# Patient Record
Sex: Female | Born: 1955 | Race: White | Hispanic: No | Marital: Married | Smoking: Never smoker
Health system: Southern US, Community
[De-identification: ages and names within clinical notes are randomized; demographics above are authoritative.]

## PROBLEM LIST (undated history)

## (undated) ENCOUNTER — Ambulatory Visit: Discharge status: Absent without leave | Payer: Federal, State, Local not specified - PPO

## (undated) DIAGNOSIS — I10 Essential (primary) hypertension: Secondary | ICD-10-CM

## (undated) DIAGNOSIS — R011 Cardiac murmur, unspecified: Secondary | ICD-10-CM

## (undated) DIAGNOSIS — E079 Disorder of thyroid, unspecified: Secondary | ICD-10-CM

## (undated) DIAGNOSIS — K219 Gastro-esophageal reflux disease without esophagitis: Secondary | ICD-10-CM

## (undated) DIAGNOSIS — T7840XA Allergy, unspecified, initial encounter: Secondary | ICD-10-CM

## (undated) DIAGNOSIS — K449 Diaphragmatic hernia without obstruction or gangrene: Secondary | ICD-10-CM

## (undated) DIAGNOSIS — F32A Depression, unspecified: Secondary | ICD-10-CM

## (undated) DIAGNOSIS — N393 Stress incontinence (female) (male): Secondary | ICD-10-CM

## (undated) HISTORY — DX: Gastro-esophageal reflux disease without esophagitis: K21.9

## (undated) HISTORY — DX: Allergy, unspecified, initial encounter: T78.40XA

## (undated) HISTORY — DX: Diaphragmatic hernia without obstruction or gangrene: K44.9

## (undated) HISTORY — PX: TUBAL LIGATION: SHX77

## (undated) HISTORY — DX: Cardiac murmur, unspecified: R01.1

## (undated) HISTORY — PX: OTHER SURGICAL HISTORY: SHX169

## (undated) HISTORY — DX: Essential (primary) hypertension: I10

## (undated) HISTORY — DX: Depression, unspecified: F32.A

## (undated) HISTORY — DX: Disorder of thyroid, unspecified: E07.9

---

## 2001-04-21 HISTORY — PX: ABDOMINAL HYSTERECTOMY: SHX81

## 2001-09-27 DIAGNOSIS — Z90711 Acquired absence of uterus with remaining cervical stump: Secondary | ICD-10-CM | POA: Insufficient documentation

## 2012-01-08 DIAGNOSIS — G4482 Headache associated with sexual activity: Secondary | ICD-10-CM | POA: Insufficient documentation

## 2012-01-08 DIAGNOSIS — K219 Gastro-esophageal reflux disease without esophagitis: Secondary | ICD-10-CM | POA: Insufficient documentation

## 2012-01-08 DIAGNOSIS — N959 Unspecified menopausal and perimenopausal disorder: Secondary | ICD-10-CM | POA: Insufficient documentation

## 2012-01-08 DIAGNOSIS — R1013 Epigastric pain: Secondary | ICD-10-CM | POA: Insufficient documentation

## 2012-01-08 DIAGNOSIS — M159 Polyosteoarthritis, unspecified: Secondary | ICD-10-CM | POA: Insufficient documentation

## 2013-07-20 DIAGNOSIS — R350 Frequency of micturition: Secondary | ICD-10-CM | POA: Insufficient documentation

## 2014-01-05 LAB — CBC WITH AUTOMATED DIFF
ABS. BASOPHILS: 0 10*3/uL (ref 0.0–0.06)
ABS. EOSINOPHILS: 0.1 10*3/uL (ref 0.0–0.4)
ABS. LYMPHOCYTES: 2.2 10*3/uL (ref 0.9–3.6)
ABS. MONOCYTES: 0.5 10*3/uL (ref 0.05–1.2)
ABS. NEUTROPHILS: 2.3 10*3/uL (ref 1.8–8.0)
BASOPHILS: 1 % (ref 0–2)
EOSINOPHILS: 3 % (ref 0–5)
HCT: 38.7 % (ref 35.0–45.0)
HGB: 12.9 g/dL (ref 12.0–16.0)
LYMPHOCYTES: 42 % (ref 21–52)
MCH: 31.4 PG (ref 24.0–34.0)
MCHC: 33.3 g/dL (ref 31.0–37.0)
MCV: 94.2 FL (ref 74.0–97.0)
MONOCYTES: 9 % (ref 3–10)
MPV: 10.4 FL (ref 9.2–11.8)
NEUTROPHILS: 45 % (ref 40–73)
PLATELET: 233 10*3/uL (ref 135–420)
RBC: 4.11 M/uL — ABNORMAL LOW (ref 4.20–5.30)
RDW: 12.8 % (ref 11.6–14.5)
WBC: 5.1 10*3/uL (ref 4.6–13.2)

## 2014-01-05 LAB — METABOLIC PANEL, COMPREHENSIVE
A-G Ratio: 1.3 (ref 0.8–1.7)
ALT (SGPT): 27 U/L (ref 13–56)
AST (SGOT): 16 U/L (ref 15–37)
Albumin: 3.8 g/dL (ref 3.4–5.0)
Alk. phosphatase: 76 U/L (ref 45–117)
Anion gap: 8 mmol/L (ref 3.0–18)
BUN/Creatinine ratio: 15 (ref 12–20)
BUN: 14 MG/DL (ref 7.0–18)
Bilirubin, total: 0.5 MG/DL (ref 0.2–1.0)
CO2: 30 mmol/L (ref 21–32)
Calcium: 9 MG/DL (ref 8.5–10.1)
Chloride: 103 mmol/L (ref 100–108)
Creatinine: 0.91 MG/DL (ref 0.6–1.3)
GFR est AA: >60 mL/min/{1.73_m2} (ref >60)
GFR est non-AA: >60 mL/min/{1.73_m2} (ref >60)
Globulin: 3 g/dL (ref 2.0–4.0)
Glucose: 88 mg/dL (ref 74–99)
Potassium: 4.7 mmol/L (ref 3.5–5.5)
Protein, total: 6.8 g/dL (ref 6.4–8.2)
Sodium: 141 mmol/L (ref 136–145)

## 2014-01-05 NOTE — Other (Signed)
Sage givenDenies any prostheticsPatient states that the family physician is not aware of upcoming procedureDenies sleep apneaDenies family history of anesthesia complicationsDenies shortness of breath nor chest pain while climbing stairs

## 2014-01-13 NOTE — H&P (View-Only) (Signed)
This 58 year old female presents for incontinence.      History of Present Illness:  1.  incontinence   here for problems with incontience, recently went to PCP and was told she had infection, since taking antibotics leaking has improved. States she is still having leaking when walking or excercising. She has had multiple treatments for UTI, but two of the cultures were negative.                Gynecologic History:  Patient is postmenopausal.   Postmenopausal age: 58. Type of menopause is hysterectomy w/BSO .  Date of last PAP: 12/29/2012.   Past Systemic History    Medical History (Detailed)  Disease Onset Date Comments   neck surgery     shoulder surgery       Surgical History/Management (Detailed)  Management Date Comments   Hysterectomy     Diagnostics History:  ActStatus Study Ordered Completed Interpretation  Result / Report   completed * Dexa, Bone Density Scan  05/04/2007 abnormal  Osteopenia   completed * Screening mammography digital  07/11/2013 normal     completed COLONOSCOPY AND BIOPSY  01/29/2009 low  repeat in 5 years   completed MAMMOGRAM, BOTH BREASTS  07/09/2010 normal     completed VISUAL ACUITY SCREEN  07/07/2012 normal     obtained Wet prep / KOH 10/28/2013             PROBLEM LIST:    Problem Description Onset Date   Increased frequency of urination 07/20/2013   Pure hypercholesterolemia 01/08/2012   Gastroesophageal reflux disease 01/08/2012   Degenerative joint disease involving multiple joints 01/08/2012   Epigastric pain 01/08/2012   Menopausal and postmenopausal disorders 01/08/2012   Joint pain 01/08/2012   Palpitations 01/08/2012   Headache associated with sexual activity 01/08/2012       Medication Reconciliation  Medications reconciled today.    Allergies:  Ingredient Reaction Medication Name Comment   NO KNOWN ALLERGIES          Family History  (Detailed)    Relationship Family Member Name Deceased Age at Death Condition Onset Age Cause of Death   Father  Y  Heart disease  Y    Father  Y    N   Mother  Y  Hypertension  Y   Mother  Y    N   Social History:  (Detailed)  The patient is right-handed.    Preferred language is English.     The patient does not need an interpreter.    Tobacco use status: Never smoked tobacco.  Smoking status: Never smoker.          Review of Systems  System Neg/Pos Details   Constitutional Negative Fever.   Respiratory Negative Dyspnea.   Cardio Negative Chest pain.   GI Negative Abdominal pain.   GU Positive Urinary frequency, Urinary incontinence.   GU Negative Dysuria and hematuria.   Endocrine Negative Cold intolerance and heat intolerance.   Neuro Negative Headache.   Psych Negative Anxiety and depression.         Vital Signs       Time BP mm/Hg Pulse /min Resp /min Temp F Ht ft Ht in Ht cm Wt lb Wt kg BMI kg/m2 BSA m2 O2 Sat%   10:49 AM 122/76    5.0 7.00 170.18 179.00 81.193 28.04       Measured By  Time Measured by   10:49 AM Sarah Mazzone           Physical Exam  Exam Findings Details   Constitutional Normal Well developed.   Respiratory Normal Inspection - Normal.   Abdomen Normal Anterior palpation -  No rebound. No abdominal tenderness. No hernia.   Genitourinary * Urethral hypermobility - The cotton swab rises 10 degrees at rest, straining the swab rises 40 degrees from resting angle. The stress test is positive. The tests were performed with the bladder full. Cervix - surgically absent. Uterus - surgically absent.   Genitourinary Normal Urethral meatus - Normal. Urethra - Normal. External genitalia - Normal. Glands - Normal. Perineum - Normal. Anus - Normal. Pubic hair - Normal. Vagina - Normal. Adnexa - Normal. Bladder - Normal. No suprapubic tenderness. No CVA tenderness. No vaginal discharge.   Skin Normal Inspection - Normal.   Spine * Inspection - no abnormality.   Psychiatric Normal Oriented to time, place, person and situation. Appropriate mood and affect.       Assessment/Plan  # Detail Type Description     1. Assessment Menopausal or female climacteric states (627.2).    Patient Plan Patient wants to go back on ERT. ERX sent.         2. Assessment Stress incontinence, female (625.6).    Patient Plan Hypermobile urethra. + leak, - culture. Will schedule for TVT-O.   Discussed risks benefits alternatives of surgery and patient wants to proceed.         3. Assessment Urinary frequency (788.41).    Patient Plan Continue oxybutnin.

## 2014-01-13 NOTE — H&P (Signed)
This 58 year old female presents for incontinence.      History of Present Illness:  1.  incontinence   here for problems with incontience, recently went to PCP and was told she had infection, since taking antibotics leaking has improved. States she is still having leaking when walking or excercising. She has had multiple treatments for UTI, but two of the cultures were negative.                Gynecologic History:  Patient is postmenopausal.   Postmenopausal age: 58. Type of menopause is hysterectomy w/BSO .  Date of last PAP: 12/29/2012.   Past Systemic History    Medical History (Detailed)  Disease Onset Date Comments   neck surgery     shoulder surgery       Surgical History/Management (Detailed)  Management Date Comments   Hysterectomy     Diagnostics History:  ActStatus Study Ordered Completed Interpretation  Result / Report   completed * Dexa, Bone Density Scan  05/04/2007 abnormal  Osteopenia   completed * Screening mammography digital  07/11/2013 normal     completed COLONOSCOPY AND BIOPSY  01/29/2009 low  repeat in 5 years   completed MAMMOGRAM, BOTH BREASTS  07/09/2010 normal     completed VISUAL ACUITY SCREEN  07/07/2012 normal     obtained Wet prep / KOH 10/28/2013             PROBLEM LIST:    Problem Description Onset Date   Increased frequency of urination 07/20/2013   Pure hypercholesterolemia 01/08/2012   Gastroesophageal reflux disease 01/08/2012   Degenerative joint disease involving multiple joints 01/08/2012   Epigastric pain 01/08/2012   Menopausal and postmenopausal disorders 01/08/2012   Joint pain 01/08/2012   Palpitations 01/08/2012   Headache associated with sexual activity 01/08/2012       Medication Reconciliation  Medications reconciled today.    Allergies:  Ingredient Reaction Medication Name Comment   NO KNOWN ALLERGIES          Family History  (Detailed)    Relationship Family Member Name Deceased Age at Death Condition Onset Age Cause of Death   Father  Y  Heart disease  Y    Father  Y    N   Mother  Y  Hypertension  Y   Mother  Y    N   Social History:  (Detailed)  The patient is right-handed.    Preferred language is [place].     The patient does not need an interpreter.    Tobacco use status: Never smoked tobacco.  Smoking status: Never smoker.          Review of Systems  System Neg/Pos Details   Constitutional Negative Fever.   Respiratory Negative Dyspnea.   Cardio Negative Chest pain.   GI Negative Abdominal pain.   GU Positive Urinary frequency, Urinary incontinence.   GU Negative Dysuria and hematuria.   Endocrine Negative Cold intolerance and heat intolerance.   Neuro Negative Headache.   Psych Negative Anxiety and depression.         Vital Signs       Time BP mm/Hg Pulse /min Resp /min Temp F Ht ft Ht in Ht cm Wt lb Wt kg BMI kg/m2 BSA m2 O2 Sat%   10:49 AM 122/76    5.0 7.00 170.18 179.00 81.193 28.04       Measured By  Time Measured by   10:49 AM Foye Clock  Physical Exam  Exam Findings Details   Constitutional Normal Well developed.   Respiratory Normal Inspection - Normal.   Abdomen Normal Anterior palpation -  No rebound. No abdominal tenderness. No hernia.   Genitourinary * Urethral hypermobility - The cotton swab rises 10 degrees at rest, straining the swab rises 40 degrees from resting angle. The stress test is positive. The tests were performed with the bladder full. Cervix - surgically absent. Uterus - surgically absent.   Genitourinary Normal Urethral meatus - Normal. Urethra - Normal. External genitalia - Normal. Glands - Normal. Perineum - Normal. Anus - Normal. Pubic hair - Normal. Vagina - Normal. Adnexa - Normal. Bladder - Normal. No suprapubic tenderness. No CVA tenderness. No vaginal discharge.   Skin Normal Inspection - Normal.   Spine * Inspection - no abnormality.   Psychiatric Normal Oriented to time, place, person and situation. Appropriate mood and affect.       Assessment/Plan  # Detail Type Description     1. Assessment Menopausal or female climacteric states (627.2).    Patient Plan Patient wants to go back on ERT. ERX sent.         2. Assessment Stress incontinence, female (625.6).    Patient Plan Hypermobile urethra. + leak, - culture. Will schedule for TVT-O.   Discussed risks benefits alternatives of surgery and patient wants to proceed.         3. Assessment Urinary frequency (788.41).    Patient Plan Continue oxybutnin.

## 2014-01-16 ENCOUNTER — Inpatient Hospital Stay: Payer: BLUE CROSS/BLUE SHIELD

## 2014-01-16 MED ORDER — NALOXONE 0.4 MG/ML INJECTION
0.4 mg/mL | INTRAMUSCULAR | Status: DC | PRN
Start: 2014-01-16 — End: 2014-01-16

## 2014-01-16 MED ORDER — KETAMINE 10 MG/ML IJ SOLN
10 mg/mL | INTRAMUSCULAR | Status: DC | PRN
Start: 2014-01-16 — End: 2014-01-16
  Administered 2014-01-16: 15:00:00 via INTRAVENOUS

## 2014-01-16 MED ORDER — BUPIVACAINE (PF) 0.25 % (2.5 MG/ML) IJ SOLN
0.25 % (2.5 mg/mL) | INTRAMUSCULAR | Status: AC
Start: 2014-01-16 — End: ?

## 2014-01-16 MED ORDER — OXYCODONE-ACETAMINOPHEN 5 MG-325 MG TAB
5-325 mg | ORAL_TABLET | ORAL | Status: AC | PRN
Start: 2014-01-16 — End: ?

## 2014-01-16 MED ORDER — CAFFEINE 200 MG TAB
200 mg | Freq: Once | ORAL | Status: AC
Start: 2014-01-16 — End: 2014-01-16
  Administered 2014-01-16: 14:00:00 via ORAL

## 2014-01-16 MED ORDER — FENTANYL CITRATE (PF) 50 MCG/ML IJ SOLN
50 mcg/mL | INTRAMUSCULAR | Status: DC | PRN
Start: 2014-01-16 — End: 2014-01-16
  Administered 2014-01-16: 15:00:00 via INTRAVENOUS

## 2014-01-16 MED ORDER — PROPOFOL 10 MG/ML IV EMUL
10 mg/mL | INTRAVENOUS | Status: DC | PRN
Start: 2014-01-16 — End: 2014-01-16
  Administered 2014-01-16 (×3): via INTRAVENOUS

## 2014-01-16 MED ORDER — ACETAMINOPHEN 1,000 MG/100 ML (10 MG/ML) IV
1000 mg/100 mL (10 mg/mL) | Freq: Once | INTRAVENOUS | Status: DC | PRN
Start: 2014-01-16 — End: 2014-01-16

## 2014-01-16 MED ORDER — SODIUM CHLORIDE 0.9 % IV
INTRAVENOUS | Status: DC
Start: 2014-01-16 — End: 2014-01-16

## 2014-01-16 MED ORDER — LIDOCAINE (PF) 20 MG/ML (2 %) IJ SOLN
20 mg/mL (2 %) | INTRAMUSCULAR | Status: DC | PRN
Start: 2014-01-16 — End: 2014-01-16
  Administered 2014-01-16: 15:00:00 via INTRAVENOUS

## 2014-01-16 MED ORDER — FENTANYL CITRATE (PF) 50 MCG/ML IJ SOLN
50 mcg/mL | INTRAMUSCULAR | Status: DC | PRN
Start: 2014-01-16 — End: 2014-01-16

## 2014-01-16 MED ORDER — DEXAMETHASONE SODIUM PHOSPHATE 4 MG/ML IJ SOLN
4 mg/mL | INTRAMUSCULAR | Status: DC | PRN
Start: 2014-01-16 — End: 2014-01-16
  Administered 2014-01-16: 15:00:00 via INTRAVENOUS

## 2014-01-16 MED ORDER — BUPIVACAINE (PF) 0.25 % (2.5 MG/ML) IJ SOLN
0.25 % (2.5 mg/mL) | INTRAMUSCULAR | Status: DC | PRN
Start: 2014-01-16 — End: 2014-01-16
  Administered 2014-01-16: 16:00:00

## 2014-01-16 MED ORDER — CEFAZOLIN 2 GM/50 ML IN DEXTROSE (ISO-OSMOTIC) IVPB
2 gram/50 mL | Freq: Once | INTRAVENOUS | Status: AC
Start: 2014-01-16 — End: 2014-01-16
  Administered 2014-01-16: 15:00:00 via INTRAVENOUS

## 2014-01-16 MED ORDER — ONDANSETRON (PF) 4 MG/2 ML INJECTION
4 mg/2 mL | INTRAMUSCULAR | Status: DC | PRN
Start: 2014-01-16 — End: 2014-01-16
  Administered 2014-01-16: 15:00:00 via INTRAVENOUS

## 2014-01-16 MED ORDER — MIDAZOLAM 1 MG/ML IJ SOLN
1 mg/mL | INTRAMUSCULAR | Status: AC
Start: 2014-01-16 — End: ?

## 2014-01-16 MED ORDER — INSULIN LISPRO 100 UNIT/ML INJECTION
100 unit/mL | SUBCUTANEOUS | Status: DC
Start: 2014-01-16 — End: 2014-01-16

## 2014-01-16 MED ORDER — DEXAMETHASONE SODIUM PHOSPHATE 4 MG/ML IJ SOLN
4 mg/mL | Freq: Once | INTRAMUSCULAR | Status: AC
Start: 2014-01-16 — End: 2014-01-16
  Administered 2014-01-16: 14:00:00 via INTRAVENOUS

## 2014-01-16 MED ORDER — LACTATED RINGERS IV
INTRAVENOUS | Status: DC
Start: 2014-01-16 — End: 2014-01-16
  Administered 2014-01-16 (×2): via INTRAVENOUS

## 2014-01-16 MED ORDER — KETAMINE 50 MG/5 ML (10 MG/ML) IN NS IV SYRINGE
50 mg/5 mL (10 mg/mL) | INTRAVENOUS | Status: AC
Start: 2014-01-16 — End: ?

## 2014-01-16 MED ORDER — POLYETHYLENE GLYCOL 3350 17 GRAM (100 %) ORAL POWDER PACKET
17 gram | PACK | Freq: Every day | ORAL | Status: AC
Start: 2014-01-16 — End: ?

## 2014-01-16 MED ORDER — MIDAZOLAM 1 MG/ML IJ SOLN
1 mg/mL | INTRAMUSCULAR | Status: DC | PRN
Start: 2014-01-16 — End: 2014-01-16
  Administered 2014-01-16: 15:00:00 via INTRAVENOUS

## 2014-01-16 MED ORDER — GLYCOPYRROLATE 0.2 MG/ML IJ SOLN
0.2 mg/mL | INTRAMUSCULAR | Status: DC | PRN
Start: 2014-01-16 — End: 2014-01-16
  Administered 2014-01-16: 15:00:00 via INTRAVENOUS

## 2014-01-16 MED ORDER — FENTANYL CITRATE (PF) 50 MCG/ML IJ SOLN
50 mcg/mL | INTRAMUSCULAR | Status: AC
Start: 2014-01-16 — End: ?

## 2014-01-16 MED ORDER — HYDROMORPHONE 2 MG/ML INJECTION SOLUTION
2 mg/mL | INTRAMUSCULAR | Status: DC | PRN
Start: 2014-01-16 — End: 2014-01-16

## 2014-01-16 MED ORDER — IBUPROFEN 800 MG TAB
800 mg | ORAL_TABLET | Freq: Three times a day (TID) | ORAL | Status: AC | PRN
Start: 2014-01-16 — End: ?

## 2014-01-16 MED ORDER — DOCUSATE SODIUM 100 MG CAP
100 mg | ORAL_CAPSULE | Freq: Two times a day (BID) | ORAL | Status: AC
Start: 2014-01-16 — End: 2014-04-16

## 2014-01-16 MED ORDER — METOCLOPRAMIDE 5 MG/ML IJ SOLN
5 mg/mL | INTRAMUSCULAR | Status: DC | PRN
Start: 2014-01-16 — End: 2014-01-16
  Administered 2014-01-16: 16:00:00 via INTRAVENOUS

## 2014-01-16 MED ORDER — SODIUM CHLORIDE 0.9 % IJ SYRG
INTRAMUSCULAR | Status: DC | PRN
Start: 2014-01-16 — End: 2014-01-16

## 2014-01-16 NOTE — Other (Signed)
TRANSFER - IN REPORT:    Verbal report received from Joe CRNA and Gladstone Lighterobin Taylor RN on Renee RamusJacqueline D Mcelmurry  being received from OR for routine progression of care      Report consisted of patient???s Situation, Background, Assessment and   Recommendations(SBAR).     Information from the following report(s) OR Summary, Procedure Summary and Intake/Output was reviewed with the receiving nurse.    Opportunity for questions and clarification was provided.      Assessment completed upon patient???s arrival to unit and care assumed.

## 2014-01-16 NOTE — Other (Signed)
Patient's husband updated by Ezequiel GanserKellie Caudle RN, and sent down stairs to Phase II.

## 2014-01-16 NOTE — Other (Signed)
Pt. Used restroom in pre-op area.

## 2014-01-16 NOTE — Other (Signed)
TRANSFER - OUT REPORT:    Verbal report given to [name] on Natalie Ryan  being transferred to Phase II for routine progression of care       Report consisted of patient???s Situation, Background, Assessment and   Recommendations(SBAR).     Information from the following report(s) OR Summary, Procedure Summary and Intake/Output was reviewed with the receiving nurse.    Lines:   Peripheral IV 01/16/14 Left Hand (Active)   Site Assessment Clean, dry, & intact 01/16/2014 12:22 PM   Phlebitis Assessment 0 01/16/2014 12:22 PM   Infiltration Assessment 0 01/16/2014 12:22 PM   Dressing Status Clean, dry, & intact 01/16/2014 12:22 PM   Dressing Type Tape;Transparent 01/16/2014 12:22 PM   Hub Color/Line Status Pink;Infusing 01/16/2014 12:22 PM        Opportunity for questions and clarification was provided.      Patient transported with:   [employer]

## 2014-01-16 NOTE — Anesthesia Post-Procedure Evaluation (Signed)
Post-Anesthesia Evaluation and Assessment    Cardiovascular Function/Vital Signs  Visit Vitals   Item Reading   ??? BP 131/74 mmHg   ??? Pulse 52   ??? Temp 36.2 ??C (97.1 ??F)   ??? Resp 13   ??? Ht _0  (1.702 m)   ??? Wt 80.06 kg (176 lb 8 oz)   ??? BMI 27.64 kg/m2   ??? SpO2 100%       Patient is status post Procedure(s):  TENSION FREE VAGINAL TAPING-0.    Nausea/Vomiting: Controlled.    Postoperative hydration reviewed and adequate.    Pain:  Pain Scale 1: Numeric (0 - 10) (01/16/14 1216)  Pain Intensity 1: 0 (01/16/14 1216)   Managed.    Neurological Status:   Neuro (WDL): Within Defined Limits (01/16/14 1216)   At baseline.    Mental Status and Level of Consciousness: Arousable.    Pulmonary Status:   O2 Device: Room air (01/16/14 1216)   Adequate oxygenation and airway patent.    Complications related to anesthesia: None    Post-anesthesia assessment completed. No concerns.    Patient has met all discharge requirements.    Signed By: Everett Graff, MD    January 16, 2014

## 2014-01-16 NOTE — Op Note (Signed)
PREOPERATIVE DIAGNOSIS  Stress urinary incontinence, Hypermobile urethra    POSTOPERATIVE DIAGNOSIS  Stress urinary incontinence, Hypermobile urethra    PROCEDURE(S) PERFORMED  Transobturator urethral sling (TVT-O)                              ANESTHESIA  General with LMA.    FINDINGS  Hypermobile urethra    COMPLICATIONS  None.    ESTIMATED BLOOD LOSS  Minimal.    SPECIMENS  None    PROCEDURE  The patient was taken to the operating room where she was placed in the supine position. After being placed under general anesthesia, she was placed up in the [facility] [place] laparoscopy stirrups in the dorsal lithotomy position .The patient was then prepped and draped in the usual fashion. Time out was completed. Attention was turned to the vagina where the speculum was placed in the vagina. The uretheral orifice was grasped with an Allis clamp. Marcaine was injected in the midline immediately beneath the urethral orifice. A vertical incision was made in the midline beneath the urethrovesical junction. The right side of the incision was sharply dissected beneath the urethra towards the right obturator foramen. The TVT-O guide was placed through the obturator foramen. The TVT-O trocar was placed into the incision on top of the guide and the guide was removed. The trocar was brought through the skin of the groin and the sheath was held while the trocar was removed. The TVT-O mesh was brought to the midline. The same procedure was used on the left. A Mayo scissors was placed beneath the sheath and the mesh was brought into the proper position. The trocars were cut from the the mesh and the mesh sheath was removed. The mesh arms were cut at the skin edges. The vaginal incision was closed with 3-0 Vicryl running. All instruments were removed from the vagina. Excellent hemostasis was noted. The skin incisions were closed with Dermabond. The patient was awakened, extubated and taken to the recovery room in stable condition.

## 2014-01-16 NOTE — Interval H&P Note (Signed)
H&P Update:  Natalie Ryan was seen and examined.  History and physical has been reviewed. There have been no significant clinical changes since the completion of the originally dated History and Physical.    Signed By: Barbee CoughJEFFREY D Aspynn Clover, MD     January 16, 2014 10:38 AM

## 2014-01-16 NOTE — Other (Signed)
Notified Cari C. rn that there is no order for consent.

## 2014-01-16 NOTE — Anesthesia Pre-Procedure Evaluation (Addendum)
Anesthetic History     PONV          Review of Systems / Medical History  Patient summary reviewed, nursing notes reviewed and pertinent labs reviewed    Pulmonary  Within defined limits            Pertinent negatives: No asthma and smoker     Neuro/Psych         Psychiatric history     Cardiovascular  Within defined limits              Pertinent negatives: No hypertension       GI/Hepatic/Renal     GERD: well controlled        Pertinent negatives: No liver disease and renal disease   Endo/Other  Within defined limits        Pertinent negatives: No diabetes   Other Findings            Physical Exam    Airway  Mallampati: II  TM Distance: < 4 cm  Neck ROM: normal range of motion   Mouth opening: Normal     Cardiovascular  Regular rate and rhythm,  S1 and S2 normal,  no murmur, click, rub, or gallop  Rhythm: regular  Rate: normal         Dental    Dentition: Caps/crowns     Pulmonary  Breath sounds clear to auscultation               Abdominal         Other Findings            Anesthetic Plan    ASA: 1  Anesthesia type: general          Induction: Intravenous  Anesthetic plan and risks discussed with: Patient

## 2014-01-16 NOTE — Other (Signed)
Voided 600 ml pink tinged urine

## 2014-01-16 NOTE — Op Note (Signed)
PREOPERATIVE DIAGNOSIS  Stress urinary incontinence, Hypermobile urethra    POSTOPERATIVE DIAGNOSIS  Stress urinary incontinence, Hypermobile urethra    PROCEDURE(S) PERFORMED  Transobturator urethral sling (TVT-O)                              ANESTHESIA  General with LMA.    FINDINGS  Hypermobile urethra    COMPLICATIONS  None.    ESTIMATED BLOOD LOSS  Minimal.    SPECIMENS  None    PROCEDURE  The patient was taken to the operating room where she was placed in the supine position. After being placed under general anesthesia, she was placed up in the [place] laparoscopy stirrups in the dorsal lithotomy position .The patient was then prepped and draped in the usual fashion. Time out was completed. Attention was turned to the vagina where the speculum was placed in the vagina. The uretheral orifice was grasped with an Allis clamp. Marcaine was injected in the midline immediately beneath the urethral orifice. A vertical incision was made in the midline beneath the urethrovesical junction. The right side of the incision was sharply dissected beneath the urethra towards the right obturator foramen. The TVT-O guide was placed through the obturator foramen. The TVT-O trocar was placed into the incision on top of the guide and the guide was removed. The trocar was brought through the skin of the groin and the sheath was held while the trocar was removed. The TVT-O mesh was brought to the midline. The same procedure was used on the left. A Mayo scissors was placed beneath the sheath and the mesh was brought into the proper position. The trocars were cut from the the mesh and the mesh sheath was removed. The mesh arms were cut at the skin edges. The vaginal incision was closed with 3-0 Vicryl running. All instruments were removed from the vagina. Excellent hemostasis was noted. The skin incisions were closed with Dermabond. The patient was awakened, extubated and taken to the recovery room in stable condition.

## 2014-01-17 MED FILL — DEXAMETHASONE SODIUM PHOSPHATE 4 MG/ML IJ SOLN: 4 mg/mL | INTRAMUSCULAR | Qty: 2

## 2015-07-05 DIAGNOSIS — H5213 Myopia, bilateral: Secondary | ICD-10-CM | POA: Insufficient documentation

## 2015-09-27 DIAGNOSIS — E041 Nontoxic single thyroid nodule: Secondary | ICD-10-CM | POA: Insufficient documentation

## 2015-09-27 HISTORY — DX: Nontoxic single thyroid nodule: E04.1

## 2016-01-10 DIAGNOSIS — R1313 Dysphagia, pharyngeal phase: Secondary | ICD-10-CM | POA: Insufficient documentation

## 2016-07-07 DIAGNOSIS — H524 Presbyopia: Secondary | ICD-10-CM | POA: Insufficient documentation

## 2016-10-19 HISTORY — PX: THYROIDECTOMY, PARTIAL: SHX18

## 2016-11-18 DIAGNOSIS — E890 Postprocedural hypothyroidism: Secondary | ICD-10-CM | POA: Insufficient documentation

## 2018-02-03 DIAGNOSIS — E890 Postprocedural hypothyroidism: Secondary | ICD-10-CM | POA: Insufficient documentation

## 2019-02-20 HISTORY — PX: SHOULDER SURGERY: SHX246

## 2019-04-22 HISTORY — PX: COLONOSCOPY: SHX174

## 2020-01-10 ENCOUNTER — Ambulatory Visit (INDEPENDENT_AMBULATORY_CARE_PROVIDER_SITE_OTHER): Payer: BLUE CROSS/BLUE SHIELD | Admitting: Internal Medicine

## 2020-01-10 ENCOUNTER — Other Ambulatory Visit: Payer: Self-pay

## 2020-01-10 ENCOUNTER — Encounter: Payer: Self-pay | Admitting: Internal Medicine

## 2020-01-10 VITALS — BP 122/78 | HR 65 | Temp 97.3°F | Resp 18 | Ht 67.0 in | Wt 192.4 lb

## 2020-01-10 DIAGNOSIS — Z9009 Acquired absence of other part of head and neck: Secondary | ICD-10-CM

## 2020-01-10 DIAGNOSIS — F3341 Major depressive disorder, recurrent, in partial remission: Secondary | ICD-10-CM | POA: Insufficient documentation

## 2020-01-10 DIAGNOSIS — Z780 Asymptomatic menopausal state: Secondary | ICD-10-CM

## 2020-01-10 DIAGNOSIS — E890 Postprocedural hypothyroidism: Secondary | ICD-10-CM

## 2020-01-10 DIAGNOSIS — E669 Obesity, unspecified: Secondary | ICD-10-CM | POA: Insufficient documentation

## 2020-01-10 DIAGNOSIS — Z23 Encounter for immunization: Secondary | ICD-10-CM | POA: Diagnosis not present

## 2020-01-10 DIAGNOSIS — R232 Flushing: Secondary | ICD-10-CM

## 2020-01-10 DIAGNOSIS — F329 Major depressive disorder, single episode, unspecified: Secondary | ICD-10-CM

## 2020-01-10 DIAGNOSIS — Z7689 Persons encountering health services in other specified circumstances: Secondary | ICD-10-CM | POA: Diagnosis not present

## 2020-01-10 DIAGNOSIS — F32A Depression, unspecified: Secondary | ICD-10-CM

## 2020-01-10 DIAGNOSIS — K219 Gastro-esophageal reflux disease without esophagitis: Secondary | ICD-10-CM

## 2020-01-10 DIAGNOSIS — F320 Major depressive disorder, single episode, mild: Secondary | ICD-10-CM | POA: Diagnosis not present

## 2020-01-10 MED ORDER — FLUOXETINE HCL 20 MG PO CAPS: 20.0000 mg | ORAL_CAPSULE | Freq: Every day | ORAL | 3 refills | Status: DC

## 2020-01-10 MED ORDER — LEVOTHYROXINE SODIUM 75 MCG PO TABS: 75.0000 ug | ORAL_TABLET | Freq: Every day | ORAL | 3 refills | Status: DC

## 2020-01-10 NOTE — Assessment & Plan Note (Addendum)
Due to h/o multinodular goiter Takes Levothyroxine 75 mcg QD Reviewed labs from 08/2019, TSH wnl Will recheck TSH and T4

## 2020-01-10 NOTE — Assessment & Plan Note (Signed)
Takes Omeprazole Advised to avoid PPI, start Pepcid

## 2020-01-10 NOTE — Assessment & Plan Note (Signed)
Diet modification advised Moderate exercise and plan for activity modification discussed 

## 2020-01-10 NOTE — Assessment & Plan Note (Signed)
Uses estradiol patch for long time 

## 2020-01-10 NOTE — Patient Instructions (Signed)
Please continue to take medications as prescribed.  Please get blood tests done and follow up in 4 months.  Please follow low-carb and low cholesterol diet as discussed. Moderate exercise as tolerated.

## 2020-01-10 NOTE — Assessment & Plan Note (Signed)
Well-controlled with Fluoxetine 

## 2020-01-10 NOTE — Assessment & Plan Note (Addendum)
Care established History and medications reviewed DEXA scan ordered

## 2020-01-10 NOTE — Progress Notes (Signed)
New Patient Office Visit  Subjective:  Patient ID: Molly Knight, female    DOB: 08-04-1955  Age: 64 y.o. MRN: [id number]  CC:  Chief Complaint  Patient presents with  . New Patient (Initial Visit)    new patient former pcp in [place] dr Maralyn Sago young only concern is getting her thyroid medication refilled and labs rechecked     HPI Molly Knight is a 64 year old female who presents for establishing care.  Patient has a PCP in [place], last visit about 4 months ago. Patient has moved to this area now. Today, patient denies any active complaints and states that she is tolerating her medications well.  Patient has a history of partial thyroidectomy for multinodular goiter long time ago, after which she has been taking levothyroxine 75 mcg daily.  Patient's sister had colon cancer at age 80.  Patient gets colonoscopy every 5 years, last colonoscopy was done in 2021.  Patient had a normal mammogram in 06/2018.  Patient denies having DEXA scan for long time.  Patient is estradiol patch for hot flashes.  History reviewed. No pertinent past medical history.  Past Surgical History:  Procedure Laterality Date  . ABDOMINAL HYSTERECTOMY  2003  . SHOULDER SURGERY  02/2019  . THYROIDECTOMY, PARTIAL  10/2016    Family History  Problem Relation Age of Onset  . Aortic aneurysm Mother   . Colon cancer Sister   . Throat cancer Brother     Social History   Socioeconomic History  . Marital status: Married    Spouse name: Not on file  . Number of children: Not on file  . Years of education: Not on file  . Highest education level: Not on file  Occupational History  . Not on file  Tobacco Use  . Smoking status: Never Smoker  . Smokeless tobacco: Never Used  Substance and Sexual Activity  . Alcohol use: Never  . Drug use: Never  . Sexual activity: Not on file  Other Topics Concern  . Not on file  Social History Narrative  . Not on file   Social Determinants of Health    Financial Resource Strain: Low Risk   . Difficulty of Paying Living Expenses: Not hard at all  Food Insecurity: No Food Insecurity  . Worried About Programme researcher, broadcasting/film/video in the Last Year: Never true  . Ran Out of Food in the Last Year: Never true  Transportation Needs: No Transportation Needs  . Lack of Transportation (Medical): No  . Lack of Transportation (Non-Medical): No  Physical Activity: Insufficiently Active  . Days of Exercise per Week: 3 days  . Minutes of Exercise per Session: 30 min  Stress: No Stress Concern Present  . Feeling of Stress : Not at all  Social Connections: Moderately Isolated  . Frequency of Communication with Friends and Family: More than three times a week  . Frequency of Social Gatherings with Friends and Family: More than three times a week  . Attends Religious Services: Never  . Active Member of Clubs or Organizations: No  . Attends Banker Meetings: Never  . Marital Status: Married  Catering manager Violence: Not At Risk  . Fear of Current or Ex-Partner: No  . Emotionally Abused: No  . Physically Abused: No  . Sexually Abused: No    ROS Review of Systems  Constitutional: Negative for chills and fever.  HENT: Negative for congestion, sinus pressure, sinus pain and sore throat.   Eyes: Negative for pain and discharge.  Respiratory: Negative for cough and shortness of breath.   Cardiovascular: Negative for chest pain and palpitations.  Gastrointestinal: Negative for abdominal pain, constipation, diarrhea, nausea and vomiting.  Endocrine: Negative for polydipsia and polyuria.  Genitourinary: Negative for dysuria and hematuria.  Musculoskeletal: Negative for neck pain and neck stiffness.  Skin: Negative for rash.  Neurological: Negative for dizziness and weakness.  Psychiatric/Behavioral: Negative for agitation and behavioral problems.    Objective:   Today's Vitals: BP 122/78 (BP Location: Left Arm, Patient Position: Sitting,  Cuff Size: Normal)   Pulse 65   Temp (!) 97.3 F (36.3 C) (Temporal)   Resp 18   Ht 5\' 7"  (1.702 m)   Wt 192 lb 6.4 oz (87.3 kg)   LMP  (LMP Unknown)   SpO2 96%   BMI 30.13 kg/m   Physical Exam Vitals reviewed.  Constitutional:      General: She is not in acute distress.    Appearance: She is not diaphoretic.  HENT:     Head: Normocephalic and atraumatic.     Nose: Nose normal.     Mouth/Throat:     Mouth: Mucous membranes are moist.  Eyes:     General: No scleral icterus.    Extraocular Movements: Extraocular movements intact.     Pupils: Pupils are equal, round, and reactive to light.  Cardiovascular:     Rate and Rhythm: Normal rate and regular rhythm.     Pulses: Normal pulses.     Heart sounds: No murmur heard.   Pulmonary:     Breath sounds: Normal breath sounds. No wheezing or rales.  Abdominal:     Palpations: Abdomen is soft.     Tenderness: There is no abdominal tenderness.  Musculoskeletal:     Cervical back: Neck supple. No tenderness.     Right lower leg: No edema.     Left lower leg: No edema.  Skin:    General: Skin is warm.     Findings: No rash.  Neurological:     General: No focal deficit present.     Mental Status: She is alert and oriented to person, place, and time.  Psychiatric:        Mood and Affect: Mood normal.        Behavior: Behavior normal.     Assessment & Plan:   Encounter to establish care Care established History and medications reviewed DEXA scan ordered Diet and activity modification for healthy lifestyle discussed  History of partial thyroidectomy Due to h/o multinodular goiter Takes Levothyroxine 75 mcg QD Reviewed labs from 08/2019, TSH wnl Will recheck TSH and T4  Depression Well-controlled with Fluoxetine  GERD (gastroesophageal reflux disease) Takes Omeprazole Advised to avoid PPI, start Pepcid  Hot flashes Uses estradiol patch for long time  Obesity Diet modification advised Moderate exercise and  plan for activity modification discussed   Flu shot administered in office today   Outpatient Encounter Medications as of 01/10/2020  Medication Sig  . estradiol (VIVELLE-DOT) 0.05 MG/24HR patch 1 patch 2 (two) times a week.  01/12/2020 FLUoxetine (PROZAC) 20 MG capsule Take 1 capsule (20 mg total) by mouth daily.  Marland Kitchen levothyroxine (SYNTHROID) 75 MCG tablet Take 1 tablet (75 mcg total) by mouth daily before breakfast.  . metroNIDAZOLE (METROGEL) 1 % gel SMARTSIG:1 Topical Every Night  . Multiple Vitamins-Minerals (MULTI VITAMIN/MINERALS) TABS Take 1 tablet by mouth daily.  Marland Kitchen omeprazole (PRILOSEC) 20 MG capsule Take 20 mg by mouth daily.  . [DISCONTINUED] FLUoxetine (PROZAC) 20  MG capsule 20 mg.  . [DISCONTINUED] levothyroxine (SYNTHROID) 75 MCG tablet Take 75 mcg by mouth.   No facility-administered encounter medications on file as of 01/10/2020.    Follow-up: Return in about 4 months (around 05/11/2020).   Anabel Halon, MD

## 2020-01-11 ENCOUNTER — Telehealth: Payer: Self-pay | Admitting: Radiology

## 2020-01-11 NOTE — Telephone Encounter (Signed)
Patient called she has asked if the DEXA scan is preventative or diagnostic   She needs to know for insurance purposes

## 2020-01-12 NOTE — Telephone Encounter (Signed)
Patient was called to notify it is for diagnosis of osteoporosis.

## 2020-01-19 ENCOUNTER — Ambulatory Visit (HOSPITAL_COMMUNITY)
Admission: RE | Admit: 2020-01-19 | Discharge: 2020-01-19 | Disposition: A | Discharge status: Home / Self Care | Payer: BLUE CROSS/BLUE SHIELD | Source: Ambulatory Visit | Attending: Internal Medicine | Admitting: Internal Medicine

## 2020-01-19 ENCOUNTER — Other Ambulatory Visit: Payer: Self-pay

## 2020-01-19 DIAGNOSIS — Z780 Asymptomatic menopausal state: Secondary | ICD-10-CM | POA: Insufficient documentation

## 2020-01-19 DIAGNOSIS — M8589 Other specified disorders of bone density and structure, multiple sites: Secondary | ICD-10-CM | POA: Diagnosis not present

## 2020-04-08 ENCOUNTER — Other Ambulatory Visit: Payer: Self-pay | Admitting: Internal Medicine

## 2020-04-08 DIAGNOSIS — E890 Postprocedural hypothyroidism: Secondary | ICD-10-CM

## 2020-04-26 ENCOUNTER — Ambulatory Visit
Admission: RE | Admit: 2020-04-26 | Discharge: 2020-04-26 | Disposition: A | Discharge status: Home / Self Care | Payer: Federal, State, Local not specified - PPO | Source: Ambulatory Visit | Attending: Emergency Medicine | Admitting: Emergency Medicine

## 2020-04-26 ENCOUNTER — Ambulatory Visit (INDEPENDENT_AMBULATORY_CARE_PROVIDER_SITE_OTHER): Payer: Federal, State, Local not specified - PPO

## 2020-04-26 ENCOUNTER — Other Ambulatory Visit: Payer: Self-pay

## 2020-04-26 VITALS — BP 132/79 | HR 60 | Temp 98.2°F | Resp 19 | Ht 67.0 in | Wt 190.0 lb

## 2020-04-26 DIAGNOSIS — M25572 Pain in left ankle and joints of left foot: Secondary | ICD-10-CM

## 2020-04-26 DIAGNOSIS — W19XXXA Unspecified fall, initial encounter: Secondary | ICD-10-CM

## 2020-04-26 DIAGNOSIS — M7989 Other specified soft tissue disorders: Secondary | ICD-10-CM | POA: Diagnosis not present

## 2020-04-26 DIAGNOSIS — W108XXA Fall (on) (from) other stairs and steps, initial encounter: Secondary | ICD-10-CM

## 2020-04-26 NOTE — ED Provider Notes (Signed)
[facility]   [id number] 04/26/20 Arrival Time: 1144   Chief Complaint  Patient presents with  . Ankle Pain     SUBJECTIVE: History from: patient.  Molly Knight is a 65 y.o. female presented to the urgent care for complaint of left ankle pain for the past 2 to 3 days.  Developed the symptom after falling down 3 steps.  Localized the pain to the left ankle.  She describes the pain as constant and achy.  She has tried OTC NSAIDs with relief.  Her symptoms are made worse with ROM.  She denies similar symptoms in the past.     ROS: As per HPI.  All other pertinent ROS negative.      History reviewed. No pertinent past medical history. Past Surgical History:  Procedure Laterality Date  . ABDOMINAL HYSTERECTOMY  2003  . SHOULDER SURGERY  02/2019  . THYROIDECTOMY, PARTIAL  10/2016   No Known Allergies No current facility-administered medications on file prior to encounter.   Current Outpatient Medications on File Prior to Encounter  Medication Sig Dispense Refill  . estradiol (VIVELLE-DOT) 0.05 MG/24HR patch 1 patch 2 (two) times a week.    Marland Kitchen FLUoxetine (PROZAC) 20 MG capsule Take 1 capsule (20 mg total) by mouth daily. 30 capsule 3  . levothyroxine (SYNTHROID) 75 MCG tablet TAKE 1 TABLET(75 MCG) BY MOUTH DAILY BEFORE BREAKFAST 90 tablet 0  . metroNIDAZOLE (METROGEL) 1 % gel SMARTSIG:1 Topical Every Night    . Multiple Vitamins-Minerals (MULTI VITAMIN/MINERALS) TABS Take 1 tablet by mouth daily.    Marland Kitchen omeprazole (PRILOSEC) 20 MG capsule Take 20 mg by mouth daily.     Social History   Socioeconomic History  . Marital status: Married    Spouse name: Not on file  . Number of children: Not on file  . Years of education: Not on file  . Highest education level: Not on file  Occupational History  . Not on file  Tobacco Use  . Smoking status: Never Smoker  . Smokeless tobacco: Never Used  Substance and Sexual Activity  . Alcohol use: Never  . Drug use: Never  .  Sexual activity: Not on file  Other Topics Concern  . Not on file  Social History Narrative  . Not on file   Social Determinants of Health   Financial Resource Strain: Low Risk   . Difficulty of Paying Living Expenses: Not hard at all  Food Insecurity: No Food Insecurity  . Worried About Programme researcher, broadcasting/film/video in the Last Year: Never true  . Ran Out of Food in the Last Year: Never true  Transportation Needs: No Transportation Needs  . Lack of Transportation (Medical): No  . Lack of Transportation (Non-Medical): No  Physical Activity: Insufficiently Active  . Days of Exercise per Week: 3 days  . Minutes of Exercise per Session: 30 min  Stress: No Stress Concern Present  . Feeling of Stress : Not at all  Social Connections: Moderately Isolated  . Frequency of Communication with Friends and Family: More than three times a week  . Frequency of Social Gatherings with Friends and Family: More than three times a week  . Attends Religious Services: Never  . Active Member of Clubs or Organizations: No  . Attends Banker Meetings: Never  . Marital Status: Married  Catering manager Violence: Not At Risk  . Fear of Current or Ex-Partner: No  . Emotionally Abused: No  . Physically Abused: No  . Sexually Abused:  No   Family History  Problem Relation Age of Onset  . Aortic aneurysm Mother   . Colon cancer Sister   . Throat cancer Brother     OBJECTIVE:  Vitals:   04/26/20 1224 04/26/20 1226  BP:  132/79  Pulse:  60  Resp:  19  Temp:  98.2 F (36.8 C)  TempSrc:  Oral  SpO2:  97%  Weight: 190 lb (86.2 kg)   Height: 5\' 7"  (1.702 m)      Physical Exam Vitals and nursing note reviewed.  Constitutional:      General: She is not in acute distress.    Appearance: Normal appearance. She is normal weight. She is not ill-appearing, toxic-appearing or diaphoretic.  HENT:     Head: Normocephalic.  Cardiovascular:     Rate and Rhythm: Normal rate and regular rhythm.      Pulses: Normal pulses.     Heart sounds: Normal heart sounds. No murmur heard. No friction rub. No gallop.   Pulmonary:     Effort: Pulmonary effort is normal. No respiratory distress.     Breath sounds: Normal breath sounds. No stridor. No wheezing, rhonchi or rales.  Chest:     Chest wall: No tenderness.  Musculoskeletal:        General: Tenderness present.     Left ankle: Swelling present. Tenderness present.     Comments: The left ankle is with obvious deformity when compared to the right ankle.  Swelling present.  There is no warmth, lesion, ecchymosis present.  Normal range of motion with mild pain.  Neurovascular status intact.  Neurological:     Mental Status: She is alert and oriented to person, place, and time.     LABS:  No results found for this or any previous visit (from the past 24 hour(s)).   RADIOLOGY:  DG Ankle Complete Left  Result Date: 04/26/2020 CLINICAL DATA:  06/24/2020 3 days ago on steps, injury to LEFT ankle, pain, swelling and bruising laterally and medially, able to bear weight EXAM: LEFT ANKLE COMPLETE - 3+ VIEW COMPARISON:  None FINDINGS: Diffuse soft tissue swelling. Osseous mineralization borderline decreased. Joint spaces preserved. No acute fracture, dislocation, or bone destruction. IMPRESSION: Soft tissue swelling without acute osseous abnormalities. Electronically Signed   By: Larey Seat M.D.   On: 04/26/2020 12:46    The left ankle x-ray is negative for bony abnormality including fracture or dislocation.  I have reviewed the x-ray myself and the radiologist interpretation.  I am in agreement with the radiologist interpretation.   ASSESSMENT & PLAN:  1. Fall down steps, initial encounter   2. Acute left ankle pain     No orders of the defined types were placed in this encounter.  Discharge instructions  Continue to take OTC NSAID as needed for pain Follow RICE instruction that is attached Follow-up with PCP Return or go to ED if you develop  any new or worsening of your symptoms   Reviewed expectations re: course of current medical issues. Questions answered. Outlined signs and symptoms indicating need for more acute intervention. Patient verbalized understanding. After Visit Summary given.         06/24/2020, FNP 04/26/20 1316

## 2020-04-26 NOTE — Discharge Instructions (Addendum)
Continue to take OTC NSAID as needed for pain Follow RICE instruction that is attached Follow-up with PCP Return or go to ED if you develop any new or worsening of your symptoms

## 2020-04-26 NOTE — ED Triage Notes (Signed)
LT ankle pain since Monday afternoon, pt slipped and fell down 3 steps.

## 2020-05-01 DIAGNOSIS — E890 Postprocedural hypothyroidism: Secondary | ICD-10-CM | POA: Diagnosis not present

## 2020-05-01 DIAGNOSIS — E559 Vitamin D deficiency, unspecified: Secondary | ICD-10-CM | POA: Diagnosis not present

## 2020-05-01 DIAGNOSIS — R232 Flushing: Secondary | ICD-10-CM | POA: Diagnosis not present

## 2020-05-01 DIAGNOSIS — Z7689 Persons encountering health services in other specified circumstances: Secondary | ICD-10-CM | POA: Diagnosis not present

## 2020-05-01 DIAGNOSIS — E041 Nontoxic single thyroid nodule: Secondary | ICD-10-CM | POA: Diagnosis not present

## 2020-05-06 LAB — CMP14+EGFR
ALT: 16 IU/L (ref 0–32)
AST: 16 IU/L (ref 0–40)
Albumin/Globulin Ratio: 1.8 (ref 1.2–2.2)
Albumin: 4.2 g/dL (ref 3.8–4.8)
Alkaline Phosphatase: 84 IU/L (ref 44–121)
BUN/Creatinine Ratio: 15 (ref 12–28)
BUN: 12 mg/dL (ref 8–27)
Bilirubin Total: 0.3 mg/dL (ref 0.0–1.2)
CO2: 26 mmol/L (ref 20–29)
Calcium: 9.1 mg/dL (ref 8.7–10.3)
Chloride: 102 mmol/L (ref 96–106)
Creatinine, Ser: 0.79 mg/dL (ref 0.57–1.00)
GFR calc Af Amer: 92 mL/min/{1.73_m2} (ref >59)
GFR calc non Af Amer: 80 mL/min/{1.73_m2} (ref >59)
Globulin, Total: 2.3 g/dL (ref 1.5–4.5)
Glucose: 102 mg/dL — ABNORMAL HIGH (ref 65–99)
Potassium: 4.8 mmol/L (ref 3.5–5.2)
Sodium: 138 mmol/L (ref 134–144)
Total Protein: 6.5 g/dL (ref 6.0–8.5)

## 2020-05-06 LAB — CBC
Hematocrit: 40.4 % (ref 34.0–46.6)
Hemoglobin: 13.4 g/dL (ref 11.1–15.9)
MCH: 30.4 pg (ref 26.6–33.0)
MCHC: 33.2 g/dL (ref 31.5–35.7)
MCV: 92 fL (ref 79–97)
Platelets: 309 10*3/uL (ref 150–450)
RBC: 4.41 x10E6/uL (ref 3.77–5.28)
RDW: 11.4 % — ABNORMAL LOW (ref 11.7–15.4)
WBC: 5.7 10*3/uL (ref 3.4–10.8)

## 2020-05-06 LAB — LIPID PANEL
Chol/HDL Ratio: 3.1 ratio (ref 0.0–4.4)
Cholesterol, Total: 238 mg/dL — ABNORMAL HIGH (ref 100–199)
HDL: 78 mg/dL (ref >39)
LDL Chol Calc (NIH): 149 mg/dL — ABNORMAL HIGH (ref 0–99)
Triglycerides: 67 mg/dL (ref 0–149)
VLDL Cholesterol Cal: 11 mg/dL (ref 5–40)

## 2020-05-06 LAB — VITAMIN D 1,25 DIHYDROXY
Vitamin D 1, 25 (OH)2 Total: 45 pg/mL
Vitamin D2 1, 25 (OH)2: <10 pg/mL
Vitamin D3 1, 25 (OH)2: 45 pg/mL

## 2020-05-06 LAB — HEMOGLOBIN A1C
Est. average glucose Bld gHb Est-mCnc: 108 mg/dL
Hgb A1c MFr Bld: 5.4 % (ref 4.8–5.6)

## 2020-05-06 LAB — T4 AND TSH
T4, Total: 7.2 ug/dL (ref 4.5–12.0)
TSH: 2.03 u[IU]/mL (ref 0.450–4.500)

## 2020-05-11 ENCOUNTER — Other Ambulatory Visit: Payer: Self-pay

## 2020-05-11 ENCOUNTER — Encounter: Payer: Self-pay | Admitting: Internal Medicine

## 2020-05-11 ENCOUNTER — Ambulatory Visit: Payer: Federal, State, Local not specified - PPO | Admitting: Internal Medicine

## 2020-05-11 VITALS — BP 145/81 | HR 67 | Temp 98.2°F | Ht 67.0 in | Wt 196.0 lb

## 2020-05-11 DIAGNOSIS — R232 Flushing: Secondary | ICD-10-CM | POA: Diagnosis not present

## 2020-05-11 DIAGNOSIS — S93402D Sprain of unspecified ligament of left ankle, subsequent encounter: Secondary | ICD-10-CM | POA: Diagnosis not present

## 2020-05-11 DIAGNOSIS — F32A Depression, unspecified: Secondary | ICD-10-CM

## 2020-05-11 DIAGNOSIS — E890 Postprocedural hypothyroidism: Secondary | ICD-10-CM | POA: Diagnosis not present

## 2020-05-11 DIAGNOSIS — R030 Elevated blood-pressure reading, without diagnosis of hypertension: Secondary | ICD-10-CM | POA: Insufficient documentation

## 2020-05-11 NOTE — Assessment & Plan Note (Signed)
Due to h/o multinodular goiter Takes Levothyroxine 75 mcg QD Reviewed labs, TSH wnl

## 2020-05-11 NOTE — Assessment & Plan Note (Signed)
Well-controlled with Fluoxetine

## 2020-05-11 NOTE — Patient Instructions (Addendum)
Please take Tylenol for pain/inflammation of left ankle.  Please cut down coffee to 1-2 drinks/day.  Please follow DASH diet and perform moderate exercise/walking at least 150 mins/week.  DASH stands for Dietary Approaches to Stop Hypertension. The DASH diet is a healthy-eating plan designed to help treat or prevent high blood pressure (hypertension).  The DASH diet includes foods that are rich in potassium, calcium and magnesium. These nutrients help control blood pressure. The diet limits foods that are high in sodium, saturated fat and added sugars.  Studies have shown that the DASH diet can lower blood pressure in as little as two weeks. The diet can also lower low-density lipoprotein (LDL or "bad") cholesterol levels in the blood. High blood pressure and high LDL cholesterol levels are two major risk factors for heart disease and stroke.    DASH diet: Recommended servings The DASH diet provides daily and weekly nutritional goals. The number of servings you should have depends on your daily calorie needs.  Here's a look at the recommended servings from each food group for a 2,000-calorie-a-day DASH diet:  Grains: 6 to 8 servings a day. One serving is one slice bread, 1 ounce dry cereal, or 1/2 cup cooked cereal, rice or pasta. Vegetables: 4 to 5 servings a day. One serving is 1 cup raw leafy green vegetable, 1/2 cup cut-up raw or cooked vegetables, or 1/2 cup vegetable juice. Fruits: 4 to 5 servings a day. One serving is one medium fruit, 1/2 cup fresh, frozen or canned fruit, or 1/2 cup fruit juice. Fat-free or low-fat dairy products: 2 to 3 servings a day. One serving is 1 cup milk or yogurt, or 1 1/2 ounces cheese. Lean meats, poultry and fish: six 1-ounce servings or fewer a day. One serving is 1 ounce cooked meat, poultry or fish, or 1 egg. Nuts, seeds and legumes: 4 to 5 servings a week. One serving is 1/3 cup nuts, 2 tablespoons peanut butter, 2 tablespoons seeds, or 1/2 cup cooked  legumes (dried beans or peas). Fats and oils: 2 to 3 servings a day. One serving is 1 teaspoon soft margarine, 1 teaspoon vegetable oil, 1 tablespoon mayonnaise or 2 tablespoons salad dressing. Sweets and added sugars: 5 servings or fewer a week. One serving is 1 tablespoon sugar, jelly or jam, 1/2 cup sorbet, or 1 cup lemonade.

## 2020-05-11 NOTE — Assessment & Plan Note (Signed)
BP Readings from Last 1 Encounters:  05/11/20 (!) 145/81   New-onset, if persistently elevated, plan to start ACEi/ARB Has high caffeine intake, about 5 coffee in a day - advised to cut down Advised DASH diet and moderate exercise/walking, at least 150 mins/week

## 2020-05-11 NOTE — Assessment & Plan Note (Signed)
After a fall from steps X-ray of ankle negative for any acute fracture/dislocation Pain/inflammation better now Rest, ice and Tylenol advised

## 2020-05-11 NOTE — Assessment & Plan Note (Signed)
Uses estradiol patch for long time

## 2020-05-11 NOTE — Progress Notes (Signed)
Established Patient Office Visit  Subjective:  Patient ID: Molly Knight, female    DOB: Apr 13, 1956  Age: 65 y.o. MRN: [id number]  CC:  Chief Complaint  Patient presents with  . Follow-up    HPI Molly Knight is a 65 year old female with PMH of postoperative hypothyroidism, hot flashes and depression who presents for follow up of her chronic medical conditions and blood tests.  Her blood pressure was 145/81 in the office today. She states that she takes about 5 cups of coffee in a day and just had 1 cup of coffee this morning. She denies any chest pain, dyspnea or palpitations. She is willing to try DASH diet before starting medication.  She had a recent ER visit after a fall from 2-3 steps due to snow. She had left ankle pain, and states It turned purple at that time. Swelling and pain has been better now. She took Aspirin to help with the pain.  Her blood tests were reviewed and discussed in detail with her.  History reviewed. No pertinent past medical history.  Past Surgical History:  Procedure Laterality Date  . ABDOMINAL HYSTERECTOMY  2003  . SHOULDER SURGERY  02/2019  . THYROIDECTOMY, PARTIAL  10/2016    Family History  Problem Relation Age of Onset  . Aortic aneurysm Mother   . Colon cancer Sister   . Throat cancer Brother     Social History   Socioeconomic History  . Marital status: Married    Spouse name: Not on file  . Number of children: Not on file  . Years of education: Not on file  . Highest education level: Not on file  Occupational History  . Not on file  Tobacco Use  . Smoking status: Never Smoker  . Smokeless tobacco: Never Used  Substance and Sexual Activity  . Alcohol use: Never  . Drug use: Never  . Sexual activity: Not on file  Other Topics Concern  . Not on file  Social History Narrative  . Not on file   Social Determinants of Health   Financial Resource Strain: Low Risk   . Difficulty of Paying Living Expenses: Not hard  at all  Food Insecurity: No Food Insecurity  . Worried About Programme researcher, broadcasting/film/video in the Last Year: Never true  . Ran Out of Food in the Last Year: Never true  Transportation Needs: No Transportation Needs  . Lack of Transportation (Medical): No  . Lack of Transportation (Non-Medical): No  Physical Activity: Insufficiently Active  . Days of Exercise per Week: 3 days  . Minutes of Exercise per Session: 30 min  Stress: No Stress Concern Present  . Feeling of Stress : Not at all  Social Connections: Moderately Isolated  . Frequency of Communication with Friends and Family: More than three times a week  . Frequency of Social Gatherings with Friends and Family: More than three times a week  . Attends Religious Services: Never  . Active Member of Clubs or Organizations: No  . Attends Banker Meetings: Never  . Marital Status: Married  Catering manager Violence: Not At Risk  . Fear of Current or Ex-Partner: No  . Emotionally Abused: No  . Physically Abused: No  . Sexually Abused: No    Outpatient Medications Prior to Visit  Medication Sig Dispense Refill  . estradiol (VIVELLE-DOT) 0.05 MG/24HR patch 1 patch 2 (two) times a week.    . levothyroxine (SYNTHROID) 75 MCG tablet TAKE 1 TABLET(75 MCG) BY MOUTH DAILY BEFORE  BREAKFAST 90 tablet 0  . metroNIDAZOLE (METROGEL) 1 % gel SMARTSIG:1 Topical Every Night    . Multiple Vitamins-Minerals (MULTI VITAMIN/MINERALS) TABS Take 1 tablet by mouth daily.    Marland Kitchen FLUoxetine (PROZAC) 20 MG capsule Take 1 capsule (20 mg total) by mouth daily. 30 capsule 3  . omeprazole (PRILOSEC) 20 MG capsule Take 20 mg by mouth daily.     No facility-administered medications prior to visit.    No Known Allergies  ROS Review of Systems  Constitutional: Negative for chills and fever.  HENT: Negative for congestion, sinus pressure, sinus pain and sore throat.   Eyes: Negative for pain and discharge.  Respiratory: Negative for cough and shortness of  breath.   Cardiovascular: Negative for chest pain and palpitations.  Gastrointestinal: Negative for abdominal pain, constipation, diarrhea, nausea and vomiting.  Endocrine: Negative for polydipsia and polyuria.  Genitourinary: Negative for dysuria and hematuria.  Musculoskeletal: Negative for neck pain and neck stiffness.       Left ankle pain  Skin: Negative for rash.  Neurological: Negative for dizziness and weakness.  Psychiatric/Behavioral: Negative for agitation and behavioral problems.      Objective:    Physical Exam Vitals reviewed.  Constitutional:      General: She is not in acute distress.    Appearance: She is not diaphoretic.  HENT:     Head: Normocephalic and atraumatic.     Nose: Nose normal. No congestion.     Mouth/Throat:     Mouth: Mucous membranes are moist.     Pharynx: No posterior oropharyngeal erythema.  Eyes:     General: No scleral icterus.    Extraocular Movements: Extraocular movements intact.     Pupils: Pupils are equal, round, and reactive to light.  Cardiovascular:     Rate and Rhythm: Normal rate and regular rhythm.     Pulses: Normal pulses.     Heart sounds: Normal heart sounds. No murmur heard.   Pulmonary:     Breath sounds: Normal breath sounds. No wheezing or rales.  Musculoskeletal:     Cervical back: Neck supple. No tenderness.     Right lower leg: No edema.     Left lower leg: No edema.  Skin:    General: Skin is warm.     Findings: No rash.  Neurological:     General: No focal deficit present.     Mental Status: She is alert and oriented to person, place, and time.  Psychiatric:        Mood and Affect: Mood normal.        Behavior: Behavior normal.     BP (!) 145/81 (BP Location: Right Arm, Patient Position: Sitting, Cuff Size: Normal)   Pulse 67   Temp 98.2 F (36.8 C) (Temporal)   Ht 5\' 7"  (1.702 m)   Wt 196 lb (88.9 kg)   LMP  (LMP Unknown)   SpO2 98%   BMI 30.70 kg/m  Wt Readings from Last 3 Encounters:   05/11/20 196 lb (88.9 kg)  04/26/20 190 lb (86.2 kg)  01/10/20 192 lb 6.4 oz (87.3 kg)     Health Maintenance Due  Topic Date Due  . Hepatitis C Screening  Never done  . HIV Screening  Never done  . TETANUS/TDAP  Never done    There are no preventive care reminders to display for this patient.  Lab Results  Component Value Date   TSH 2.030 05/01/2020   Lab Results  Component Value Date  WBC 5.7 05/01/2020   HGB 13.4 05/01/2020   HCT 40.4 05/01/2020   MCV 92 05/01/2020   PLT 309 05/01/2020   Lab Results  Component Value Date   NA 138 05/01/2020   K 4.8 05/01/2020   CO2 26 05/01/2020   GLUCOSE 102 (H) 05/01/2020   BUN 12 05/01/2020   CREATININE 0.79 05/01/2020   BILITOT 0.3 05/01/2020   ALKPHOS 84 05/01/2020   AST 16 05/01/2020   ALT 16 05/01/2020   PROT 6.5 05/01/2020   ALBUMIN 4.2 05/01/2020   CALCIUM 9.1 05/01/2020   Lab Results  Component Value Date   CHOL 238 (H) 05/01/2020   Lab Results  Component Value Date   HDL 78 05/01/2020   Lab Results  Component Value Date   LDLCALC 149 (H) 05/01/2020   Lab Results  Component Value Date   TRIG 67 05/01/2020   Lab Results  Component Value Date   CHOLHDL 3.1 05/01/2020   Lab Results  Component Value Date   HGBA1C 5.4 05/01/2020      Assessment & Plan:   Problem List Items Addressed This Visit      Elevated BP without diagnosis of hypertension   BP Readings from Last 1 Encounters:  05/11/20 (!) 145/81   New-onset, if persistently elevated, plan to start ACEi/ARB Has high caffeine intake, about 5 coffee in a day - advised to cut down Advised DASH diet and moderate exercise/walking, at least 150 mins/week            Endocrine   Postoperative hypothyroidism - Primary    Due to h/o multinodular goiter Takes Levothyroxine 75 mcg QD Reviewed labs, TSH wnl        Musculoskeletal and Integument   Sprain of left ankle    After a fall from steps X-ray of ankle negative for any acute  fracture/dislocation Pain/inflammation better now Rest, ice and Tylenol advised        Other   Depression    Well-controlled with Fluoxetine      Cardiovascular and Mediastinum  Hot flashes   Uses estradiol patch for long time        No orders of the defined types were placed in this encounter.   Follow-up: Return in about 6 weeks (around 06/22/2020) for BP check.    Anabel Halon, MD

## 2020-06-07 ENCOUNTER — Other Ambulatory Visit: Payer: Self-pay | Admitting: Internal Medicine

## 2020-06-07 DIAGNOSIS — F320 Major depressive disorder, single episode, mild: Secondary | ICD-10-CM

## 2020-06-07 DIAGNOSIS — E890 Postprocedural hypothyroidism: Secondary | ICD-10-CM

## 2020-06-22 ENCOUNTER — Ambulatory Visit: Payer: Federal, State, Local not specified - PPO | Admitting: Internal Medicine

## 2020-06-25 ENCOUNTER — Other Ambulatory Visit: Payer: Self-pay

## 2020-06-25 ENCOUNTER — Encounter: Payer: Self-pay | Admitting: Internal Medicine

## 2020-06-25 ENCOUNTER — Ambulatory Visit: Payer: Federal, State, Local not specified - PPO | Admitting: Internal Medicine

## 2020-06-25 VITALS — BP 136/82 | HR 69 | Temp 98.1°F | Resp 16 | Ht 67.0 in | Wt 199.4 lb

## 2020-06-25 DIAGNOSIS — R232 Flushing: Secondary | ICD-10-CM

## 2020-06-25 DIAGNOSIS — E890 Postprocedural hypothyroidism: Secondary | ICD-10-CM | POA: Diagnosis not present

## 2020-06-25 DIAGNOSIS — R030 Elevated blood-pressure reading, without diagnosis of hypertension: Secondary | ICD-10-CM | POA: Diagnosis not present

## 2020-06-25 DIAGNOSIS — R0683 Snoring: Secondary | ICD-10-CM | POA: Insufficient documentation

## 2020-06-25 DIAGNOSIS — F32A Depression, unspecified: Secondary | ICD-10-CM

## 2020-06-25 NOTE — Assessment & Plan Note (Signed)
Reports daytime fatigue, sleepiness and fatigue Will try to obtain home sleep study Elevated BP could also be a sign of untreated OSA

## 2020-06-25 NOTE — Patient Instructions (Signed)
Please continue to follow DASH diet and perform moderate exercise/walking at least 150 mins/week.  We are trying to schedule home sleep study for you. Please contact us if you don't get an update after 2 weeks.

## 2020-06-25 NOTE — Assessment & Plan Note (Signed)
Due to h/o multinodular goiter Takes Levothyroxine 75 mcg QD

## 2020-06-25 NOTE — Assessment & Plan Note (Signed)
On Prozac 20 mg QD Has resistant symptoms like fatigue, which could be confounding with OSA symptoms, will check sleep study before adjusting depression medications

## 2020-06-25 NOTE — Assessment & Plan Note (Signed)
BP Readings from Last 1 Encounters:  06/25/20 136/82   Well-controlled with diet modification Advised DASH diet and moderate exercise/walking, at least 150 mins/week

## 2020-06-25 NOTE — Assessment & Plan Note (Signed)
On Estradiol patch C/o hot flashes, especially at nighttime If persistent symptoms, will change to oral HRT

## 2020-06-25 NOTE — Progress Notes (Signed)
Established Patient Office Visit  Subjective:  Patient ID: Molly Knight, female    DOB: Sep 17, 1955  Age: 65 y.o. MRN: [id number]  CC:  Chief Complaint  Patient presents with  . Follow-up    6 week follow up bp check    HPI Molly Knight  a10 year old female with PMH of postoperative hypothyroidism, hot flashes and depression who presents for follow up of HTN and with c/o hot flashes.  She has been placing Estradiol patch for hot flashes and still has hot flashes, especially at nighttime. Of note, she also mentions daytime fatigue and sleepiness along with snoring at nighttime. Has not had sleep study before.  Her BP was 136/82 today, better compared to prior. She has been cutting down caffeine intake. She has been trying to follow low salt diet. She denies any headache, chest pain, dyspnea or palpitations.  She also c/o resistant depressed mood even with Prozac. She attributes it to tiredness during the day. She denies any SI or HI.  History reviewed. No pertinent past medical history.  Past Surgical History:  Procedure Laterality Date  . ABDOMINAL HYSTERECTOMY  2003  . SHOULDER SURGERY  02/2019  . THYROIDECTOMY, PARTIAL  10/2016    Family History  Problem Relation Age of Onset  . Aortic aneurysm Mother   . Colon cancer Sister   . Throat cancer Brother     Social History   Socioeconomic History  . Marital status: Married    Spouse name: Not on file  . Number of children: Not on file  . Years of education: Not on file  . Highest education level: Not on file  Occupational History  . Not on file  Tobacco Use  . Smoking status: Never Smoker  . Smokeless tobacco: Never Used  Substance and Sexual Activity  . Alcohol use: Never  . Drug use: Never  . Sexual activity: Not on file  Other Topics Concern  . Not on file  Social History Narrative  . Not on file   Social Determinants of Health   Financial Resource Strain: Low Risk   . Difficulty of Paying  Living Expenses: Not hard at all  Food Insecurity: No Food Insecurity  . Worried About Programme researcher, broadcasting/film/video in the Last Year: Never true  . Ran Out of Food in the Last Year: Never true  Transportation Needs: No Transportation Needs  . Lack of Transportation (Medical): No  . Lack of Transportation (Non-Medical): No  Physical Activity: Insufficiently Active  . Days of Exercise per Week: 3 days  . Minutes of Exercise per Session: 30 min  Stress: No Stress Concern Present  . Feeling of Stress : Not at all  Social Connections: Moderately Isolated  . Frequency of Communication with Friends and Family: More than three times a week  . Frequency of Social Gatherings with Friends and Family: More than three times a week  . Attends Religious Services: Never  . Active Member of Clubs or Organizations: No  . Attends Banker Meetings: Never  . Marital Status: Married  Catering manager Violence: Not At Risk  . Fear of Current or Ex-Partner: No  . Emotionally Abused: No  . Physically Abused: No  . Sexually Abused: No    Outpatient Medications Prior to Visit  Medication Sig Dispense Refill  . estradiol (VIVELLE-DOT) 0.05 MG/24HR patch 1 patch 2 (two) times a week.    Marland Kitchen FLUoxetine (PROZAC) 20 MG capsule TAKE 1 CAPSULE(20 MG) BY MOUTH DAILY 30 capsule 3  .  levothyroxine (SYNTHROID) 75 MCG tablet TAKE 1 TABLET(75 MCG) BY MOUTH DAILY BEFORE AND BREAKFAST 90 tablet 0  . metroNIDAZOLE (METROGEL) 1 % gel SMARTSIG:1 Topical Every Night    . Multiple Vitamins-Minerals (MULTI VITAMIN/MINERALS) TABS Take 1 tablet by mouth daily.    Marland Kitchen omeprazole (PRILOSEC OTC) 20 MG tablet      No facility-administered medications prior to visit.    No Known Allergies  ROS Review of Systems  Constitutional: Negative for chills and fever.  HENT: Negative for congestion, sinus pressure, sinus pain and sore throat.   Eyes: Negative for pain and discharge.  Respiratory: Negative for cough and shortness of  breath.   Cardiovascular: Negative for chest pain and palpitations.  Gastrointestinal: Negative for abdominal pain, constipation, diarrhea, nausea and vomiting.  Endocrine: Negative for polydipsia and polyuria.  Genitourinary: Negative for dysuria and hematuria.  Musculoskeletal: Negative for neck pain and neck stiffness.       Left ankle pain  Skin: Negative for rash.  Neurological: Negative for dizziness and weakness.  Psychiatric/Behavioral: Positive for dysphoric mood. Negative for agitation and behavioral problems. The patient is nervous/anxious.       Objective:    Physical Exam Vitals reviewed.  Constitutional:      General: She is not in acute distress.    Appearance: She is not diaphoretic.  HENT:     Head: Normocephalic and atraumatic.     Nose: Nose normal. No congestion.     Mouth/Throat:     Mouth: Mucous membranes are moist.     Pharynx: No posterior oropharyngeal erythema.  Eyes:     General: No scleral icterus.    Extraocular Movements: Extraocular movements intact.     Pupils: Pupils are equal, round, and reactive to light.  Cardiovascular:     Rate and Rhythm: Normal rate and regular rhythm.     Pulses: Normal pulses.     Heart sounds: Normal heart sounds. No murmur heard.   Pulmonary:     Breath sounds: Normal breath sounds. No wheezing or rales.  Musculoskeletal:     Cervical back: Neck supple. No tenderness.     Right lower leg: No edema.     Left lower leg: No edema.  Skin:    General: Skin is warm.     Findings: No rash.  Neurological:     General: No focal deficit present.     Mental Status: She is alert and oriented to person, place, and time.  Psychiatric:        Mood and Affect: Mood normal.        Behavior: Behavior normal.     BP 136/82 (BP Location: Left Arm, Patient Position: Sitting)   Pulse 69   Temp 98.1 F (36.7 C) (Oral)   Resp 16   Ht 5\' 7"  (1.702 m)   Wt 199 lb 6.4 oz (90.4 kg)   LMP  (LMP Unknown)   SpO2 98%   BMI  31.23 kg/m  Wt Readings from Last 3 Encounters:  06/25/20 199 lb 6.4 oz (90.4 kg)  05/11/20 196 lb (88.9 kg)  04/26/20 190 lb (86.2 kg)     Health Maintenance Due  Topic Date Due  . Hepatitis C Screening  Never done  . HIV Screening  Never done  . TETANUS/TDAP  Never done  . MAMMOGRAM  06/20/2020    There are no preventive care reminders to display for this patient.  Lab Results  Component Value Date   TSH 2.030 05/01/2020  Lab Results  Component Value Date   WBC 5.7 05/01/2020   HGB 13.4 05/01/2020   HCT 40.4 05/01/2020   MCV 92 05/01/2020   PLT 309 05/01/2020   Lab Results  Component Value Date   NA 138 05/01/2020   K 4.8 05/01/2020   CO2 26 05/01/2020   GLUCOSE 102 (H) 05/01/2020   BUN 12 05/01/2020   CREATININE 0.79 05/01/2020   BILITOT 0.3 05/01/2020   ALKPHOS 84 05/01/2020   AST 16 05/01/2020   ALT 16 05/01/2020   PROT 6.5 05/01/2020   ALBUMIN 4.2 05/01/2020   CALCIUM 9.1 05/01/2020   Lab Results  Component Value Date   CHOL 238 (H) 05/01/2020   Lab Results  Component Value Date   HDL 78 05/01/2020   Lab Results  Component Value Date   LDLCALC 149 (H) 05/01/2020   Lab Results  Component Value Date   TRIG 67 05/01/2020   Lab Results  Component Value Date   CHOLHDL 3.1 05/01/2020   Lab Results  Component Value Date   HGBA1C 5.4 05/01/2020      Assessment & Plan:   Problem List Items Addressed This Visit      Cardiovascular and Mediastinum   Hot flashes    On Estradiol patch C/o hot flashes, especially at nighttime If persistent symptoms, will change to oral HRT        Endocrine   Postoperative hypothyroidism    Due to h/o multinodular goiter Takes Levothyroxine 75 mcg QD        Other   Depression    On Prozac 20 mg QD Has resistant symptoms like fatigue, which could be confounding with OSA symptoms, will check sleep study before adjusting depression medications      Prehypertension - Primary    BP Readings from  Last 1 Encounters:  06/25/20 136/82   Well-controlled with diet modification Advised DASH diet and moderate exercise/walking, at least 150 mins/week       Snoring    Reports daytime fatigue, sleepiness and fatigue Will try to obtain home sleep study Elevated BP could also be a sign of untreated OSA      Relevant Orders   Home sleep test      No orders of the defined types were placed in this encounter.   Follow-up: Return in about 4 months (around 10/25/2020) for BP and fatigue.    Anabel Halon, MD

## 2020-07-10 ENCOUNTER — Telehealth: Payer: Self-pay

## 2020-07-10 NOTE — Telephone Encounter (Signed)
Please make pt first available appt to discuss with provider

## 2020-07-10 NOTE — Telephone Encounter (Signed)
Patient would like to come off the patches and go to the pills in regards to her estradiol uses [facility] ph# [phone]

## 2020-07-10 NOTE — Telephone Encounter (Signed)
Patient has also not heard back anything about a sleep study

## 2020-07-10 NOTE — Telephone Encounter (Signed)
Lvm to schedule appt.

## 2020-07-26 DIAGNOSIS — L718 Other rosacea: Secondary | ICD-10-CM | POA: Diagnosis not present

## 2020-07-26 DIAGNOSIS — D225 Melanocytic nevi of trunk: Secondary | ICD-10-CM | POA: Diagnosis not present

## 2020-07-26 DIAGNOSIS — Z1283 Encounter for screening for malignant neoplasm of skin: Secondary | ICD-10-CM | POA: Diagnosis not present

## 2020-07-28 ENCOUNTER — Encounter: Payer: Self-pay | Admitting: Internal Medicine

## 2020-08-27 ENCOUNTER — Ambulatory Visit (INDEPENDENT_AMBULATORY_CARE_PROVIDER_SITE_OTHER): Payer: Federal, State, Local not specified - PPO | Admitting: Obstetrics & Gynecology

## 2020-08-27 ENCOUNTER — Encounter: Payer: Self-pay | Admitting: Obstetrics & Gynecology

## 2020-08-27 ENCOUNTER — Other Ambulatory Visit: Payer: Self-pay

## 2020-08-27 VITALS — BP 147/86 | HR 66 | Ht 67.0 in | Wt 198.0 lb

## 2020-08-27 DIAGNOSIS — Z1231 Encounter for screening mammogram for malignant neoplasm of breast: Secondary | ICD-10-CM

## 2020-08-27 DIAGNOSIS — Z1212 Encounter for screening for malignant neoplasm of rectum: Secondary | ICD-10-CM

## 2020-08-27 DIAGNOSIS — Z01419 Encounter for gynecological examination (general) (routine) without abnormal findings: Secondary | ICD-10-CM | POA: Diagnosis not present

## 2020-08-27 DIAGNOSIS — Z1211 Encounter for screening for malignant neoplasm of colon: Secondary | ICD-10-CM

## 2020-08-27 LAB — HEMOCCULT GUIAC POC 1CARD (OFFICE): Fecal Occult Blood, POC: NEGATIVE

## 2020-08-27 MED ORDER — ESTRADIOL 0.05 MG/24HR TD PTTW: 1.0000 | MEDICATED_PATCH | TRANSDERMAL | 3 refills | Status: DC

## 2020-08-27 NOTE — Progress Notes (Signed)
Subjective:     Molly Knight is a 65 y.o. female here for a routine exam.  No LMP recorded (lmp unknown). Patient has had a hysterectomy. [id number] Birth Control Method:  Post menopausal/hysterectomy Menstrual Calendar(currently): n/a  Current complaints: none.   Current acute medical issues:  none   Recent Gynecologic History No LMP recorded (lmp unknown). Patient has had a hysterectomy. Last Pap: n/a,   Last mammogram: 2020, ordered today,  normal  Past Medical History:  Diagnosis Date  . Depression   . Thyroid disease     Past Surgical History:  Procedure Laterality Date  . ABDOMINAL HYSTERECTOMY  2003  . bladder tach    . CESAREAN SECTION    . SHOULDER SURGERY Bilateral 02/2019  . THYROIDECTOMY, PARTIAL  10/2016    OB History    Gravida  6   Para  3   Term  3   Preterm      AB  3   Living  3     SAB  3   IAB      Ectopic      Multiple      Live Births  3           Social History   Socioeconomic History  . Marital status: Married    Spouse name: Not on file  . Number of children: Not on file  . Years of education: Not on file  . Highest education level: Not on file  Occupational History  . Not on file  Tobacco Use  . Smoking status: Never Smoker  . Smokeless tobacco: Never Used  Vaping Use  . Vaping Use: Never used  Substance and Sexual Activity  . Alcohol use: Not Currently  . Drug use: Never  . Sexual activity: Not Currently    Birth control/protection: Surgical    Comment: hyst  Other Topics Concern  . Not on file  Social History Narrative  . Not on file   Social Determinants of Health   Financial Resource Strain: Low Risk   . Difficulty of Paying Living Expenses: Not hard at all  Food Insecurity: No Food Insecurity  . Worried About Programme researcher, broadcasting/film/video in the Last Year: Never true  . Ran Out of Food in the Last Year: Never true  Transportation Needs: No Transportation Needs  . Lack of Transportation (Medical): No   . Lack of Transportation (Non-Medical): No  Physical Activity: Insufficiently Active  . Days of Exercise per Week: 3 days  . Minutes of Exercise per Session: 30 min  Stress: No Stress Concern Present  . Feeling of Stress : Not at all  Social Connections: Moderately Integrated  . Frequency of Communication with Friends and Family: Once a week  . Frequency of Social Gatherings with Friends and Family: More than three times a week  . Attends Religious Services: 1 to 4 times per year  . Active Member of Clubs or Organizations: No  . Attends Banker Meetings: Never  . Marital Status: Married    Family History  Problem Relation Age of Onset  . Aortic aneurysm Mother   . Renal Disease Father   . Colon cancer Sister   . Throat cancer Brother   . Heart attack Sister   . Other Sister        tahycardia  . Other Brother        heart valve surgery     Current Outpatient Medications:  .  CALCIUM PO, Take by  mouth., Disp: , Rfl:  .  estradiol (VIVELLE-DOT) 0.05 MG/24HR patch, 1 patch 2 (two) times a week., Disp: , Rfl:  .  FLUoxetine (PROZAC) 20 MG capsule, TAKE 1 CAPSULE(20 MG) BY MOUTH DAILY, Disp: 30 capsule, Rfl: 3 .  levothyroxine (SYNTHROID) 75 MCG tablet, TAKE 1 TABLET(75 MCG) BY MOUTH DAILY BEFORE AND BREAKFAST, Disp: 90 tablet, Rfl: 0 .  MAGNESIUM PO, Take by mouth., Disp: , Rfl:  .  metroNIDAZOLE (METROGEL) 1 % gel, SMARTSIG:1 Topical Every Night, Disp: , Rfl:  .  Multiple Vitamins-Minerals (MULTI VITAMIN/MINERALS) TABS, Take 1 tablet by mouth daily., Disp: , Rfl:  .  omeprazole (PRILOSEC OTC) 20 MG tablet, , Disp: , Rfl:  .  VITAMIN D PO, Take by mouth., Disp: , Rfl:   Review of Systems  Review of Systems  Constitutional: Negative for fever, chills, weight loss, malaise/fatigue and diaphoresis.  HENT: Negative for hearing loss, ear pain, nosebleeds, congestion, sore throat, neck pain, tinnitus and ear discharge.   Eyes: Negative for blurred vision, double  vision, photophobia, pain, discharge and redness.  Respiratory: Negative for cough, hemoptysis, sputum production, shortness of breath, wheezing and stridor.   Cardiovascular: Negative for chest pain, palpitations, orthopnea, claudication, leg swelling and PND.  Gastrointestinal: negative for abdominal pain. Negative for heartburn, nausea, vomiting, diarrhea, constipation, blood in stool and melena.  Genitourinary: Negative for dysuria, urgency, frequency, hematuria and flank pain.  Musculoskeletal: Negative for myalgias, back pain, joint pain and falls.  Skin: Negative for itching and rash.  Neurological: Negative for dizziness, tingling, tremors, sensory change, speech change, focal weakness, seizures, loss of consciousness, weakness and headaches.  Endo/Heme/Allergies: Negative for environmental allergies and polydipsia. Does not bruise/bleed easily.  Psychiatric/Behavioral: Negative for depression, suicidal ideas, hallucinations, memory loss and substance abuse. The patient is not nervous/anxious and does not have insomnia.        Objective:  Blood pressure (!) 147/86, pulse 66, height 5\' 7"  (1.702 m), weight 198 lb (89.8 kg).   Physical Exam  Vitals reviewed. Constitutional: She is oriented to person, place, and time. She appears well-developed and well-nourished.  HENT:  Head: Normocephalic and atraumatic.        Right Ear: External ear normal.  Left Ear: External ear normal.  Nose: Nose normal.  Mouth/Throat: Oropharynx is clear and moist.  Eyes: Conjunctivae and EOM are normal. Pupils are equal, round, and reactive to light. Right eye exhibits no discharge. Left eye exhibits no discharge. No scleral icterus.  Neck: Normal range of motion. Neck supple. No tracheal deviation present. No thyromegaly present.  Cardiovascular: Normal rate, regular rhythm, normal heart sounds and intact distal pulses.  Exam reveals no gallop and no friction rub.   No murmur heard. Respiratory: Effort  normal and breath sounds normal. No respiratory distress. She has no wheezes. She has no rales. She exhibits no tenderness.  GI: Soft. Bowel sounds are normal. She exhibits no distension and no mass. There is no tenderness. There is no rebound and no guarding.  Genitourinary:  Breasts no masses skin changes or nipple changes bilaterally      Vulva is normal without lesions Vagina is pink moist without discharge Cervix absent Uterus is absent Adnexa is negative  {Rectal    hemoccult negative, normal tone, no masses  Musculoskeletal: Normal range of motion. She exhibits no edema and no tenderness.  Neurological: She is alert and oriented to person, place, and time. She has normal reflexes. She displays normal reflexes. No cranial nerve deficit. She exhibits  normal muscle tone. Coordination normal.  Skin: Skin is warm and dry. No rash noted. No erythema. No pallor.  Psychiatric: She has a normal mood and affect. Her behavior is normal. Judgment and thought content normal.       Medications Ordered at today's visit: No orders of the defined types were placed in this encounter.   Other orders placed at today's visit: Orders Placed This Encounter  Procedures  . MM 3D SCREEN BREAST BILATERAL  . POCT occult blood stool      Assessment:    Normal Gyn exam.    Plan:    Hormone replacement therapy: hormone replacement therapy: vivelle dot 0.1 mg. Mammogram ordered. Follow up in: 3 years.     No follow-ups on file.

## 2020-08-28 ENCOUNTER — Other Ambulatory Visit (HOSPITAL_COMMUNITY): Payer: Self-pay | Admitting: Obstetrics & Gynecology

## 2020-08-28 DIAGNOSIS — Z1231 Encounter for screening mammogram for malignant neoplasm of breast: Secondary | ICD-10-CM

## 2020-08-29 ENCOUNTER — Encounter (HOSPITAL_COMMUNITY): Payer: Self-pay

## 2020-08-29 ENCOUNTER — Ambulatory Visit (HOSPITAL_COMMUNITY): Admission: RE | Admit: 2020-08-29 | Payer: Federal, State, Local not specified - PPO | Source: Ambulatory Visit

## 2020-08-29 DIAGNOSIS — Z1231 Encounter for screening mammogram for malignant neoplasm of breast: Secondary | ICD-10-CM

## 2020-08-30 ENCOUNTER — Other Ambulatory Visit (HOSPITAL_COMMUNITY): Payer: Self-pay | Admitting: Obstetrics & Gynecology

## 2020-08-30 DIAGNOSIS — Z1231 Encounter for screening mammogram for malignant neoplasm of breast: Secondary | ICD-10-CM

## 2020-08-31 ENCOUNTER — Ambulatory Visit (HOSPITAL_COMMUNITY)
Admission: RE | Admit: 2020-08-31 | Discharge: 2020-08-31 | Disposition: A | Discharge status: Home / Self Care | Payer: Federal, State, Local not specified - PPO | Source: Ambulatory Visit | Attending: Obstetrics & Gynecology | Admitting: Obstetrics & Gynecology

## 2020-08-31 DIAGNOSIS — Z1231 Encounter for screening mammogram for malignant neoplasm of breast: Secondary | ICD-10-CM | POA: Diagnosis not present

## 2020-09-13 ENCOUNTER — Other Ambulatory Visit: Payer: Self-pay

## 2020-09-13 ENCOUNTER — Encounter: Payer: Self-pay | Admitting: Internal Medicine

## 2020-09-13 ENCOUNTER — Ambulatory Visit: Payer: Federal, State, Local not specified - PPO | Admitting: Internal Medicine

## 2020-09-13 ENCOUNTER — Other Ambulatory Visit: Payer: Self-pay | Admitting: Internal Medicine

## 2020-09-13 VITALS — BP 147/74 | HR 66 | Temp 98.4°F | Resp 18 | Ht 67.0 in | Wt 201.4 lb

## 2020-09-13 DIAGNOSIS — E041 Nontoxic single thyroid nodule: Secondary | ICD-10-CM | POA: Diagnosis not present

## 2020-09-13 DIAGNOSIS — G2581 Restless legs syndrome: Secondary | ICD-10-CM | POA: Diagnosis not present

## 2020-09-13 DIAGNOSIS — E890 Postprocedural hypothyroidism: Secondary | ICD-10-CM | POA: Diagnosis not present

## 2020-09-13 MED ORDER — ROPINIROLE HCL 0.5 MG PO TABS: 0.5000 mg | ORAL_TABLET | Freq: Every day | ORAL | 1 refills | Status: DC

## 2020-09-13 NOTE — Assessment & Plan Note (Signed)
Leg pain and urge to move points towards restless legs syndrome Will check iron profile, CK and TSH Started Ropinirole 0.5 mg qHS for now Continue Mg supplement

## 2020-09-13 NOTE — Progress Notes (Signed)
Acute Office Visit  Subjective:    Patient ID: Molly Knight, female    DOB: 12-May-1955, 65 y.o.   MRN: [id number]  Chief Complaint  Patient presents with  . Leg Pain    Has been having leg pains for awhile however here lately worse cant sleep at night left leg is worse than right but the right hurts also     HPI Patient is in today for evaluation of chronic leg pain, which is cramping in nature, around the calf and thigh area and is worse at nighttime. She has had such pain for many years, but it has been worse for last few weeks. She has constant urge to move the leg and it gets better when she stands or starts walking. Denies any recent injury.  Past Medical History:  Diagnosis Date  . Depression   . Thyroid disease     Past Surgical History:  Procedure Laterality Date  . ABDOMINAL HYSTERECTOMY  2003  . bladder tach    . CESAREAN SECTION    . SHOULDER SURGERY Bilateral 02/2019  . THYROIDECTOMY, PARTIAL  10/2016    Family History  Problem Relation Age of Onset  . Aortic aneurysm Mother   . Renal Disease Father   . Colon cancer Sister   . Throat cancer Brother   . Heart attack Sister   . Other Sister        tahycardia  . Other Brother        heart valve surgery    Social History   Socioeconomic History  . Marital status: Married    Spouse name: Not on file  . Number of children: Not on file  . Years of education: Not on file  . Highest education level: Not on file  Occupational History  . Not on file  Tobacco Use  . Smoking status: Never Smoker  . Smokeless tobacco: Never Used  Vaping Use  . Vaping Use: Never used  Substance and Sexual Activity  . Alcohol use: Not Currently  . Drug use: Never  . Sexual activity: Not Currently    Birth control/protection: Surgical    Comment: hyst  Other Topics Concern  . Not on file  Social History Narrative  . Not on file   Social Determinants of Health   Financial Resource Strain: Low Risk   .  Difficulty of Paying Living Expenses: Not hard at all  Food Insecurity: No Food Insecurity  . Worried About Programme researcher, broadcasting/film/video in the Last Year: Never true  . Ran Out of Food in the Last Year: Never true  Transportation Needs: No Transportation Needs  . Lack of Transportation (Medical): No  . Lack of Transportation (Non-Medical): No  Physical Activity: Insufficiently Active  . Days of Exercise per Week: 3 days  . Minutes of Exercise per Session: 30 min  Stress: No Stress Concern Present  . Feeling of Stress : Not at all  Social Connections: Moderately Integrated  . Frequency of Communication with Friends and Family: Once a week  . Frequency of Social Gatherings with Friends and Family: More than three times a week  . Attends Religious Services: 1 to 4 times per year  . Active Member of Clubs or Organizations: No  . Attends Banker Meetings: Never  . Marital Status: Married  Catering manager Violence: Not At Risk  . Fear of Current or Ex-Partner: No  . Emotionally Abused: No  . Physically Abused: No  . Sexually Abused: No  Outpatient Medications Prior to Visit  Medication Sig Dispense Refill  . CALCIUM PO Take by mouth.    . estradiol (VIVELLE-DOT) 0.05 MG/24HR patch Place 1 patch (0.05 mg total) onto the skin 2 (two) times a week. 24 patch 3  . FLUoxetine (PROZAC) 20 MG capsule TAKE 1 CAPSULE(20 MG) BY MOUTH DAILY 30 capsule 3  . levothyroxine (SYNTHROID) 75 MCG tablet TAKE 1 TABLET(75 MCG) BY MOUTH DAILY BEFORE AND BREAKFAST 90 tablet 0  . MAGNESIUM PO Take by mouth.    . metroNIDAZOLE (METROGEL) 1 % gel SMARTSIG:1 Topical Every Night    . Multiple Vitamins-Minerals (MULTI VITAMIN/MINERALS) TABS Take 1 tablet by mouth daily.    Marland Kitchen omeprazole (PRILOSEC OTC) 20 MG tablet     . VITAMIN D PO Take by mouth.     No facility-administered medications prior to visit.    No Known Allergies  Review of Systems  Constitutional: Negative for chills and fever.   Respiratory: Negative for cough and shortness of breath.   Cardiovascular: Negative for chest pain and palpitations.  Musculoskeletal: Negative for gait problem, neck pain and neck stiffness.       B/l leg pain  Skin: Negative for rash.  Neurological: Negative for dizziness, tremors, weakness and numbness.       Objective:    Physical Exam Vitals reviewed.  Constitutional:      General: She is not in acute distress.    Appearance: She is not diaphoretic.  HENT:     Head: Normocephalic and atraumatic.     Nose: Nose normal. No congestion.     Mouth/Throat:     Mouth: Mucous membranes are moist.     Pharynx: No posterior oropharyngeal erythema.  Eyes:     General: No scleral icterus.    Extraocular Movements: Extraocular movements intact.  Cardiovascular:     Rate and Rhythm: Normal rate and regular rhythm.     Pulses: Normal pulses.     Heart sounds: Normal heart sounds. No murmur heard.   Pulmonary:     Breath sounds: Normal breath sounds. No wheezing or rales.  Musculoskeletal:     Cervical back: Neck supple. No tenderness.     Right lower leg: No edema.     Left lower leg: No edema.     Comments: DPA pulse 2+ b/l  Skin:    General: Skin is warm.     Findings: No rash.  Neurological:     General: No focal deficit present.     Mental Status: She is alert and oriented to person, place, and time.     Sensory: No sensory deficit.     Motor: No weakness.  Psychiatric:        Mood and Affect: Mood normal.        Behavior: Behavior normal.     BP (!) 147/74 (BP Location: Left Arm, Patient Position: Sitting, Cuff Size: Normal)   Pulse 66   Temp 98.4 F (36.9 C) (Oral)   Resp 18   Ht 5\' 7"  (1.702 m)   Wt 201 lb 6.4 oz (91.4 kg)   LMP  (LMP Unknown)   SpO2 98%   BMI 31.54 kg/m  Wt Readings from Last 3 Encounters:  09/13/20 201 lb 6.4 oz (91.4 kg)  08/27/20 198 lb (89.8 kg)  06/25/20 199 lb 6.4 oz (90.4 kg)    Health Maintenance Due  Topic Date Due  .  HIV Screening  Never done  . Hepatitis C Screening  Never  done  . TETANUS/TDAP  Never done  . Zoster Vaccines- Shingrix (1 of 2) Never done    There are no preventive care reminders to display for this patient.   Lab Results  Component Value Date   TSH 2.030 05/01/2020   Lab Results  Component Value Date   WBC 5.7 05/01/2020   HGB 13.4 05/01/2020   HCT 40.4 05/01/2020   MCV 92 05/01/2020   PLT 309 05/01/2020   Lab Results  Component Value Date   NA 138 05/01/2020   K 4.8 05/01/2020   CO2 26 05/01/2020   GLUCOSE 102 (H) 05/01/2020   BUN 12 05/01/2020   CREATININE 0.79 05/01/2020   BILITOT 0.3 05/01/2020   ALKPHOS 84 05/01/2020   AST 16 05/01/2020   ALT 16 05/01/2020   PROT 6.5 05/01/2020   ALBUMIN 4.2 05/01/2020   CALCIUM 9.1 05/01/2020   Lab Results  Component Value Date   CHOL 238 (H) 05/01/2020   Lab Results  Component Value Date   HDL 78 05/01/2020   Lab Results  Component Value Date   LDLCALC 149 (H) 05/01/2020   Lab Results  Component Value Date   TRIG 67 05/01/2020   Lab Results  Component Value Date   CHOLHDL 3.1 05/01/2020   Lab Results  Component Value Date   HGBA1C 5.4 05/01/2020       Assessment & Plan:   Problem List Items Addressed This Visit      Other   Restless legs syndrome - Primary    Leg pain and urge to move points towards restless legs syndrome Will check iron profile, CK and TSH Started Ropinirole 0.5 mg qHS for now Continue Mg supplement      Relevant Medications   rOPINIRole (REQUIP) 0.5 MG tablet   Other Relevant Orders   Fe+TIBC+Fer   TSH   CK (Creatine Kinase)       Meds ordered this encounter  Medications  . rOPINIRole (REQUIP) 0.5 MG tablet    Sig: Take 1 tablet (0.5 mg total) by mouth at bedtime.    Dispense:  30 tablet    Refill:  1     Nandan Willems Concha Se, MD

## 2020-09-13 NOTE — Patient Instructions (Signed)
Please start taking Ropinirole as prescribed.  Please continue taking Magnesium supplement.  Make sure to take adequate hydration, at least 64 ounces of fluid in a day.

## 2020-09-15 LAB — CK: Total CK: 112 U/L (ref 32–182)

## 2020-09-15 LAB — IRON,TIBC AND FERRITIN PANEL
Ferritin: 40 ng/mL (ref 15–150)
Iron Saturation: 23 % (ref 15–55)
Iron: 70 ug/dL (ref 27–139)
Total Iron Binding Capacity: 304 ug/dL (ref 250–450)
UIBC: 234 ug/dL (ref 118–369)

## 2020-09-15 LAB — TSH: TSH: 1.57 u[IU]/mL (ref 0.450–4.500)

## 2020-10-05 ENCOUNTER — Other Ambulatory Visit: Payer: Self-pay | Admitting: Internal Medicine

## 2020-10-05 DIAGNOSIS — E890 Postprocedural hypothyroidism: Secondary | ICD-10-CM

## 2020-10-05 DIAGNOSIS — F320 Major depressive disorder, single episode, mild: Secondary | ICD-10-CM

## 2020-10-25 ENCOUNTER — Ambulatory Visit: Payer: Federal, State, Local not specified - PPO | Admitting: Internal Medicine

## 2020-12-06 ENCOUNTER — Ambulatory Visit: Payer: Federal, State, Local not specified - PPO | Admitting: Internal Medicine

## 2020-12-06 ENCOUNTER — Encounter: Payer: Self-pay | Admitting: Internal Medicine

## 2020-12-06 ENCOUNTER — Other Ambulatory Visit: Payer: Self-pay

## 2020-12-06 VITALS — BP 138/78 | HR 57 | Temp 97.8°F | Resp 20 | Ht 67.0 in | Wt 199.0 lb

## 2020-12-06 DIAGNOSIS — G2581 Restless legs syndrome: Secondary | ICD-10-CM

## 2020-12-06 DIAGNOSIS — R232 Flushing: Secondary | ICD-10-CM

## 2020-12-06 DIAGNOSIS — E890 Postprocedural hypothyroidism: Secondary | ICD-10-CM

## 2020-12-06 DIAGNOSIS — F32A Depression, unspecified: Secondary | ICD-10-CM

## 2020-12-06 NOTE — Patient Instructions (Signed)
Please continue taking medications as prescribed.  Please continue taking Magnesium supplement.

## 2020-12-06 NOTE — Assessment & Plan Note (Signed)
Leg pain and urge to move points towards restless legs syndrome - now better with Magnesium supplements, did not continue Ropinirole Reviewed iron profile, CK and TSH - wnl Continue Mg supplement

## 2020-12-06 NOTE — Progress Notes (Signed)
Established Patient Office Visit  Subjective:  Patient ID: Molly Knight, female    DOB: [birth year]  Age: 65 y.o. MRN: [id number]  CC:  Chief Complaint  Patient presents with   Hypertension   Hypothyroidism    HPI Molly Knight is a 65 year old female with PMH of postoperative hypothyroidism, hot flashes and depression who presents for follow up of her chronic medical conditions.  She has been doing well overall.  Prehypertension: BP is well-controlled. Patient denies headache, dizziness, chest pain, dyspnea or palpitations.  Hot flashes: Well-controlled with Estradiol patch and Prozac.  Leg cramps: Have been feeling better with Magnesium supplements. She has stopped taking Ropinirole. States that her urge to move legs have improved with magnesium supplements alone. Iron profile was normal. Denies any tremors, daytime fatigue or sleep problem.     Past Medical History:  Diagnosis Date   Depression    Thyroid disease    Thyroid nodule 09/27/2015   Last Assessment & Plan:  Formatting of this note might be different from the original. Stable.  Her left lobe is normal in size on physical exam with no palpable nodule.    Past Surgical History:  Procedure Laterality Date   ABDOMINAL HYSTERECTOMY  2003   bladder tach     CESAREAN SECTION     SHOULDER SURGERY Bilateral 02/2019   THYROIDECTOMY, PARTIAL  10/2016    Family History  Problem Relation Age of Onset   Aortic aneurysm Mother    Renal Disease Father    Colon cancer Sister    Throat cancer Brother    Heart attack Sister    Other Sister        tahycardia   Other Brother        heart valve surgery    Social History   Socioeconomic History   Marital status: Married    Spouse name: Not on file   Number of children: Not on file   Years of education: Not on file   Highest education level: Not on file  Occupational History   Not on file  Tobacco Use   Smoking status: Never   Smokeless tobacco:  Never  Vaping Use   Vaping Use: Never used  Substance and Sexual Activity   Alcohol use: Not Currently   Drug use: Never   Sexual activity: Not Currently    Birth control/protection: Surgical    Comment: hyst  Other Topics Concern   Not on file  Social History Narrative   Not on file   Social Determinants of Health   Financial Resource Strain: Low Risk    Difficulty of Paying Living Expenses: Not hard at all  Food Insecurity: No Food Insecurity   Worried About Programme researcher, broadcasting/film/video in the Last Year: Never true   Ran Out of Food in the Last Year: Never true  Transportation Needs: No Transportation Needs   Lack of Transportation (Medical): No   Lack of Transportation (Non-Medical): No  Physical Activity: Insufficiently Active   Days of Exercise per Week: 3 days   Minutes of Exercise per Session: 30 min  Stress: No Stress Concern Present   Feeling of Stress : Not at all  Social Connections: Moderately Integrated   Frequency of Communication with Friends and Family: Once a week   Frequency of Social Gatherings with Friends and Family: More than three times a week   Attends Religious Services: 1 to 4 times per year   Active Member of Clubs or Organizations: No  Attends Banker Meetings: Never   Marital Status: Married  Catering manager Violence: Not At Risk   Fear of Current or Ex-Partner: No   Emotionally Abused: No   Physically Abused: No   Sexually Abused: No    Outpatient Medications Prior to Visit  Medication Sig Dispense Refill   CALCIUM PO Take by mouth.     estradiol (VIVELLE-DOT) 0.05 MG/24HR patch Place 1 patch (0.05 mg total) onto the skin 2 (two) times a week. 24 patch 3   FLUoxetine (PROZAC) 20 MG capsule TAKE 1 CAPSULE(20 MG) BY MOUTH DAILY 30 capsule 3   levothyroxine (SYNTHROID) 75 MCG tablet TAKE 1 TABLET(75 MCG) BY MOUTH DAILY BEFORE BREAKFAST 90 tablet 0   MAGNESIUM PO Take by mouth.     metroNIDAZOLE (METROGEL) 1 % gel SMARTSIG:1 Topical  Every Night     Multiple Vitamins-Minerals (MULTI VITAMIN/MINERALS) TABS Take 1 tablet by mouth daily.     omeprazole (PRILOSEC OTC) 20 MG tablet      VITAMIN D PO Take by mouth.     rOPINIRole (REQUIP) 0.5 MG tablet TAKE 1 TABLET(0.5 MG) BY MOUTH AT BEDTIME 90 tablet 0   No facility-administered medications prior to visit.    No Known Allergies  ROS Review of Systems  Constitutional:  Negative for chills and fever.  HENT:  Negative for congestion, sinus pressure, sinus pain and sore throat.   Eyes:  Negative for pain and discharge.  Respiratory:  Negative for cough and shortness of breath.   Cardiovascular:  Negative for chest pain and palpitations.  Gastrointestinal:  Negative for abdominal pain, constipation, diarrhea, nausea and vomiting.  Endocrine: Negative for polydipsia and polyuria.  Genitourinary:  Negative for dysuria and hematuria.  Musculoskeletal:  Negative for neck pain and neck stiffness.  Skin:  Negative for rash.  Neurological:  Negative for dizziness and weakness.  Psychiatric/Behavioral:  Negative for agitation and behavioral problems.      Objective:    Physical Exam Vitals reviewed.  Constitutional:      General: She is not in acute distress.    Appearance: She is not diaphoretic.  HENT:     Head: Normocephalic and atraumatic.     Nose: Nose normal. No congestion.     Mouth/Throat:     Mouth: Mucous membranes are moist.     Pharynx: No posterior oropharyngeal erythema.  Eyes:     General: No scleral icterus.    Extraocular Movements: Extraocular movements intact.  Cardiovascular:     Rate and Rhythm: Normal rate and regular rhythm.     Pulses: Normal pulses.     Heart sounds: Normal heart sounds. No murmur heard. Pulmonary:     Breath sounds: Normal breath sounds. No wheezing or rales.  Musculoskeletal:     Cervical back: Neck supple. No tenderness.     Right lower leg: No edema.     Left lower leg: No edema.  Skin:    General: Skin is warm.      Findings: No rash.  Neurological:     General: No focal deficit present.     Mental Status: She is alert and oriented to person, place, and time.     Sensory: No sensory deficit.     Motor: No weakness.  Psychiatric:        Mood and Affect: Mood normal.        Behavior: Behavior normal.    BP 138/78 (BP Location: Left Arm, Patient Position: Sitting, Cuff Size: Large)  Pulse (!) 57   Temp 97.8 F (36.6 C)   Resp 20   Ht 5\' 7"  (1.702 m)   Wt 199 lb (90.3 kg)   LMP  (LMP Unknown)   SpO2 96%   BMI 31.17 kg/m  Wt Readings from Last 3 Encounters:  12/06/20 199 lb (90.3 kg)  09/13/20 201 lb 6.4 oz (91.4 kg)  08/27/20 198 lb (89.8 kg)     Health Maintenance Due  Topic Date Due   HIV Screening  Never done   Hepatitis C Screening  Never done   TETANUS/TDAP  Never done   Zoster Vaccines- Shingrix (1 of 2) Never done   COVID-19 Vaccine (4 - Booster for Moderna series) 08/03/2020   INFLUENZA VACCINE  11/19/2020    There are no preventive care reminders to display for this patient.  Lab Results  Component Value Date   TSH 1.570 09/13/2020   Lab Results  Component Value Date   WBC 5.7 05/01/2020   HGB 13.4 05/01/2020   HCT 40.4 05/01/2020   MCV 92 05/01/2020   PLT 309 05/01/2020   Lab Results  Component Value Date   NA 138 05/01/2020   K 4.8 05/01/2020   CO2 26 05/01/2020   GLUCOSE 102 (H) 05/01/2020   BUN 12 05/01/2020   CREATININE 0.79 05/01/2020   BILITOT 0.3 05/01/2020   ALKPHOS 84 05/01/2020   AST 16 05/01/2020   ALT 16 05/01/2020   PROT 6.5 05/01/2020   ALBUMIN 4.2 05/01/2020   CALCIUM 9.1 05/01/2020   Lab Results  Component Value Date   CHOL 238 (H) 05/01/2020   Lab Results  Component Value Date   HDL 78 05/01/2020   Lab Results  Component Value Date   LDLCALC 149 (H) 05/01/2020   Lab Results  Component Value Date   TRIG 67 05/01/2020   Lab Results  Component Value Date   CHOLHDL 3.1 05/01/2020   Lab Results  Component Value Date    HGBA1C 5.4 05/01/2020      Assessment & Plan:   Problem List Items Addressed This Visit       Cardiovascular and Mediastinum   Hot flashes    Well-controlled On Estradiol patch and Prozac        Endocrine   Postoperative hypothyroidism - Primary    Due to h/o multinodular goiter Lab Results  Component Value Date   TSH 1.570 09/13/2020   Takes Levothyroxine 75 mcg QD        Other   Depression    Flowsheet Row Office Visit from 12/06/2020 in [facility]  PHQ-9 Total Score 0  On Prozac, now better since moving to a new home Sleep is better as well.      Restless legs syndrome    Leg pain and urge to move points towards restless legs syndrome - now better with Magnesium supplements, did not continue Ropinirole Reviewed iron profile, CK and TSH - wnl Continue Mg supplement       No orders of the defined types were placed in this encounter.   Follow-up: Return in about 6 months (around 06/08/2021) for Annual physical.    06/10/2021, MD

## 2020-12-06 NOTE — Assessment & Plan Note (Signed)
Due to h/o multinodular goiter Lab Results  Component Value Date   TSH 1.570 09/13/2020   Takes Levothyroxine 75 mcg QD

## 2020-12-06 NOTE — Assessment & Plan Note (Addendum)
Flowsheet Row Office Visit from 12/06/2020 in [facility]  PHQ-9 Total Score 0     On Prozac, now better since moving to a new home Sleep is better as well.

## 2020-12-06 NOTE — Assessment & Plan Note (Signed)
Well-controlled On Estradiol patch and Prozac

## 2020-12-10 DIAGNOSIS — M722 Plantar fascial fibromatosis: Secondary | ICD-10-CM | POA: Diagnosis not present

## 2020-12-10 DIAGNOSIS — M79671 Pain in right foot: Secondary | ICD-10-CM | POA: Diagnosis not present

## 2020-12-15 ENCOUNTER — Other Ambulatory Visit: Payer: Self-pay

## 2020-12-15 ENCOUNTER — Emergency Department (HOSPITAL_COMMUNITY): Payer: Federal, State, Local not specified - PPO

## 2020-12-15 ENCOUNTER — Encounter (HOSPITAL_COMMUNITY): Payer: Self-pay | Admitting: Emergency Medicine

## 2020-12-15 ENCOUNTER — Emergency Department (HOSPITAL_COMMUNITY)
Admission: EM | Admit: 2020-12-15 | Discharge: 2020-12-16 | Disposition: A | Discharge status: Home / Self Care | Payer: Federal, State, Local not specified - PPO | Attending: Emergency Medicine | Admitting: Emergency Medicine

## 2020-12-15 ENCOUNTER — Emergency Department (HOSPITAL_COMMUNITY)
Admission: EM | Admit: 2020-12-15 | Discharge: 2020-12-15 | Discharge status: Against Medical Advice | Payer: Federal, State, Local not specified - PPO

## 2020-12-15 DIAGNOSIS — R110 Nausea: Secondary | ICD-10-CM | POA: Diagnosis not present

## 2020-12-15 DIAGNOSIS — R42 Dizziness and giddiness: Secondary | ICD-10-CM | POA: Diagnosis not present

## 2020-12-15 DIAGNOSIS — Z5321 Procedure and treatment not carried out due to patient leaving prior to being seen by health care provider: Secondary | ICD-10-CM | POA: Diagnosis not present

## 2020-12-15 LAB — COMPREHENSIVE METABOLIC PANEL
ALT: 19 U/L (ref 0–44)
AST: 22 U/L (ref 15–41)
Albumin: 3.9 g/dL (ref 3.5–5.0)
Alkaline Phosphatase: 68 U/L (ref 38–126)
Anion gap: 8 (ref 5–15)
BUN: 14 mg/dL (ref 8–23)
CO2: 26 mmol/L (ref 22–32)
Calcium: 9.3 mg/dL (ref 8.9–10.3)
Chloride: 102 mmol/L (ref 98–111)
Creatinine, Ser: 0.79 mg/dL (ref 0.44–1.00)
GFR, Estimated: >60 mL/min (ref >60)
Glucose, Bld: 118 mg/dL — ABNORMAL HIGH (ref 70–99)
Potassium: 3.8 mmol/L (ref 3.5–5.1)
Sodium: 136 mmol/L (ref 135–145)
Total Bilirubin: 0.6 mg/dL (ref 0.3–1.2)
Total Protein: 6.9 g/dL (ref 6.5–8.1)

## 2020-12-15 LAB — CBC WITH DIFFERENTIAL/PLATELET
Abs Immature Granulocytes: 0.05 10*3/uL (ref 0.00–0.07)
Basophils Absolute: 0.1 10*3/uL (ref 0.0–0.1)
Basophils Relative: 1 %
Eosinophils Absolute: 0.1 10*3/uL (ref 0.0–0.5)
Eosinophils Relative: 1 %
HCT: 40.6 % (ref 36.0–46.0)
Hemoglobin: 13.6 g/dL (ref 12.0–15.0)
Immature Granulocytes: 1 %
Lymphocytes Relative: 22 %
Lymphs Abs: 2.1 10*3/uL (ref 0.7–4.0)
MCH: 31.3 pg (ref 26.0–34.0)
MCHC: 33.5 g/dL (ref 30.0–36.0)
MCV: 93.5 fL (ref 80.0–100.0)
Monocytes Absolute: 0.4 10*3/uL (ref 0.1–1.0)
Monocytes Relative: 5 %
Neutro Abs: 6.7 10*3/uL (ref 1.7–7.7)
Neutrophils Relative %: 70 %
Platelets: 284 10*3/uL (ref 150–400)
RBC: 4.34 MIL/uL (ref 3.87–5.11)
RDW: 12.2 % (ref 11.5–15.5)
WBC: 9.4 10*3/uL (ref 4.0–10.5)
nRBC: 0 % (ref 0.0–0.2)

## 2020-12-15 LAB — TROPONIN I (HIGH SENSITIVITY): Troponin I (High Sensitivity): 5 ng/L (ref <18)

## 2020-12-15 MED ORDER — MECLIZINE HCL 25 MG PO TABS
25.0000 mg | ORAL_TABLET | Freq: Once | ORAL | Status: AC
  Administered 2020-12-15: 25 mg via ORAL
  Filled 2020-12-15: qty 1

## 2020-12-15 NOTE — ED Triage Notes (Signed)
Pt here from home with c/o dizziness and some slight nausea after trying to get up off the couch , pt alert and oriented

## 2020-12-15 NOTE — ED Provider Notes (Addendum)
Emergency Medicine Provider Triage Evaluation Note  Molly Knight , a 65 y.o. female  was evaluated in triage.  Pt complains of near syncope and dizziness. Was fine until after dinner. laying on couch, sat up felt dizzy and immediately had episode of emesis. Dizziness worse with movement, looking down, turning head. Helps when staying still and closing eyes. No HA, numbness, weakness. Did feel "cold" all over. No tinnitus. No hx of head trauma. No hx of vertigo. No recent illness, fever, neck pain, CP, SOB, abd pain, back pain.  Review of Systems  Positive: Dizziness, near syncope, emesis Negative: HA, numbness, weakness, CP, Sob, abd pain, back pain  Physical Exam  LMP  (LMP Unknown)  Gen:   Awake, no distress   Resp:  Normal effort  Cardiac: clear MSK:   Moves extremities without difficulty  Neuro:  Cn 2-12 grossly intact, equal strength, intact sensation Other:    Medical Decision Making  Medically screening exam initiated at 9:46 PM.  Appropriate orders placed.  Molly Knight was informed that the remainder of the evaluation will be completed by another provider, this initial triage assessment does not replace that evaluation, and the importance of remaining in the ED until their evaluation is complete.  Dizziness, near syncope, emesis     Encarnacion Scioneaux A, PA-C 12/15/20 2150    Tegeler, Canary Brim, MD 12/16/20 [phone]

## 2020-12-16 LAB — TROPONIN I (HIGH SENSITIVITY): Troponin I (High Sensitivity): 8 ng/L (ref <18)

## 2020-12-16 NOTE — ED Notes (Signed)
Pt left after husband created a scene in lobby. Pt gait was steady

## 2020-12-18 ENCOUNTER — Ambulatory Visit: Payer: Federal, State, Local not specified - PPO | Admitting: Internal Medicine

## 2020-12-18 ENCOUNTER — Encounter: Payer: Self-pay | Admitting: Internal Medicine

## 2020-12-18 ENCOUNTER — Other Ambulatory Visit: Payer: Self-pay

## 2020-12-18 VITALS — BP 159/90 | HR 65 | Temp 98.4°F | Resp 18 | Ht 67.0 in | Wt 200.0 lb

## 2020-12-18 DIAGNOSIS — Z23 Encounter for immunization: Secondary | ICD-10-CM

## 2020-12-18 DIAGNOSIS — H8110 Benign paroxysmal vertigo, unspecified ear: Secondary | ICD-10-CM | POA: Diagnosis not present

## 2020-12-18 DIAGNOSIS — Z09 Encounter for follow-up examination after completed treatment for conditions other than malignant neoplasm: Secondary | ICD-10-CM

## 2020-12-18 MED ORDER — MECLIZINE HCL 25 MG PO TABS: 25.0000 mg | ORAL_TABLET | Freq: Three times a day (TID) | ORAL | 1 refills | Status: DC | PRN

## 2020-12-18 NOTE — Patient Instructions (Addendum)
Please take Meclizine as needed for vertigo.  Please avoid sudden positional changes.  Please eat at regular intervals and avoid skipping any meal. Please continue to take at least 64 ounces of fluid.  Please perform vestibular exercises as shown in the video. Inner Ear Balance Home Exercises to Treat Dizziness (Vestibular Home Exercises)

## 2020-12-18 NOTE — Progress Notes (Signed)
Acute Office Visit  Subjective:    Patient ID: Molly Knight, female    DOB: 08-20-1955, 65 y.o.   MRN: [id number]  Chief Complaint  Patient presents with   Dizziness    Pt has had dizzy spells and nauseas it comes and goes this has been going on for about a week pt was given meds for vertigo at  and this helped     HPI Patient is in today for evaluation of dizziness and nausea for last 1 week. She describes it as room spinning sensation. Denies any recent URTI. She went to ER and was given Meclizine in ER, which improved her symptoms. Denies any recent fall. Denies any ear pain or discharge.  Past Medical History:  Diagnosis Date   Depression    Thyroid disease    Thyroid nodule 09/27/2015   Last Assessment & Plan:  Formatting of this note might be different from the original. Stable.  Her left lobe is normal in size on physical exam with no palpable nodule.    Past Surgical History:  Procedure Laterality Date   ABDOMINAL HYSTERECTOMY  2003   bladder tach     CESAREAN SECTION     SHOULDER SURGERY Bilateral 02/2019   THYROIDECTOMY, PARTIAL  10/2016    Family History  Problem Relation Age of Onset   Aortic aneurysm Mother    Renal Disease Father    Colon cancer Sister    Throat cancer Brother    Heart attack Sister    Other Sister        tahycardia   Other Brother        heart valve surgery    Social History   Socioeconomic History   Marital status: Married    Spouse name: Not on file   Number of children: Not on file   Years of education: Not on file   Highest education level: Not on file  Occupational History   Not on file  Tobacco Use   Smoking status: Never   Smokeless tobacco: Never  Vaping Use   Vaping Use: Never used  Substance and Sexual Activity   Alcohol use: Not Currently   Drug use: Never   Sexual activity: Not Currently    Birth control/protection: Surgical    Comment: hyst  Other Topics Concern   Not on file  Social  History Narrative   Not on file   Social Determinants of Health   Financial Resource Strain: Low Risk    Difficulty of Paying Living Expenses: Not hard at all  Food Insecurity: No Food Insecurity   Worried About Programme researcher, broadcasting/film/video in the Last Year: Never true   Ran Out of Food in the Last Year: Never true  Transportation Needs: No Transportation Needs   Lack of Transportation (Medical): No   Lack of Transportation (Non-Medical): No  Physical Activity: Insufficiently Active   Days of Exercise per Week: 3 days   Minutes of Exercise per Session: 30 min  Stress: No Stress Concern Present   Feeling of Stress : Not at all  Social Connections: Moderately Integrated   Frequency of Communication with Friends and Family: Once a week   Frequency of Social Gatherings with Friends and Family: More than three times a week   Attends Religious Services: 1 to 4 times per year   Active Member of [employer] or Organizations: No   Attends Banker Meetings: Never   Marital Status: Married  Catering manager Violence: Not At  Risk   Fear of Current or Ex-Partner: No   Emotionally Abused: No   Physically Abused: No   Sexually Abused: No    Outpatient Medications Prior to Visit  Medication Sig Dispense Refill   CALCIUM PO Take by mouth.     estradiol (VIVELLE-DOT) 0.05 MG/24HR patch Place 1 patch (0.05 mg total) onto the skin 2 (two) times a week. 24 patch 3   FLUoxetine (PROZAC) 20 MG capsule TAKE 1 CAPSULE(20 MG) BY MOUTH DAILY 30 capsule 3   levothyroxine (SYNTHROID) 75 MCG tablet TAKE 1 TABLET(75 MCG) BY MOUTH DAILY BEFORE BREAKFAST 90 tablet 0   MAGNESIUM PO Take by mouth.     metroNIDAZOLE (METROGEL) 1 % gel SMARTSIG:1 Topical Every Night     Multiple Vitamins-Minerals (MULTI VITAMIN/MINERALS) TABS Take 1 tablet by mouth daily.     omeprazole (PRILOSEC OTC) 20 MG tablet      VITAMIN D PO Take by mouth.     No facility-administered medications prior to visit.    No Known  Allergies  Review of Systems  Constitutional:  Negative for chills and fever.  HENT:  Negative for congestion, sinus pressure, sinus pain and sore throat.   Eyes:  Negative for pain and discharge.  Respiratory:  Negative for cough and shortness of breath.   Cardiovascular:  Negative for chest pain and palpitations.  Gastrointestinal:  Positive for nausea. Negative for abdominal pain, diarrhea and vomiting.  Endocrine: Negative for polydipsia and polyuria.  Genitourinary:  Negative for dysuria and hematuria.  Musculoskeletal:  Negative for neck pain and neck stiffness.  Skin:  Negative for rash.  Neurological:  Positive for dizziness. Negative for weakness.  Psychiatric/Behavioral:  Negative for agitation and behavioral problems.       Objective:    Physical Exam Vitals reviewed.  Constitutional:      General: She is not in acute distress.    Appearance: She is not diaphoretic.  HENT:     Head: Normocephalic and atraumatic.     Right Ear: Ear canal and external ear normal.     Left Ear: Ear canal and external ear normal.     Nose: Nose normal. No congestion.     Mouth/Throat:     Mouth: Mucous membranes are moist.     Pharynx: No posterior oropharyngeal erythema.  Eyes:     General: No scleral icterus.    Extraocular Movements: Extraocular movements intact.  Cardiovascular:     Rate and Rhythm: Normal rate and regular rhythm.     Pulses: Normal pulses.     Heart sounds: Normal heart sounds. No murmur heard. Pulmonary:     Breath sounds: Normal breath sounds. No wheezing or rales.  Musculoskeletal:     Cervical back: Neck supple. No tenderness.     Right lower leg: No edema.     Left lower leg: No edema.  Skin:    General: Skin is warm.     Findings: No rash.  Neurological:     General: No focal deficit present.     Mental Status: She is alert and oriented to person, place, and time.     Sensory: No sensory deficit.     Motor: No weakness.  Psychiatric:        Mood  and Affect: Mood normal.        Behavior: Behavior normal.    BP (!) 159/90 (BP Location: Left Arm, Patient Position: Sitting, Cuff Size: Normal)   Pulse 65   Temp 98.4 F (  36.9 C) (Oral)   Resp 18   Ht 5\' 7"  (1.702 m)   Wt 200 lb 0.6 oz (90.7 kg)   LMP  (LMP Unknown)   SpO2 96%   BMI 31.33 kg/m  Wt Readings from Last 3 Encounters:  12/18/20 200 lb 0.6 oz (90.7 kg)  12/06/20 199 lb (90.3 kg)  09/13/20 201 lb 6.4 oz (91.4 kg)    Health Maintenance Due  Topic Date Due   HIV Screening  Never done   Hepatitis C Screening  Never done   TETANUS/TDAP  Never done   Zoster Vaccines- Shingrix (1 of 2) Never done   COVID-19 Vaccine (4 - Booster for Moderna series) 08/03/2020    There are no preventive care reminders to display for this patient.   Lab Results  Component Value Date   TSH 1.570 09/13/2020   Lab Results  Component Value Date   WBC 9.4 12/15/2020   HGB 13.6 12/15/2020   HCT 40.6 12/15/2020   MCV 93.5 12/15/2020   PLT 284 12/15/2020   Lab Results  Component Value Date   NA 136 12/15/2020   K 3.8 12/15/2020   CO2 26 12/15/2020   GLUCOSE 118 (H) 12/15/2020   BUN 14 12/15/2020   CREATININE 0.79 12/15/2020   BILITOT 0.6 12/15/2020   ALKPHOS 68 12/15/2020   AST 22 12/15/2020   ALT 19 12/15/2020   PROT 6.9 12/15/2020   ALBUMIN 3.9 12/15/2020   CALCIUM 9.3 12/15/2020   ANIONGAP 8 12/15/2020   Lab Results  Component Value Date   CHOL 238 (H) 05/01/2020   Lab Results  Component Value Date   HDL 78 05/01/2020   Lab Results  Component Value Date   LDLCALC 149 (H) 05/01/2020   Lab Results  Component Value Date   TRIG 67 05/01/2020   Lab Results  Component Value Date   CHOLHDL 3.1 05/01/2020   Lab Results  Component Value Date   HGBA1C 5.4 05/01/2020       Assessment & Plan:   Problem List Items Addressed This Visit       Nervous and Auditory   Benign paroxysmal positional vertigo - Primary    Symptoms suggestive of BPPV Meclizine  PRN Vestibular exercises advised, material provided Avoid sudden positional changes Maintain adequate hydration      Relevant Medications   meclizine (ANTIVERT) 25 MG tablet   Other Visit Diagnoses     Encounter for examination following treatment at hospital   ER chart reviewed, including blood tests and EKG Dizziness improved with Meclizine      Need for immunization against influenza     Patient was educated on the recommendation for flu vaccine. After obtaining informed consent, the vaccine was administered no adverse effects noted at time of administration. Patient provided with education on arm soreness and use of tylenol or ibuprofen (if safe) for this. Encourage to use the arm vaccine was given in to help reduce the soreness. Patient educated on the signs of a reaction to the vaccine and advised to contact the office should these occur.    Relevant Orders   Flu Vaccine QUAD 64mo+IM (Fluarix, Fluzone & Alfiuria Quad PF) (Completed)        Meds ordered this encounter  Medications   meclizine (ANTIVERT) 25 MG tablet    Sig: Take 1 tablet (25 mg total) by mouth 3 (three) times daily as needed for dizziness.    Dispense:  30 tablet    Refill:  1  Rielly Corlett K Katie Moch, MD  

## 2020-12-18 NOTE — Assessment & Plan Note (Signed)
Symptoms suggestive of BPPV Meclizine PRN Vestibular exercises advised, material provided Avoid sudden positional changes Maintain adequate hydration

## 2021-01-03 ENCOUNTER — Other Ambulatory Visit: Payer: Self-pay | Admitting: Internal Medicine

## 2021-01-03 DIAGNOSIS — E890 Postprocedural hypothyroidism: Secondary | ICD-10-CM

## 2021-01-04 ENCOUNTER — Encounter: Payer: Self-pay | Admitting: Emergency Medicine

## 2021-01-04 ENCOUNTER — Ambulatory Visit
Admission: EM | Admit: 2021-01-04 | Discharge: 2021-01-04 | Disposition: A | Discharge status: Home / Self Care | Payer: Federal, State, Local not specified - PPO | Attending: Emergency Medicine | Admitting: Emergency Medicine

## 2021-01-04 DIAGNOSIS — M545 Low back pain, unspecified: Secondary | ICD-10-CM

## 2021-01-04 MED ORDER — TIZANIDINE HCL 2 MG PO CAPS: 2.0000 mg | ORAL_CAPSULE | Freq: Three times a day (TID) | ORAL | 0 refills | Status: DC

## 2021-01-04 MED ORDER — DEXAMETHASONE SODIUM PHOSPHATE 10 MG/ML IJ SOLN
10.0000 mg | Freq: Once | INTRAMUSCULAR | Status: AC
  Administered 2021-01-04: 10 mg via INTRAMUSCULAR

## 2021-01-04 NOTE — ED Triage Notes (Signed)
Pt presents today with c/o lower back pain x 3 weeks. Denies injury.

## 2021-01-04 NOTE — ED Provider Notes (Signed)
[facility]   [id number] 01/04/21 Arrival Time: 0840  JK:DTOIZ PAIN  SUBJECTIVE: History from: patient. Molly Knight is a 65 y.o. female complains of RT low back pain x 3 weeks.  Denies a precipitating event or specific injury.  Localizes the pain to the RT low back.  Describes the pain as intermittent and sharp in character.  Has tried OTC medications without relief.  Symptoms are made worse with movement.  Denies similar symptoms in the past.  Denies fever, chills, erythema, ecchymosis, effusion, weakness, numbness and tingling, saddle paresthesias, loss of bowel or bladder function.      ROS: As per HPI.  All other pertinent ROS negative.     Past Medical History:  Diagnosis Date   Depression    Thyroid disease    Thyroid nodule 09/27/2015   Last Assessment & Plan:  Formatting of this note might be different from the original. Stable.  Her left lobe is normal in size on physical exam with no palpable nodule.   Past Surgical History:  Procedure Laterality Date   ABDOMINAL HYSTERECTOMY  2003   bladder tach     CESAREAN SECTION     SHOULDER SURGERY Bilateral 02/2019   THYROIDECTOMY, PARTIAL  10/2016   No Known Allergies No current facility-administered medications on file prior to encounter.   Current Outpatient Medications on File Prior to Encounter  Medication Sig Dispense Refill   CALCIUM PO Take by mouth.     estradiol (VIVELLE-DOT) 0.05 MG/24HR patch Place 1 patch (0.05 mg total) onto the skin 2 (two) times a week. 24 patch 3   FLUoxetine (PROZAC) 20 MG capsule TAKE 1 CAPSULE(20 MG) BY MOUTH DAILY 30 capsule 3   levothyroxine (SYNTHROID) 75 MCG tablet TAKE 1 TABLET(75 MCG) BY MOUTH DAILY BEFORE AND BREAKFAST 90 tablet 0   MAGNESIUM PO Take by mouth.     meclizine (ANTIVERT) 25 MG tablet Take 1 tablet (25 mg total) by mouth 3 (three) times daily as needed for dizziness. 30 tablet 1   metroNIDAZOLE (METROGEL) 1 % gel SMARTSIG:1 Topical Every Night     Multiple  Vitamins-Minerals (MULTI VITAMIN/MINERALS) TABS Take 1 tablet by mouth daily.     omeprazole (PRILOSEC OTC) 20 MG tablet      VITAMIN D PO Take by mouth.     Social History   Socioeconomic History   Marital status: Married    Spouse name: Not on file   Number of children: Not on file   Years of education: Not on file   Highest education level: Not on file  Occupational History   Not on file  Tobacco Use   Smoking status: Never   Smokeless tobacco: Never  Vaping Use   Vaping Use: Never used  Substance and Sexual Activity   Alcohol use: Not Currently   Drug use: Never   Sexual activity: Not Currently    Birth control/protection: Surgical    Comment: hyst  Other Topics Concern   Not on file  Social History Narrative   Not on file   Social Determinants of Health   Financial Resource Strain: Low Risk    Difficulty of Paying Living Expenses: Not hard at all  Food Insecurity: No Food Insecurity   Worried About Programme researcher, broadcasting/film/video in the Last Year: Never true   Ran Out of Food in the Last Year: Never true  Transportation Needs: No Transportation Needs   Lack of Transportation (Medical): No   Lack of Transportation (Non-Medical): No  Physical Activity: Insufficiently Active   Days of Exercise per Week: 3 days   Minutes of Exercise per Session: 30 min  Stress: No Stress Concern Present   Feeling of Stress : Not at all  Social Connections: Moderately Integrated   Frequency of Communication with Friends and Family: Once a week   Frequency of Social Gatherings with Friends and Family: More than three times a week   Attends Religious Services: 1 to 4 times per year   Active Member of [employer] or Organizations: No   Attends Banker Meetings: Never   Marital Status: Married  Catering manager Violence: Not At Risk   Fear of Current or Ex-Partner: No   Emotionally Abused: No   Physically Abused: No   Sexually Abused: No   Family History  Problem Relation Age of  Onset   Aortic aneurysm Mother    Renal Disease Father    Colon cancer Sister    Throat cancer Brother    Heart attack Sister    Other Sister        tahycardia   Other Brother        heart valve surgery    OBJECTIVE:  Vitals:   01/04/21 0914  BP: (!) 145/78  Pulse: (!) 57  Resp: 18  Temp: 98.7 F (37.1 C)  TempSrc: Oral  SpO2: 97%    General appearance: ALERT; in no acute distress.  Head: NCAT Lungs: Normal respiratory effort Musculoskeletal: Back Inspection: Skin warm, dry, clear and intact without obvious erythema, effusion, or ecchymosis.  Palpation: TTP over LT lower paravertebral muscles ROM: FROM active and passive Strength: 5/5 hip flexion, 5/5 hip extension Skin: warm and dry Neurologic: Ambulates without difficulty; Sensation intact about the upper/ lower extremities Psychological: alert and cooperative; normal mood and affect  ASSESSMENT & PLAN:  1. Acute right-sided low back pain without sciatica    Meds ordered this encounter  Medications   tizanidine (ZANAFLEX) 2 MG capsule    Sig: Take 1 capsule (2 mg total) by mouth 3 (three) times daily.    Dispense:  30 capsule    Refill:  0    Order Specific Question:   Supervising Provider    Answer:   Molly Knight [[id number]]   dexamethasone (DECADRON) injection 10 mg   Continue conservative management of rest, ice, and gentle stretches Steroid shot given in office Take zanaflex at nighttime for symptomatic relief. Avoid driving or operating heavy machinery while using medication. Follow up with PCP if symptoms persist Return or go to the ER if you have any new or worsening symptoms (fever, chills, chest pain, abdominal pain, changes in bowel or bladder habits, pain radiating into lower legs, etc...)    Reviewed expectations re: course of current medical issues. Questions answered. Outlined signs and symptoms indicating need for more acute intervention. Patient verbalized understanding. After Visit  Summary given.     Molly Harding, PA-C 01/04/21 [phone]

## 2021-01-04 NOTE — Discharge Instructions (Signed)
Continue conservative management of rest, ice, and gentle stretches Steroid shot given in office Take zanaflex at nighttime for symptomatic relief. Avoid driving or operating heavy machinery while using medication. Follow up with PCP if symptoms persist Return or go to the ER if you have any new or worsening symptoms (fever, chills, chest pain, abdominal pain, changes in bowel or bladder habits, pain radiating into lower legs, etc...)  

## 2021-01-07 DIAGNOSIS — M722 Plantar fascial fibromatosis: Secondary | ICD-10-CM | POA: Diagnosis not present

## 2021-01-07 DIAGNOSIS — M79671 Pain in right foot: Secondary | ICD-10-CM | POA: Diagnosis not present

## 2021-01-23 ENCOUNTER — Ambulatory Visit (HOSPITAL_COMMUNITY)
Admission: RE | Admit: 2021-01-23 | Discharge: 2021-01-23 | Disposition: A | Discharge status: Home / Self Care | Payer: Federal, State, Local not specified - PPO | Source: Ambulatory Visit | Attending: Internal Medicine | Admitting: Internal Medicine

## 2021-01-23 ENCOUNTER — Encounter: Payer: Self-pay | Admitting: Internal Medicine

## 2021-01-23 ENCOUNTER — Other Ambulatory Visit: Payer: Self-pay

## 2021-01-23 ENCOUNTER — Ambulatory Visit: Payer: Federal, State, Local not specified - PPO | Admitting: Internal Medicine

## 2021-01-23 VITALS — BP 132/78 | HR 67 | Temp 98.1°F | Resp 18 | Ht 67.0 in | Wt 200.1 lb

## 2021-01-23 DIAGNOSIS — M545 Low back pain, unspecified: Secondary | ICD-10-CM

## 2021-01-23 MED ORDER — PREDNISONE 10 MG (21) PO TBPK: ORAL_TABLET | ORAL | 0 refills | Status: DC

## 2021-01-23 MED ORDER — TIZANIDINE HCL 4 MG PO CAPS: 4.0000 mg | ORAL_CAPSULE | Freq: Three times a day (TID) | ORAL | 0 refills | Status: DC

## 2021-01-23 NOTE — Patient Instructions (Signed)
Please take Prednisone and Tizanidine as prescribed.  Avoid heavy lifting or frequent bending.

## 2021-01-23 NOTE — Progress Notes (Signed)
Acute Office Visit  Subjective:    Patient ID: Molly Knight, female    DOB: April 29, 1955, 65 y.o.   MRN: [id number]  Chief Complaint  Patient presents with  . Back Pain    Pt has had back and hip pain for 1 month it is getting worse she had cortisone shot at urgent care this helped with muscle relaxer however it is hurting again     HPI Patient is in today for complaint of low back pain, which started about a month ago.  She denies any recent injury or heavy lifting.  She states that she has been learning to use a moving tractor, which could have attributed to her back pain.  Her pain is constant, worse with prolonged sitting and radiates to her right hip area.  She denies any numbness, tingling or weakness of the LE.  She went to urgent care, and was given steroid injection and Zanaflex p.o., which helped her for a few days.  Past Medical History:  Diagnosis Date  . Depression   . Thyroid disease   . Thyroid nodule 09/27/2015   Last Assessment & Plan:  Formatting of this note might be different from the original. Stable.  Her left lobe is normal in size on physical exam with no palpable nodule.    Past Surgical History:  Procedure Laterality Date  . ABDOMINAL HYSTERECTOMY  2003  . bladder tach    . CESAREAN SECTION    . SHOULDER SURGERY Bilateral 02/2019  . THYROIDECTOMY, PARTIAL  10/2016    Family History  Problem Relation Age of Onset  . Aortic aneurysm Mother   . Renal Disease Father   . Colon cancer Sister   . Throat cancer Brother   . Heart attack Sister   . Other Sister        tahycardia  . Other Brother        heart valve surgery    Social History   Socioeconomic History  . Marital status: Married    Spouse name: Not on file  . Number of children: Not on file  . Years of education: Not on file  . Highest education level: Not on file  Occupational History  . Not on file  Tobacco Use  . Smoking status: Never  . Smokeless tobacco: Never  Vaping Use   . Vaping Use: Never used  Substance and Sexual Activity  . Alcohol use: Not Currently  . Drug use: Never  . Sexual activity: Not Currently    Birth control/protection: Surgical    Comment: hyst  Other Topics Concern  . Not on file  Social History Narrative  . Not on file   Social Determinants of Health   Financial Resource Strain: Low Risk   . Difficulty of Paying Living Expenses: Not hard at all  Food Insecurity: No Food Insecurity  . Worried About Programme researcher, broadcasting/film/video in the Last Year: Never true  . Ran Out of Food in the Last Year: Never true  Transportation Needs: No Transportation Needs  . Lack of Transportation (Medical): No  . Lack of Transportation (Non-Medical): No  Physical Activity: Insufficiently Active  . Days of Exercise per Week: 3 days  . Minutes of Exercise per Session: 30 min  Stress: No Stress Concern Present  . Feeling of Stress : Not at all  Social Connections: Moderately Integrated  . Frequency of Communication with Friends and Family: Once a week  . Frequency of Social Gatherings with Friends and Family:  More than three times a week  . Attends Religious Services: 1 to 4 times per year  . Active Member of Clubs or Organizations: No  . Attends Banker Meetings: Never  . Marital Status: Married  Catering manager Violence: Not At Risk  . Fear of Current or Ex-Partner: No  . Emotionally Abused: No  . Physically Abused: No  . Sexually Abused: No    Outpatient Medications Prior to Visit  Medication Sig Dispense Refill  . CALCIUM PO Take by mouth.    . estradiol (VIVELLE-DOT) 0.05 MG/24HR patch Place 1 patch (0.05 mg total) onto the skin 2 (two) times a week. 24 patch 3  . FLUoxetine (PROZAC) 20 MG capsule TAKE 1 CAPSULE(20 MG) BY MOUTH DAILY 30 capsule 3  . levothyroxine (SYNTHROID) 75 MCG tablet TAKE 1 TABLET(75 MCG) BY MOUTH DAILY BEFORE AND BREAKFAST 90 tablet 0  . MAGNESIUM PO Take by mouth.    . meclizine (ANTIVERT) 25 MG tablet Take  1 tablet (25 mg total) by mouth 3 (three) times daily as needed for dizziness. 30 tablet 1  . metroNIDAZOLE (METROGEL) 1 % gel SMARTSIG:1 Topical Every Night    . Multiple Vitamins-Minerals (MULTI VITAMIN/MINERALS) TABS Take 1 tablet by mouth daily.    Marland Kitchen omeprazole (PRILOSEC OTC) 20 MG tablet     . VITAMIN D PO Take by mouth.    . tizanidine (ZANAFLEX) 2 MG capsule Take 1 capsule (2 mg total) by mouth 3 (three) times daily. 30 capsule 0   No facility-administered medications prior to visit.    No Known Allergies  Review of Systems  Constitutional:  Negative for chills and fever.  HENT:  Negative for congestion, sinus pressure, sinus pain and sore throat.   Eyes:  Negative for pain and discharge.  Respiratory:  Negative for cough and shortness of breath.   Cardiovascular:  Negative for chest pain and palpitations.  Gastrointestinal:  Negative for abdominal pain, diarrhea, nausea and vomiting.  Endocrine: Negative for polydipsia and polyuria.  Genitourinary:  Negative for dysuria and hematuria.  Musculoskeletal:  Positive for arthralgias and back pain. Negative for neck pain and neck stiffness.  Skin:  Negative for rash.  Neurological:  Negative for dizziness and weakness.  Psychiatric/Behavioral:  Negative for agitation and behavioral problems.       Objective:    Physical Exam Vitals reviewed.  Constitutional:      General: She is not in acute distress.    Appearance: She is not diaphoretic.  HENT:     Mouth/Throat:     Mouth: Mucous membranes are moist.     Pharynx: No posterior oropharyngeal erythema.  Eyes:     General: No scleral icterus.    Extraocular Movements: Extraocular movements intact.  Cardiovascular:     Rate and Rhythm: Normal rate and regular rhythm.     Pulses: Normal pulses.     Heart sounds: Normal heart sounds. No murmur heard. Pulmonary:     Breath sounds: Normal breath sounds. No wheezing or rales.  Musculoskeletal:        General: Tenderness  (Lumbar spine area) present.     Cervical back: Neck supple. No tenderness.     Right lower leg: No edema.     Left lower leg: No edema.  Skin:    General: Skin is warm.     Findings: No rash.  Neurological:     General: No focal deficit present.     Mental Status: She is alert and oriented  to person, place, and time.     Sensory: No sensory deficit.     Motor: No weakness.  Psychiatric:        Mood and Affect: Mood normal.        Behavior: Behavior normal.    BP 132/78 (BP Location: Left Arm, Patient Position: Sitting, Cuff Size: Normal)   Pulse 67   Temp 98.1 F (36.7 C) (Oral)   Resp 18   Ht 5\' 7"  (1.702 m)   Wt 200 lb 1.3 oz (90.8 kg)   LMP  (LMP Unknown)   SpO2 96%   BMI 31.34 kg/m  Wt Readings from Last 3 Encounters:  01/23/21 200 lb 1.3 oz (90.8 kg)  12/18/20 200 lb 0.6 oz (90.7 kg)  12/06/20 199 lb (90.3 kg)    Health Maintenance Due  Topic Date Due  . HIV Screening  Never done  . Hepatitis C Screening  Never done  . TETANUS/TDAP  Never done  . Zoster Vaccines- Shingrix (1 of 2) Never done  . COVID-19 Vaccine (4 - Booster for Moderna series) 08/03/2020    There are no preventive care reminders to display for this patient.   Lab Results  Component Value Date   TSH 1.570 09/13/2020   Lab Results  Component Value Date   WBC 9.4 12/15/2020   HGB 13.6 12/15/2020   HCT 40.6 12/15/2020   MCV 93.5 12/15/2020   PLT 284 12/15/2020   Lab Results  Component Value Date   NA 136 12/15/2020   K 3.8 12/15/2020   CO2 26 12/15/2020   GLUCOSE 118 (H) 12/15/2020   BUN 14 12/15/2020   CREATININE 0.79 12/15/2020   BILITOT 0.6 12/15/2020   ALKPHOS 68 12/15/2020   AST 22 12/15/2020   ALT 19 12/15/2020   PROT 6.9 12/15/2020   ALBUMIN 3.9 12/15/2020   CALCIUM 9.3 12/15/2020   ANIONGAP 8 12/15/2020   Lab Results  Component Value Date   CHOL 238 (H) 05/01/2020   Lab Results  Component Value Date   HDL 78 05/01/2020   Lab Results  Component Value Date    LDLCALC 149 (H) 05/01/2020   Lab Results  Component Value Date   TRIG 67 05/01/2020   Lab Results  Component Value Date   CHOLHDL 3.1 05/01/2020   Lab Results  Component Value Date   HGBA1C 5.4 05/01/2020       Assessment & Plan:   Problem List Items Addressed This Visit    Visit Diagnoses     Acute right-sided low back pain without sciatica    -  Primary Could be a muscle strain and/or DDD of lumbar spine Check x-ray of lumbar spine Avoid heavy lifting and frequent bending Sterapred for now, advised to avoid NSAIDs while taking prednisone Zanaflex as needed    Relevant Medications   predniSONE (STERAPRED UNI-PAK 21 TAB) 10 MG (21) TBPK tablet   tiZANidine (ZANAFLEX) 4 MG capsule   Other Relevant Orders   DG Lumbar Spine Complete        Meds ordered this encounter  Medications  . predniSONE (STERAPRED UNI-PAK 21 TAB) 10 MG (21) TBPK tablet    Sig: Take as package instructions.    Dispense:  1 each    Refill:  0  . tiZANidine (ZANAFLEX) 4 MG capsule    Sig: Take 1 capsule (4 mg total) by mouth 3 (three) times daily.    Dispense:  30 capsule    Refill:  0  Molly Piet K Jamera Vanloan, MD  

## 2021-01-24 DIAGNOSIS — X32XXXD Exposure to sunlight, subsequent encounter: Secondary | ICD-10-CM | POA: Diagnosis not present

## 2021-01-24 DIAGNOSIS — L570 Actinic keratosis: Secondary | ICD-10-CM | POA: Diagnosis not present

## 2021-01-24 DIAGNOSIS — B078 Other viral warts: Secondary | ICD-10-CM | POA: Diagnosis not present

## 2021-02-02 ENCOUNTER — Other Ambulatory Visit: Payer: Self-pay | Admitting: Internal Medicine

## 2021-02-02 DIAGNOSIS — F320 Major depressive disorder, single episode, mild: Secondary | ICD-10-CM

## 2021-03-07 DIAGNOSIS — B078 Other viral warts: Secondary | ICD-10-CM | POA: Diagnosis not present

## 2021-04-01 ENCOUNTER — Other Ambulatory Visit: Payer: Self-pay | Admitting: Internal Medicine

## 2021-04-01 DIAGNOSIS — E890 Postprocedural hypothyroidism: Secondary | ICD-10-CM

## 2021-05-23 DIAGNOSIS — D485 Neoplasm of uncertain behavior of skin: Secondary | ICD-10-CM | POA: Diagnosis not present

## 2021-05-23 DIAGNOSIS — Z1283 Encounter for screening for malignant neoplasm of skin: Secondary | ICD-10-CM | POA: Diagnosis not present

## 2021-05-23 DIAGNOSIS — X32XXXD Exposure to sunlight, subsequent encounter: Secondary | ICD-10-CM | POA: Diagnosis not present

## 2021-05-23 DIAGNOSIS — L570 Actinic keratosis: Secondary | ICD-10-CM | POA: Diagnosis not present

## 2021-05-23 DIAGNOSIS — D225 Melanocytic nevi of trunk: Secondary | ICD-10-CM | POA: Diagnosis not present

## 2021-06-02 ENCOUNTER — Other Ambulatory Visit: Payer: Self-pay | Admitting: Internal Medicine

## 2021-06-02 DIAGNOSIS — F320 Major depressive disorder, single episode, mild: Secondary | ICD-10-CM

## 2021-06-10 ENCOUNTER — Encounter: Payer: Self-pay | Admitting: Internal Medicine

## 2021-06-10 ENCOUNTER — Ambulatory Visit (INDEPENDENT_AMBULATORY_CARE_PROVIDER_SITE_OTHER): Payer: Federal, State, Local not specified - PPO | Admitting: Internal Medicine

## 2021-06-10 ENCOUNTER — Other Ambulatory Visit: Payer: Self-pay

## 2021-06-10 VITALS — BP 136/84 | HR 57 | Resp 18 | Ht 67.0 in | Wt 201.1 lb

## 2021-06-10 DIAGNOSIS — E559 Vitamin D deficiency, unspecified: Secondary | ICD-10-CM

## 2021-06-10 DIAGNOSIS — Z0001 Encounter for general adult medical examination with abnormal findings: Secondary | ICD-10-CM

## 2021-06-10 DIAGNOSIS — Z114 Encounter for screening for human immunodeficiency virus [HIV]: Secondary | ICD-10-CM | POA: Diagnosis not present

## 2021-06-10 DIAGNOSIS — R232 Flushing: Secondary | ICD-10-CM

## 2021-06-10 DIAGNOSIS — Z1159 Encounter for screening for other viral diseases: Secondary | ICD-10-CM

## 2021-06-10 DIAGNOSIS — E890 Postprocedural hypothyroidism: Secondary | ICD-10-CM

## 2021-06-10 MED ORDER — PROGESTERONE MICRONIZED 100 MG PO CAPS: 100.0000 mg | ORAL_CAPSULE | Freq: Every day | ORAL | 2 refills | Status: DC

## 2021-06-10 NOTE — Progress Notes (Signed)
Established Patient Office Visit  Subjective:  Patient ID: Molly Knight, female    DOB: Jun 08, 1955  Age: 66 y.o. MRN: [id number]  CC:  Chief Complaint  Patient presents with   Annual Exam    Annual exam pt hair is still falling out still having night sweats and has hot flashes     HPI Molly Knight is a 66 y.o. female with past medical history of postoperative hypothyroidism, hot flashes and depression who presents for annual physical.  She has been taking Prozac and uses estradiol patch for hot flashes.  She still complains of night sweats and hair fall.  She denies any recent change in weight or appetite, chronic cough or LAD.  BP is well-controlled. Patient denies headache, dizziness, chest pain, dyspnea or palpitations.    Past Medical History:  Diagnosis Date   Depression    Thyroid disease    Thyroid nodule 09/27/2015   Last Assessment & Plan:  Formatting of this note might be different from the original. Stable.  Her left lobe is normal in size on physical exam with no palpable nodule.    Past Surgical History:  Procedure Laterality Date   ABDOMINAL HYSTERECTOMY  2003   bladder tach     CESAREAN SECTION     SHOULDER SURGERY Bilateral 02/2019   THYROIDECTOMY, PARTIAL  10/2016    Family History  Problem Relation Age of Onset   Aortic aneurysm Mother    Renal Disease Father    Colon cancer Sister    Throat cancer Brother    Heart attack Sister    Other Sister        tahycardia   Other Brother        heart valve surgery    Social History   Socioeconomic History   Marital status: Married    Spouse name: Not on file   Number of children: Not on file   Years of education: Not on file   Highest education level: Not on file  Occupational History   Not on file  Tobacco Use   Smoking status: Never   Smokeless tobacco: Never  Vaping Use   Vaping Use: Never used  Substance and Sexual Activity   Alcohol use: Not Currently   Drug use: Never    Sexual activity: Not Currently    Birth control/protection: Surgical    Comment: hyst  Other Topics Concern   Not on file  Social History Narrative   Not on file   Social Determinants of Health   Financial Resource Strain: Low Risk    Difficulty of Paying Living Expenses: Not hard at all  Food Insecurity: No Food Insecurity   Worried About Charity fundraiser in the Last Year: Never true   [place] in the Last Year: Never true  Transportation Needs: No Transportation Needs   Lack of Transportation (Medical): No   Lack of Transportation (Non-Medical): No  Physical Activity: Insufficiently Active   Days of Exercise per Week: 3 days   Minutes of Exercise per Session: 30 min  Stress: No Stress Concern Present   Feeling of Stress : Not at all  Social Connections: Moderately Integrated   Frequency of Communication with Friends and Family: Once a week   Frequency of Social Gatherings with Friends and Family: More than three times a week   Attends Religious Services: 1 to 4 times per year   Active Member of [employer] or Organizations: No   Attends Archivist Meetings:  Never   Marital Status: Married  Human resources officer Violence: Not At Risk   Fear of Current or Ex-Partner: No   Emotionally Abused: No   Physically Abused: No   Sexually Abused: No    Outpatient Medications Prior to Visit  Medication Sig Dispense Refill   CALCIUM PO Take by mouth.     estradiol (VIVELLE-DOT) 0.05 MG/24HR patch Place 1 patch (0.05 mg total) onto the skin 2 (two) times a week. 24 patch 3   FLUoxetine (PROZAC) 20 MG capsule TAKE 1 CAPSULE(20 MG) BY MOUTH DAILY 30 capsule 3   levothyroxine (SYNTHROID) 75 MCG tablet TAKE 1 TABLET(75 MCG) BY MOUTH DAILY BEFORE BREAKFAST 90 tablet 0   MAGNESIUM PO Take by mouth.     meclizine (ANTIVERT) 25 MG tablet Take 1 tablet (25 mg total) by mouth 3 (three) times daily as needed for dizziness. 30 tablet 1   metroNIDAZOLE (METROGEL) 1 % gel SMARTSIG:1  Topical Every Night     Multiple Vitamins-Minerals (MULTI VITAMIN/MINERALS) TABS Take 1 tablet by mouth daily.     omeprazole (PRILOSEC OTC) 20 MG tablet      tiZANidine (ZANAFLEX) 4 MG capsule Take 1 capsule (4 mg total) by mouth 3 (three) times daily. 30 capsule 0   VITAMIN D PO Take by mouth.     predniSONE (STERAPRED UNI-PAK 21 TAB) 10 MG (21) TBPK tablet Take as package instructions. 1 each 0   No facility-administered medications prior to visit.    No Known Allergies  ROS Review of Systems  Constitutional:  Positive for diaphoresis. Negative for chills and fever.  HENT:  Negative for congestion, sinus pressure, sinus pain and sore throat.   Eyes:  Negative for pain and discharge.  Respiratory:  Negative for cough and shortness of breath.   Cardiovascular:  Negative for chest pain and palpitations.  Gastrointestinal:  Negative for abdominal pain, diarrhea, nausea and vomiting.  Endocrine: Positive for heat intolerance. Negative for polydipsia and polyuria.  Genitourinary:  Negative for dysuria and hematuria.  Musculoskeletal:  Positive for arthralgias and back pain. Negative for neck pain and neck stiffness.  Skin:  Negative for rash.  Neurological:  Negative for dizziness and weakness.  Psychiatric/Behavioral:  Negative for agitation and behavioral problems.      Objective:    Physical Exam Vitals reviewed.  Constitutional:      General: She is not in acute distress.    Appearance: She is not diaphoretic.  HENT:     Mouth/Throat:     Mouth: Mucous membranes are moist.     Pharynx: No posterior oropharyngeal erythema.  Eyes:     General: No scleral icterus.    Extraocular Movements: Extraocular movements intact.  Cardiovascular:     Rate and Rhythm: Normal rate and regular rhythm.     Pulses: Normal pulses.     Heart sounds: Normal heart sounds. No murmur heard. Pulmonary:     Breath sounds: Normal breath sounds. No wheezing or rales.  Abdominal:     Palpations:  Abdomen is soft.     Tenderness: There is no abdominal tenderness.  Musculoskeletal:        General: Tenderness (Lumbar spine area) present.     Cervical back: Neck supple. No tenderness.     Right lower leg: No edema.     Left lower leg: No edema.  Skin:    General: Skin is warm.     Findings: No rash.  Neurological:     General: No focal deficit  present.     Mental Status: She is alert and oriented to person, place, and time.     Cranial Nerves: No cranial nerve deficit.     Sensory: No sensory deficit.     Motor: No weakness.  Psychiatric:        Mood and Affect: Mood normal.        Behavior: Behavior normal.    BP 136/84 (BP Location: Right Arm, Patient Position: Sitting, Cuff Size: Normal)    Pulse (!) 57    Resp 18    Ht _0  (1.702 m)    Wt 201 lb 1.3 oz (91.2 kg)    LMP  (LMP Unknown)    SpO2 99%    BMI 31.49 kg/m  Wt Readings from Last 3 Encounters:  06/10/21 201 lb 1.3 oz (91.2 kg)  01/23/21 200 lb 1.3 oz (90.8 kg)  12/18/20 200 lb 0.6 oz (90.7 kg)    Lab Results  Component Value Date   TSH 2.110 06/10/2021   Lab Results  Component Value Date   WBC 5.0 06/10/2021   HGB 13.5 06/10/2021   HCT 39.8 06/10/2021   MCV 92 06/10/2021   PLT 272 06/10/2021   Lab Results  Component Value Date   NA 138 06/10/2021   K 4.6 06/10/2021   CO2 26 06/10/2021   GLUCOSE 93 06/10/2021   BUN 12 06/10/2021   CREATININE 0.73 06/10/2021   BILITOT 0.3 06/10/2021   ALKPHOS 80 06/10/2021   AST 19 06/10/2021   ALT 16 06/10/2021   PROT 6.6 06/10/2021   ALBUMIN 4.4 06/10/2021   CALCIUM 9.1 06/10/2021   ANIONGAP 8 12/15/2020   EGFR 92 06/10/2021   Lab Results  Component Value Date   CHOL 240 (H) 06/10/2021   Lab Results  Component Value Date   HDL 79 06/10/2021   Lab Results  Component Value Date   LDLCALC 148 (H) 06/10/2021   Lab Results  Component Value Date   TRIG 79 06/10/2021   Lab Results  Component Value Date   CHOLHDL 3.0 06/10/2021   Lab Results   Component Value Date   HGBA1C 5.7 (H) 06/10/2021      Assessment & Plan:   Problem List Items Addressed This Visit       Encounter for general adult medical examination with abnormal findings - Primary   Physical exam as documented. Fasting blood tests today.      Relevant Orders  Lipid panel (Completed)  Hemoglobin A1c (Completed)  CMP14+EGFR (Completed)  CBC with Differential/Platelet (Completed)    Cardiovascular and Mediastinum   Hot flashes    Uncontrolled On Estradiol patch and Prozac Added progesterone for hot flashes and night sweats      Relevant Medications   progesterone (PROMETRIUM) 100 MG capsule     Endocrine   Postoperative hypothyroidism    Due to h/o multinodular goiter Lab Results  Component Value Date   TSH 2.110 06/10/2021   Takes Levothyroxine 75 mcg QD      Relevant Orders   TSH + free T4 (Completed)     Other   Other Visit Diagnoses     Vitamin D deficiency       Relevant Orders   VITAMIN D 25 Hydroxy (Vit-D Deficiency, Fractures) (Completed)   Encounter for screening for HIV       Relevant Orders   HIV antibody (with reflex) (Completed)   Need for hepatitis C screening test       Relevant Orders  Hepatitis C Antibody (Completed)       Meds ordered this encounter  Medications   progesterone (PROMETRIUM) 100 MG capsule    Sig: Take 1 capsule (100 mg total) by mouth daily.    Dispense:  30 capsule    Refill:  2    Follow-up: Return in about 6 months (around 12/08/2021) for Hypothyroidism and hot flashes.    Lindell Spar, MD

## 2021-06-10 NOTE — Patient Instructions (Signed)
Please start taking Progesterone as prescribed.  Please continue taking other medications as prescribed.

## 2021-06-11 LAB — CMP14+EGFR
ALT: 16 IU/L (ref 0–32)
AST: 19 IU/L (ref 0–40)
Albumin/Globulin Ratio: 2 (ref 1.2–2.2)
Albumin: 4.4 g/dL (ref 3.8–4.8)
Alkaline Phosphatase: 80 IU/L (ref 44–121)
BUN/Creatinine Ratio: 16 (ref 12–28)
BUN: 12 mg/dL (ref 8–27)
Bilirubin Total: 0.3 mg/dL (ref 0.0–1.2)
CO2: 26 mmol/L (ref 20–29)
Calcium: 9.1 mg/dL (ref 8.7–10.3)
Chloride: 101 mmol/L (ref 96–106)
Creatinine, Ser: 0.73 mg/dL (ref 0.57–1.00)
Globulin, Total: 2.2 g/dL (ref 1.5–4.5)
Glucose: 93 mg/dL (ref 70–99)
Potassium: 4.6 mmol/L (ref 3.5–5.2)
Sodium: 138 mmol/L (ref 134–144)
Total Protein: 6.6 g/dL (ref 6.0–8.5)
eGFR: 92 mL/min/{1.73_m2} (ref >59)

## 2021-06-11 LAB — CBC WITH DIFFERENTIAL/PLATELET
Basophils Absolute: 0.1 10*3/uL (ref 0.0–0.2)
Basos: 1 %
EOS (ABSOLUTE): 0.1 10*3/uL (ref 0.0–0.4)
Eos: 2 %
Hematocrit: 39.8 % (ref 34.0–46.6)
Hemoglobin: 13.5 g/dL (ref 11.1–15.9)
Immature Grans (Abs): 0 10*3/uL (ref 0.0–0.1)
Immature Granulocytes: 0 %
Lymphocytes Absolute: 2.1 10*3/uL (ref 0.7–3.1)
Lymphs: 43 %
MCH: 31.3 pg (ref 26.6–33.0)
MCHC: 33.9 g/dL (ref 31.5–35.7)
MCV: 92 fL (ref 79–97)
Monocytes Absolute: 0.4 10*3/uL (ref 0.1–0.9)
Monocytes: 7 %
Neutrophils Absolute: 2.3 10*3/uL (ref 1.4–7.0)
Neutrophils: 47 %
Platelets: 272 10*3/uL (ref 150–450)
RBC: 4.32 x10E6/uL (ref 3.77–5.28)
RDW: 11.8 % (ref 11.7–15.4)
WBC: 5 10*3/uL (ref 3.4–10.8)

## 2021-06-11 LAB — HIV ANTIBODY (ROUTINE TESTING W REFLEX): HIV Screen 4th Generation wRfx: NONREACTIVE

## 2021-06-11 LAB — HEMOGLOBIN A1C
Est. average glucose Bld gHb Est-mCnc: 117 mg/dL
Hgb A1c MFr Bld: 5.7 % — ABNORMAL HIGH (ref 4.8–5.6)

## 2021-06-11 LAB — LIPID PANEL
Chol/HDL Ratio: 3 ratio (ref 0.0–4.4)
Cholesterol, Total: 240 mg/dL — ABNORMAL HIGH (ref 100–199)
HDL: 79 mg/dL (ref >39)
LDL Chol Calc (NIH): 148 mg/dL — ABNORMAL HIGH (ref 0–99)
Triglycerides: 79 mg/dL (ref 0–149)
VLDL Cholesterol Cal: 13 mg/dL (ref 5–40)

## 2021-06-11 LAB — HEPATITIS C ANTIBODY: Hep C Virus Ab: NONREACTIVE

## 2021-06-11 LAB — TSH+FREE T4
Free T4: 1.08 ng/dL (ref 0.82–1.77)
TSH: 2.11 u[IU]/mL (ref 0.450–4.500)

## 2021-06-11 LAB — VITAMIN D 25 HYDROXY (VIT D DEFICIENCY, FRACTURES): Vit D, 25-Hydroxy: 38.7 ng/mL (ref 30.0–100.0)

## 2021-06-14 DIAGNOSIS — Z0001 Encounter for general adult medical examination with abnormal findings: Secondary | ICD-10-CM | POA: Insufficient documentation

## 2021-06-14 NOTE — Assessment & Plan Note (Signed)
Physical exam as documented. Fasting blood tests today. 

## 2021-06-14 NOTE — Assessment & Plan Note (Addendum)
Uncontrolled On Estradiol patch and Prozac Added progesterone for hot flashes and night sweats

## 2021-06-14 NOTE — Assessment & Plan Note (Signed)
Due to h/o multinodular goiter Lab Results  Component Value Date   TSH 2.110 06/10/2021   Takes Levothyroxine 75 mcg QD 

## 2021-06-17 ENCOUNTER — Other Ambulatory Visit: Payer: Self-pay | Admitting: Internal Medicine

## 2021-06-17 DIAGNOSIS — E890 Postprocedural hypothyroidism: Secondary | ICD-10-CM

## 2021-07-29 ENCOUNTER — Other Ambulatory Visit: Payer: Self-pay | Admitting: Obstetrics & Gynecology

## 2021-08-06 ENCOUNTER — Other Ambulatory Visit (HOSPITAL_COMMUNITY): Payer: Self-pay | Admitting: Internal Medicine

## 2021-08-06 ENCOUNTER — Encounter: Payer: Self-pay | Admitting: Internal Medicine

## 2021-08-06 ENCOUNTER — Ambulatory Visit: Payer: Federal, State, Local not specified - PPO | Admitting: Internal Medicine

## 2021-08-06 DIAGNOSIS — H65191 Other acute nonsuppurative otitis media, right ear: Secondary | ICD-10-CM | POA: Diagnosis not present

## 2021-08-06 DIAGNOSIS — M26621 Arthralgia of right temporomandibular joint: Secondary | ICD-10-CM | POA: Diagnosis not present

## 2021-08-06 DIAGNOSIS — Z1231 Encounter for screening mammogram for malignant neoplasm of breast: Secondary | ICD-10-CM

## 2021-08-06 MED ORDER — IBUPROFEN 600 MG PO TABS: 600.0000 mg | ORAL_TABLET | Freq: Three times a day (TID) | ORAL | 0 refills | Status: DC | PRN

## 2021-08-06 MED ORDER — AMOXICILLIN-POT CLAVULANATE 875-125 MG PO TABS: 1.0000 | ORAL_TABLET | Freq: Two times a day (BID) | ORAL | 0 refills | Status: DC

## 2021-08-06 NOTE — Progress Notes (Signed)
?  ? ?Virtual Visit via Telephone Note  ? ?This visit type was conducted due to national recommendations for restrictions regarding the COVID-19 Pandemic (e.g. social distancing) in an effort to limit this patient's exposure and mitigate transmission in our community.  Due to her co-morbid illnesses, this patient is at least at moderate risk for complications without adequate follow up.  This format is felt to be most appropriate for this patient at this time.  The patient did not have access to video technology/had technical difficulties with video requiring transitioning to audio format only (telephone).  All issues noted in this document were discussed and addressed.  No physical exam could be performed with this format. ? ?Evaluation Performed:  Follow-up visit ? ?Date:  08/06/2021  ? ?ID:  Molly Knight, DOB [birth year], MRN [id number] ? ?Patient Location: Home ?Provider Location: Office/Clinic ? ?Participants: Patient ?Location of Patient: Home ?Location of Provider: Telehealth ?Consent was obtain for visit to be over via telehealth. ?I verified that I am speaking with the correct person using two identifiers. ? ?PCP:  Anabel Halon, MD  ? ?Chief Complaint: Right jaw pain ? ?History of Present Illness:   ? ?Molly Knight is a 66 y.o. female who has a televisit for complaint of right sided jaw pain for the last 2 weeks, along with headache.  She currently denies any ear discharge.  She has had a visit with her dental surgeon, but did not find any abnormality on the right side.  She recently had a crown placed on the left side.  She has teeth grinding at nighttime, for which she has been using her nightguard.  She currently denies any fever, chills or sinus pain. ? ?The patient does not have symptoms concerning for COVID-19 infection (fever, chills, cough, or new shortness of breath).  ? ?Past Medical, Surgical, Social History, Allergies, and Medications have been Reviewed. ? ?Past Medical History:   ?Diagnosis Date  ? Depression   ? Thyroid disease   ? Thyroid nodule 09/27/2015  ? Last Assessment & Plan:  Formatting of this note might be different from the original. Stable.  Her left lobe is normal in size on physical exam with no palpable nodule.  ? ?Past Surgical History:  ?Procedure Laterality Date  ? ABDOMINAL HYSTERECTOMY  2003  ? bladder tach    ? CESAREAN SECTION    ? SHOULDER SURGERY Bilateral 02/2019  ? THYROIDECTOMY, PARTIAL  10/2016  ?  ? ?Current Meds  ?Medication Sig  ? CALCIUM PO Take by mouth.  ? estradiol (VIVELLE-DOT) 0.05 MG/24HR patch APPLY 1 PATCH(0.05 MG) TOPICALLY TO THE SKIN 2 TIMES A WEEK  ? FLUoxetine (PROZAC) 20 MG capsule TAKE 1 CAPSULE(20 MG) BY MOUTH DAILY  ? levothyroxine (SYNTHROID) 75 MCG tablet TAKE 1 TABLET(75 MCG) BY MOUTH DAILY BEFORE BREAKFAST  ? MAGNESIUM PO Take by mouth.  ? meclizine (ANTIVERT) 25 MG tablet Take 1 tablet (25 mg total) by mouth 3 (three) times daily as needed for dizziness.  ? metroNIDAZOLE (METROGEL) 1 % gel SMARTSIG:1 Topical Every Night  ? Multiple Vitamins-Minerals (MULTI VITAMIN/MINERALS) TABS Take 1 tablet by mouth daily.  ? omeprazole (PRILOSEC OTC) 20 MG tablet   ? progesterone (PROMETRIUM) 100 MG capsule Take 1 capsule (100 mg total) by mouth daily.  ? tiZANidine (ZANAFLEX) 4 MG capsule Take 1 capsule (4 mg total) by mouth 3 (three) times daily.  ? VITAMIN D PO Take by mouth.  ?  ? ?Allergies:   Patient has no known allergies.  ? ?  ROS:   ?Please see the history of present illness.    ? ?All other systems reviewed and are negative. ? ? ?Labs/Other Tests and Data Reviewed:   ? ?Recent Labs: ?06/10/2021: ALT 16; BUN 12; Creatinine, Ser 0.73; Hemoglobin 13.5; Platelets 272; Potassium 4.6; Sodium 138; TSH 2.110  ? ?Recent Lipid Panel ?Lab Results  ?Component Value Date/Time  ? CHOL 240 (H) 06/10/2021 11:07 AM  ? TRIG 79 06/10/2021 11:07 AM  ? HDL 79 06/10/2021 11:07 AM  ? CHOLHDL 3.0 06/10/2021 11:07 AM  ? LDLCALC 148 (H) 06/10/2021 11:07 AM  ? ? ?Wt  Readings from Last 3 Encounters:  ?06/10/21 201 lb 1.3 oz (91.2 kg)  ?01/23/21 200 lb 1.3 oz (90.8 kg)  ?12/18/20 200 lb 0.6 oz (90.7 kg)  ?  ? ?ASSESSMENT & PLAN:   ? ?TM joint pain ?Could be a referred pain from otitis media ?Will empirically treat with Augmentin ?Ibuprofen as needed for TM joint pain ?Advised to discuss with dental surgeon if persistent jaw pain ? ?Time:   ?Today, I have spent 12 minutes reviewing the chart, including problem list, medications, and with the patient with telehealth technology discussing the above problems. ? ? ?Medication Adjustments/Labs and Tests Ordered: ?Current medicines are reviewed at length with the patient today.  Concerns regarding medicines are outlined above.  ? ?Tests Ordered: ?No orders of the defined types were placed in this encounter. ? ? ?Medication Changes: ?No orders of the defined types were placed in this encounter. ? ? ? ?Note: This dictation was prepared with Dragon dictation along with smaller phrase technology. Similar sounding words can be transcribed inadequately or may not be corrected upon review. Any transcriptional errors that result from this process are unintentional.  ?  ? ? ?Disposition:  Follow up  ?Signed, ?Anabel Halon, MD  ?08/06/2021 8:52 AM    ? ?[facility] ?[facility] ?

## 2021-08-14 DIAGNOSIS — K080 Exfoliation of teeth due to systemic causes: Secondary | ICD-10-CM | POA: Diagnosis not present

## 2021-08-19 ENCOUNTER — Encounter (HOSPITAL_COMMUNITY): Payer: Federal, State, Local not specified - PPO

## 2021-08-19 DIAGNOSIS — Z1231 Encounter for screening mammogram for malignant neoplasm of breast: Secondary | ICD-10-CM

## 2021-08-20 DIAGNOSIS — K080 Exfoliation of teeth due to systemic causes: Secondary | ICD-10-CM | POA: Diagnosis not present

## 2021-08-30 ENCOUNTER — Other Ambulatory Visit: Payer: Self-pay | Admitting: Internal Medicine

## 2021-08-30 ENCOUNTER — Telehealth: Payer: Self-pay | Admitting: *Deleted

## 2021-08-30 DIAGNOSIS — H8110 Benign paroxysmal vertigo, unspecified ear: Secondary | ICD-10-CM

## 2021-08-30 MED ORDER — MECLIZINE HCL 25 MG PO TABS: 25.0000 mg | ORAL_TABLET | Freq: Three times a day (TID) | ORAL | 1 refills | Status: DC | PRN

## 2021-08-30 NOTE — Telephone Encounter (Signed)
Pt is going on cruise would like some medication for sea sick advised that Allena Katz was out of office today and would be Monday before this was taken care of please advise  ?

## 2021-09-02 NOTE — Telephone Encounter (Signed)
Pt advised with verbal understanding  °

## 2021-09-11 ENCOUNTER — Other Ambulatory Visit: Payer: Self-pay | Admitting: Internal Medicine

## 2021-09-11 DIAGNOSIS — R232 Flushing: Secondary | ICD-10-CM

## 2021-09-13 ENCOUNTER — Ambulatory Visit (HOSPITAL_COMMUNITY)
Admission: RE | Admit: 2021-09-13 | Discharge: 2021-09-13 | Disposition: A | Discharge status: Home / Self Care | Payer: Federal, State, Local not specified - PPO | Source: Ambulatory Visit | Attending: Internal Medicine | Admitting: Internal Medicine

## 2021-09-13 DIAGNOSIS — Z1231 Encounter for screening mammogram for malignant neoplasm of breast: Secondary | ICD-10-CM | POA: Insufficient documentation

## 2021-09-20 ENCOUNTER — Encounter: Payer: Self-pay | Admitting: Internal Medicine

## 2021-09-20 ENCOUNTER — Ambulatory Visit: Payer: Federal, State, Local not specified - PPO | Admitting: Internal Medicine

## 2021-09-20 VITALS — BP 128/84 | HR 69 | Resp 18 | Ht 67.0 in | Wt 197.2 lb

## 2021-09-20 DIAGNOSIS — R052 Subacute cough: Secondary | ICD-10-CM | POA: Diagnosis not present

## 2021-09-20 DIAGNOSIS — G9331 Postviral fatigue syndrome: Secondary | ICD-10-CM | POA: Diagnosis not present

## 2021-09-20 MED ORDER — METHYLPREDNISOLONE 4 MG PO TBPK: ORAL_TABLET | ORAL | 0 refills | Status: DC

## 2021-09-20 MED ORDER — BENZONATATE 100 MG PO CAPS
100.0000 mg | ORAL_CAPSULE | Freq: Two times a day (BID) | ORAL | 0 refills | Status: DC | PRN
Start: 2021-09-20 — End: 2021-11-06

## 2021-09-20 NOTE — Progress Notes (Signed)
Acute Office Visit  Subjective:    Patient ID: Molly Knight, female    DOB: 1956/03/12, 66 y.o.   MRN: [id number]  Chief Complaint  Patient presents with   Cough    Pt has had cough since 09-05-21 has tried otc cough suppressants these are not working no runny nose or fever just dry cough     HPI Patient is in today for complaint of dry cough for the last 2 weeks.  She denies any nasal congestion, postnasal drip, sore throat, dyspnea or wheezing currently.  She went on a cruise to [place] recently, and started having cough while she was on a cruise.  She denied any fever or chills at that time.  Of note, she also has history of GERD and takes omeprazole for it.  Past Medical History:  Diagnosis Date   Depression    Thyroid disease    Thyroid nodule 09/27/2015   Last Assessment & Plan:  Formatting of this note might be different from the original. Stable.  Her left lobe is normal in size on physical exam with no palpable nodule.    Past Surgical History:  Procedure Laterality Date   ABDOMINAL HYSTERECTOMY  2003   bladder tach     CESAREAN SECTION     SHOULDER SURGERY Bilateral 02/2019   THYROIDECTOMY, PARTIAL  10/2016    Family History  Problem Relation Age of Onset   Aortic aneurysm Mother    Renal Disease Father    Colon cancer Sister    Throat cancer Brother    Heart attack Sister    Other Sister        tahycardia   Other Brother        heart valve surgery    Social History   Socioeconomic History   Marital status: Married    Spouse name: Not on file   Number of children: Not on file   Years of education: Not on file   Highest education level: Not on file  Occupational History   Not on file  Tobacco Use   Smoking status: Never   Smokeless tobacco: Never  Vaping Use   Vaping Use: Never used  Substance and Sexual Activity   Alcohol use: Not Currently   Drug use: Never   Sexual activity: Not Currently    Birth control/protection: Surgical     Comment: hyst  Other Topics Concern   Not on file  Social History Narrative   Not on file   Social Determinants of Health   Financial Resource Strain: Not on file  Food Insecurity: Not on file  Transportation Needs: Not on file  Physical Activity: Not on file  Stress: Not on file  Social Connections: Not on file  Intimate Partner Violence: Not on file    Outpatient Medications Prior to Visit  Medication Sig Dispense Refill   Ascorbic Acid (VITAMIN C) 100 MG tablet Take 100 mg by mouth daily.     CALCIUM PO Take by mouth.     estradiol (VIVELLE-DOT) 0.05 MG/24HR patch APPLY 1 PATCH(0.05 MG) TOPICALLY TO THE SKIN 2 TIMES A WEEK 24 patch 3   FLUoxetine (PROZAC) 20 MG capsule TAKE 1 CAPSULE(20 MG) BY MOUTH DAILY 30 capsule 3   ibuprofen (ADVIL) 600 MG tablet Take 1 tablet (600 mg total) by mouth every 8 (eight) hours as needed. 30 tablet 0   levothyroxine (SYNTHROID) 75 MCG tablet TAKE 1 TABLET(75 MCG) BY MOUTH DAILY BEFORE BREAKFAST 90 tablet 1   MAGNESIUM  PO Take by mouth.     meclizine (ANTIVERT) 25 MG tablet Take 1 tablet (25 mg total) by mouth 3 (three) times daily as needed for dizziness. 30 tablet 1   metroNIDAZOLE (METROGEL) 1 % gel SMARTSIG:1 Topical Every Night     Multiple Vitamins-Minerals (MULTI VITAMIN/MINERALS) TABS Take 1 tablet by mouth daily.     omeprazole (PRILOSEC OTC) 20 MG tablet      progesterone (PROMETRIUM) 100 MG capsule TAKE 1 CAPSULE(100 MG) BY MOUTH DAILY 30 capsule 2   tiZANidine (ZANAFLEX) 4 MG capsule Take 1 capsule (4 mg total) by mouth 3 (three) times daily. 30 capsule 0   VITAMIN D PO Take by mouth.     amoxicillin-clavulanate (AUGMENTIN) 875-125 MG tablet Take 1 tablet by mouth 2 (two) times daily. 14 tablet 0   No facility-administered medications prior to visit.    No Known Allergies  Review of Systems  Constitutional:  Positive for diaphoresis. Negative for chills and fever.  HENT:  Negative for congestion, sinus pressure, sinus pain and  sore throat.   Eyes:  Negative for pain and discharge.  Respiratory:  Positive for cough. Negative for shortness of breath.   Cardiovascular:  Negative for chest pain and palpitations.  Gastrointestinal:  Negative for abdominal pain, diarrhea, nausea and vomiting.  Endocrine: Negative for polydipsia and polyuria.  Genitourinary:  Negative for dysuria and hematuria.  Musculoskeletal:  Positive for arthralgias and back pain. Negative for neck pain and neck stiffness.  Skin:  Negative for rash.  Neurological:  Negative for dizziness and weakness.  Psychiatric/Behavioral:  Negative for agitation and behavioral problems.       Objective:    Physical Exam Constitutional:      General: She is not in acute distress.    Appearance: She is not diaphoretic.  HENT:     Head: Normocephalic and atraumatic.     Nose: No congestion.     Mouth/Throat:     Mouth: Mucous membranes are moist.     Pharynx: No posterior oropharyngeal erythema.  Eyes:     Extraocular Movements: Extraocular movements intact.  Cardiovascular:     Rate and Rhythm: Normal rate and regular rhythm.     Pulses: Normal pulses.     Heart sounds: Normal heart sounds. No murmur heard. Pulmonary:     Breath sounds: Normal breath sounds. No wheezing or rales.  Skin:    General: Skin is warm.     Findings: No rash.  Neurological:     General: No focal deficit present.     Mental Status: She is alert and oriented to person, place, and time.  Psychiatric:        Mood and Affect: Mood normal.        Behavior: Behavior normal.    BP 128/84 (BP Location: Right Arm, Patient Position: Sitting, Cuff Size: Normal)   Pulse 69   Resp 18   Ht 5\' 7"  (1.702 m)   Wt 197 lb 3.2 oz (89.4 kg)   LMP  (LMP Unknown)   SpO2 97%   BMI 30.89 kg/m  Wt Readings from Last 3 Encounters:  09/20/21 197 lb 3.2 oz (89.4 kg)  06/10/21 201 lb 1.3 oz (91.2 kg)  01/23/21 200 lb 1.3 oz (90.8 kg)        Assessment & Plan:   Problem List Items  Addressed This Visit    Visit Diagnoses     Subacute cough    -  Primary   Relevant Medications  benzonatate (TESSALON) 100 MG capsule   methylPREDNISolone (MEDROL DOSEPAK) 4 MG TBPK tablet   Postviral syndrome      Subacute cough likely due to postviral syndrome She might have viral URTI while on cruise, now has dry cough Has tried OTC antitussive with mild relief Medrol Dosepak Tessalon as needed for cough Advised to use humidifier to avoid dry air        Meds ordered this encounter  Medications   benzonatate (TESSALON) 100 MG capsule    Sig: Take 1 capsule (100 mg total) by mouth 2 (two) times daily as needed for cough.    Dispense:  20 capsule    Refill:  0   methylPREDNISolone (MEDROL DOSEPAK) 4 MG TBPK tablet    Sig: Take as package instructions.    Dispense:  1 each    Refill:  0     Lashanti Chambless Keith Rake, MD

## 2021-09-20 NOTE — Patient Instructions (Signed)
Please take Prednisone as prescribed.  Please take Tessalon as needed for cough. Okay to take Mucinex or Robitussin as needed for cough.

## 2021-10-10 ENCOUNTER — Other Ambulatory Visit: Payer: Self-pay | Admitting: Internal Medicine

## 2021-10-10 DIAGNOSIS — F320 Major depressive disorder, single episode, mild: Secondary | ICD-10-CM

## 2021-11-06 ENCOUNTER — Ambulatory Visit: Payer: Federal, State, Local not specified - PPO | Admitting: Internal Medicine

## 2021-11-06 ENCOUNTER — Encounter: Payer: Self-pay | Admitting: Internal Medicine

## 2021-11-06 VITALS — BP 138/74 | HR 61 | Ht 67.0 in | Wt 199.2 lb

## 2021-11-06 DIAGNOSIS — M25552 Pain in left hip: Secondary | ICD-10-CM | POA: Diagnosis not present

## 2021-11-06 DIAGNOSIS — M5432 Sciatica, left side: Secondary | ICD-10-CM | POA: Insufficient documentation

## 2021-11-06 MED ORDER — NAPROXEN 500 MG PO TABS: 500.0000 mg | ORAL_TABLET | Freq: Two times a day (BID) | ORAL | 1 refills | Status: DC

## 2021-11-06 NOTE — Progress Notes (Signed)
Acute Office Visit  Subjective:    Patient ID: Molly Knight, female    DOB: [birth year], 66 y.o.   MRN: [id number]  Chief Complaint  Patient presents with   Hip Pain    Pt c/o hip pain left side radiating down her leg, began 1 week ago, pain is unbearable.     HPI Patient is in today for c/o left hip pain for the last 1 week, which is constant, progressive, worse with movement and lying on left side.  Her pain is radiating to the left thigh and knee area.  Denies any recent fall or injury.  She had lifted her 18-year old grandson, after which her hip pain started.  She has chronic hip pain, which gets worse with exertional activity.  She denies any numbness or tingling of the LE.  Past Medical History:  Diagnosis Date   Depression    Thyroid disease    Thyroid nodule 09/27/2015   Last Assessment & Plan:  Formatting of this note might be different from the original. Stable.  Her left lobe is normal in size on physical exam with no palpable nodule.    Past Surgical History:  Procedure Laterality Date   ABDOMINAL HYSTERECTOMY  2003   bladder tach     CESAREAN SECTION     SHOULDER SURGERY Bilateral 02/2019   THYROIDECTOMY, PARTIAL  10/2016    Family History  Problem Relation Age of Onset   Aortic aneurysm Mother    Renal Disease Father    Colon cancer Sister    Throat cancer Brother    Heart attack Sister    Other Sister        tahycardia   Other Brother        heart valve surgery    Social History   Socioeconomic History   Marital status: Married    Spouse name: Not on file   Number of children: Not on file   Years of education: Not on file   Highest education level: Not on file  Occupational History   Not on file  Tobacco Use   Smoking status: Never   Smokeless tobacco: Never  Vaping Use   Vaping Use: Never used  Substance and Sexual Activity   Alcohol use: Not Currently   Drug use: Never   Sexual activity: Not Currently    Birth control/protection:  Surgical    Comment: hyst  Other Topics Concern   Not on file  Social History Narrative   Not on file   Social Determinants of Health   Financial Resource Strain: Low Risk  (08/27/2020)   Overall Financial Resource Strain (CARDIA)    Difficulty of Paying Living Expenses: Not hard at all  Food Insecurity: No Food Insecurity (08/27/2020)   Hunger Vital Sign    Worried About Running Out of Food in the Last Year: Never true    Ran Out of Food in the Last Year: Never true  Transportation Needs: No Transportation Needs (08/27/2020)   PRAPARE - Administrator, Civil Service (Medical): No    Lack of Transportation (Non-Medical): No  Physical Activity: Insufficiently Active (08/27/2020)   Exercise Vital Sign    Days of Exercise per Week: 3 days    Minutes of Exercise per Session: 30 min  Stress: No Stress Concern Present (08/27/2020)   [employer] of Occupational Health - Occupational Stress Questionnaire    Feeling of Stress : Not at all  Social Connections: Moderately Integrated (08/27/2020)  Social Advertising account executive [NHANES]    Frequency of Communication with Friends and Family: Once a week    Frequency of Social Gatherings with Friends and Family: More than three times a week    Attends Religious Services: 1 to 4 times per year    Active Member of [employer] or Organizations: No    Attends Banker Meetings: Never    Marital Status: Married  Catering manager Violence: Not At Risk (08/27/2020)   Humiliation, Afraid, Rape, and Kick questionnaire    Fear of Current or Ex-Partner: No    Emotionally Abused: No    Physically Abused: No    Sexually Abused: No    Outpatient Medications Prior to Visit  Medication Sig Dispense Refill   CALCIUM PO Take by mouth.     cetirizine (ZYRTEC) 10 MG tablet Take 10 mg by mouth daily.     estradiol (VIVELLE-DOT) 0.05 MG/24HR patch APPLY 1 PATCH(0.05 MG) TOPICALLY TO THE SKIN 2 TIMES A WEEK 24 patch 3   FLUoxetine  (PROZAC) 20 MG capsule TAKE 1 CAPSULE(20 MG) BY MOUTH DAILY 30 capsule 3   levothyroxine (SYNTHROID) 75 MCG tablet TAKE 1 TABLET(75 MCG) BY MOUTH DAILY BEFORE BREAKFAST 90 tablet 1   MAGNESIUM PO Take by mouth.     meclizine (ANTIVERT) 25 MG tablet Take 1 tablet (25 mg total) by mouth 3 (three) times daily as needed for dizziness. 30 tablet 1   metroNIDAZOLE (METROGEL) 1 % gel SMARTSIG:1 Topical Every Night     Multiple Vitamins-Minerals (MULTI VITAMIN/MINERALS) TABS Take 1 tablet by mouth daily.     progesterone (PROMETRIUM) 100 MG capsule TAKE 1 CAPSULE(100 MG) BY MOUTH DAILY 30 capsule 2   VITAMIN D PO Take by mouth.     Ascorbic Acid (VITAMIN C) 100 MG tablet Take 100 mg by mouth daily.     omeprazole (PRILOSEC OTC) 20 MG tablet      tiZANidine (ZANAFLEX) 4 MG capsule Take 1 capsule (4 mg total) by mouth 3 (three) times daily. 30 capsule 0   benzonatate (TESSALON) 100 MG capsule Take 1 capsule (100 mg total) by mouth 2 (two) times daily as needed for cough. 20 capsule 0   ibuprofen (ADVIL) 600 MG tablet Take 1 tablet (600 mg total) by mouth every 8 (eight) hours as needed. 30 tablet 0   methylPREDNISolone (MEDROL DOSEPAK) 4 MG TBPK tablet Take as package instructions. 1 each 0   No facility-administered medications prior to visit.    No Known Allergies  Review of Systems  Constitutional:  Negative for chills and fever.  HENT:  Negative for congestion, sinus pressure, sinus pain and sore throat.   Eyes:  Negative for pain and discharge.  Respiratory:  Negative for cough and shortness of breath.   Cardiovascular:  Negative for chest pain and palpitations.  Gastrointestinal:  Negative for abdominal pain, diarrhea, nausea and vomiting.  Endocrine: Negative for polydipsia and polyuria.  Genitourinary:  Negative for dysuria and hematuria.  Musculoskeletal:  Positive for arthralgias and back pain. Negative for neck pain and neck stiffness.  Skin:  Negative for rash.  Neurological:   Negative for dizziness and weakness.  Psychiatric/Behavioral:  Negative for agitation and behavioral problems.        Objective:    Physical Exam Constitutional:      General: She is not in acute distress.    Appearance: She is not diaphoretic.  HENT:     Head: Normocephalic and atraumatic.  Nose: No congestion.     Mouth/Throat:     Mouth: Mucous membranes are moist.     Pharynx: No posterior oropharyngeal erythema.  Eyes:     Extraocular Movements: Extraocular movements intact.  Cardiovascular:     Rate and Rhythm: Normal rate and regular rhythm.     Pulses: Normal pulses.     Heart sounds: Normal heart sounds. No murmur heard. Pulmonary:     Breath sounds: Normal breath sounds. No wheezing or rales.  Musculoskeletal:     Right lower leg: No edema.     Left lower leg: No edema.     Comments: SLR positive on left side No tenderness or swelling of the knee   Skin:    General: Skin is warm.     Findings: No rash.  Neurological:     General: No focal deficit present.     Mental Status: She is alert and oriented to person, place, and time.  Psychiatric:        Mood and Affect: Mood normal.        Behavior: Behavior normal.     BP 138/74   Pulse 61   Ht 5\' 7"  (1.702 m)   Wt 199 lb 3.2 oz (90.4 kg)   LMP  (LMP Unknown)   SpO2 96%   BMI 31.20 kg/m  Wt Readings from Last 3 Encounters:  11/06/21 199 lb 3.2 oz (90.4 kg)  09/20/21 197 lb 3.2 oz (89.4 kg)  06/10/21 201 lb 1.3 oz (91.2 kg)        Assessment & Plan:   Problem List Items Addressed This Visit       Other   Left hip pain - Primary    Likely has underlying hip OA Considering her local hip tenderness, could be bursitis as well Naproxen as needed for hip pain Avoid heavy lifting and frequent bending Warm compresses locally Referred to orthopedic surgery      Relevant Medications   naproxen (NAPROSYN) 500 MG tablet   Other Relevant Orders   Ambulatory referral to Orthopedic Surgery      Meds ordered this encounter  Medications   naproxen (NAPROSYN) 500 MG tablet    Sig: Take 1 tablet (500 mg total) by mouth 2 (two) times daily with a meal.    Dispense:  30 tablet    Refill:  1     Deaglan Lile 06/12/21, MD

## 2021-11-06 NOTE — Patient Instructions (Signed)
Please take Naproxen as needed for hip pain.

## 2021-11-06 NOTE — Assessment & Plan Note (Signed)
Likely has underlying hip OA Considering her local hip tenderness, could be bursitis as well Naproxen as needed for hip pain Avoid heavy lifting and frequent bending Warm compresses locally Referred to orthopedic surgery

## 2021-11-14 ENCOUNTER — Encounter: Payer: Self-pay | Admitting: Orthopaedic Surgery

## 2021-11-14 ENCOUNTER — Ambulatory Visit (INDEPENDENT_AMBULATORY_CARE_PROVIDER_SITE_OTHER): Payer: Federal, State, Local not specified - PPO

## 2021-11-14 ENCOUNTER — Ambulatory Visit: Payer: Federal, State, Local not specified - PPO | Admitting: Orthopaedic Surgery

## 2021-11-14 VITALS — BP 155/85 | HR 61 | Ht 67.0 in | Wt 198.6 lb

## 2021-11-14 DIAGNOSIS — M25552 Pain in left hip: Secondary | ICD-10-CM | POA: Diagnosis not present

## 2021-11-14 DIAGNOSIS — M5432 Sciatica, left side: Secondary | ICD-10-CM

## 2021-11-14 MED ORDER — PREDNISONE 5 MG (21) PO TBPK: ORAL_TABLET | ORAL | 0 refills | Status: DC

## 2021-11-14 NOTE — Patient Instructions (Addendum)
Most of you pain seems to be coming from your back take Tizanidine at bedtime hold on the Naprosyn and use the Prednisone taper return in 1 week for follow up when you get down to two pills a day, go back to using the Naproxen  The prednisone may give you a burst of energy and an increased appetite, and it may keep you from sleeping, try not to increase your activity level too much

## 2021-11-14 NOTE — Progress Notes (Signed)
Subjective:    Patient ID: Molly Knight, female    DOB: 04/01/1956, 66 y.o.   MRN: [id number]  HPI She has had pain in the left hip and left lower leg radiating from the hip to the left foot over the last three to four weeks.  She has no trauma, no weakness. She has seen [facility] for this and I have reviewed the notes.  She has taken aspirin, used ice, heat with no help. It is getting worse.  She has pain when first walking but the pain gets less with more walking.  She has pain at rest.  She has no redness.   Review of Systems  Constitutional:  Positive for activity change.  Musculoskeletal:  Positive for arthralgias, back pain and gait problem.  All other systems reviewed and are negative. For Review of Systems, all other systems reviewed and are negative.  The following is a summary of the past history medically, past history surgically, known current medicines, social history and family history.  This information is gathered electronically by the computer from prior information and documentation.  I review this each visit and have found including this information at this point in the chart is beneficial and informative.   Past Medical History:  Diagnosis Date   Depression    Thyroid disease    Thyroid nodule 09/27/2015   Last Assessment & Plan:  Formatting of this note might be different from the original. Stable.  Her left lobe is normal in size on physical exam with no palpable nodule.    Past Surgical History:  Procedure Laterality Date   ABDOMINAL HYSTERECTOMY  2003   bladder tach     CESAREAN SECTION     SHOULDER SURGERY Bilateral 02/2019   THYROIDECTOMY, PARTIAL  10/2016    Current Outpatient Medications on File Prior to Visit  Medication Sig Dispense Refill   Ascorbic Acid (VITAMIN C) 100 MG tablet Take 100 mg by mouth daily.     CALCIUM PO Take by mouth.     cetirizine (ZYRTEC) 10 MG tablet Take 10 mg by mouth daily.     estradiol (VIVELLE-DOT)  0.05 MG/24HR patch APPLY 1 PATCH(0.05 MG) TOPICALLY TO THE SKIN 2 TIMES A WEEK 24 patch 3   FLUoxetine (PROZAC) 20 MG capsule TAKE 1 CAPSULE(20 MG) BY MOUTH DAILY 30 capsule 3   levothyroxine (SYNTHROID) 75 MCG tablet TAKE 1 TABLET(75 MCG) BY MOUTH DAILY BEFORE BREAKFAST 90 tablet 1   MAGNESIUM PO Take by mouth.     meclizine (ANTIVERT) 25 MG tablet Take 1 tablet (25 mg total) by mouth 3 (three) times daily as needed for dizziness. 30 tablet 1   metroNIDAZOLE (METROGEL) 1 % gel SMARTSIG:1 Topical Every Night     Multiple Vitamins-Minerals (MULTI VITAMIN/MINERALS) TABS Take 1 tablet by mouth daily.     naproxen (NAPROSYN) 500 MG tablet Take 1 tablet (500 mg total) by mouth 2 (two) times daily with a meal. 30 tablet 1   omeprazole (PRILOSEC OTC) 20 MG tablet      progesterone (PROMETRIUM) 100 MG capsule TAKE 1 CAPSULE(100 MG) BY MOUTH DAILY 30 capsule 2   tiZANidine (ZANAFLEX) 4 MG capsule Take 1 capsule (4 mg total) by mouth 3 (three) times daily. 30 capsule 0   VITAMIN D PO Take by mouth.     No current facility-administered medications on file prior to visit.    Social History   Socioeconomic History   Marital status: Married    Spouse  name: Not on file   Number of children: Not on file   Years of education: Not on file   Highest education level: Not on file  Occupational History   Not on file  Tobacco Use   Smoking status: Never   Smokeless tobacco: Never  Vaping Use   Vaping Use: Never used  Substance and Sexual Activity   Alcohol use: Not Currently   Drug use: Never   Sexual activity: Not Currently    Birth control/protection: Surgical    Comment: hyst  Other Topics Concern   Not on file  Social History Narrative   Not on file   Social Determinants of Health   Financial Resource Strain: Low Risk  (08/27/2020)   Overall Financial Resource Strain (CARDIA)    Difficulty of Paying Living Expenses: Not hard at all  Food Insecurity: No Food Insecurity (08/27/2020)   Hunger  Vital Sign    Worried About Running Out of Food in the Last Year: Never true    Ran Out of Food in the Last Year: Never true  Transportation Needs: No Transportation Needs (08/27/2020)   PRAPARE - Administrator, Civil Service (Medical): No    Lack of Transportation (Non-Medical): No  Physical Activity: Insufficiently Active (08/27/2020)   Exercise Vital Sign    Days of Exercise per Week: 3 days    Minutes of Exercise per Session: 30 min  Stress: No Stress Concern Present (08/27/2020)   [employer] of Occupational Health - Occupational Stress Questionnaire    Feeling of Stress : Not at all  Social Connections: Moderately Integrated (08/27/2020)   Social Connection and Isolation Panel [NHANES]    Frequency of Communication with Friends and Family: Once a week    Frequency of Social Gatherings with Friends and Family: More than three times a week    Attends Religious Services: 1 to 4 times per year    Active Member of [employer] or Organizations: No    Attends Banker Meetings: Never    Marital Status: Married  Catering manager Violence: Not At Risk (08/27/2020)   Humiliation, Afraid, Rape, and Kick questionnaire    Fear of Current or Ex-Partner: No    Emotionally Abused: No    Physically Abused: No    Sexually Abused: No    Family History  Problem Relation Age of Onset   Aortic aneurysm Mother    Renal Disease Father    Colon cancer Sister    Throat cancer Brother    Heart attack Sister    Other Sister        tahycardia   Other Brother        heart valve surgery    BP (!) 155/85   Pulse 61   Ht 5\' 7"  (1.702 m)   Wt 198 lb 9.6 oz (90.1 kg)   LMP  (LMP Unknown)   BMI 31.11 kg/m   Body mass index is 31.11 kg/m.      Objective:   Physical Exam Vitals and nursing note reviewed. Exam conducted with a chaperone present.  Constitutional:      Appearance: She is well-developed.  HENT:     Head: Normocephalic and atraumatic.  Eyes:      Conjunctiva/sclera: Conjunctivae normal.     Pupils: Pupils are equal, round, and reactive to light.  Cardiovascular:     Rate and Rhythm: Normal rate and regular rhythm.  Pulmonary:     Effort: Pulmonary effort is normal.  Abdominal:  Palpations: Abdomen is soft.  Musculoskeletal:       Arms:     Cervical back: Normal range of motion and neck supple.  Skin:    General: Skin is warm and dry.  Neurological:     Mental Status: She is alert and oriented to person, place, and time.     Cranial Nerves: No cranial nerve deficit.     Motor: No abnormal muscle tone.     Coordination: Coordination normal.     Deep Tendon Reflexes: Reflexes are normal and symmetric. Reflexes normal.  Psychiatric:        Behavior: Behavior normal.        Thought Content: Thought content normal.        Judgment: Judgment normal.   X-rays were done of the left hip and lower back, reported separately.        Assessment & Plan:   Encounter Diagnoses  Name Primary?   Sciatica, left side Yes   Pain in left hip    She has been on Naprosyn this past week and did not see any improvement.  I have told her to stop this for the next five days and I will call in prednisone dose pack and then she can resume the Naprosyn.  She has Zanaflex at home from an old injury, and I told her to take one at night.  I will see her in one week.  Call if any problem.  Precautions discussed.  She may need MRI.  Electronically Signed Darreld Mclean, MD 7/27/20238:34 AM

## 2021-11-21 ENCOUNTER — Ambulatory Visit: Payer: Federal, State, Local not specified - PPO | Admitting: Orthopaedic Surgery

## 2021-11-21 ENCOUNTER — Encounter: Payer: Self-pay | Admitting: Orthopaedic Surgery

## 2021-11-21 DIAGNOSIS — M5432 Sciatica, left side: Secondary | ICD-10-CM

## 2021-11-21 NOTE — Progress Notes (Signed)
I am better but still hurt  She had good results from the prednisone dose pack.  She is not in as much pain and discomfort but still has left sided sciatica.  She has no weakness.  She has no new trauma. She has resumed the Naprosyn.  Spine/Pelvis examination:  Inspection:  Overall, sacoiliac joint benign and hips nontender; without crepitus or defects.   Thoracic spine inspection: Alignment normal without kyphosis present   Lumbar spine inspection:  Alignment  with normal lumbar lordosis, without scoliosis apparent.   Thoracic spine palpation:  without tenderness of spinal processes   Lumbar spine palpation: without tenderness of lumbar area; without tightness of lumbar muscles    Range of Motion:   Lumbar flexion, forward flexion is normal without pain or tenderness    Lumbar extension is full without pain or tenderness   Left lateral bend is normal without pain or tenderness   Right lateral bend is normal without pain or tenderness   Straight leg raising is normal  Strength & tone: normal   Stability overall normal stability  Encounter Diagnosis  Name Primary?   Sciatica, left side Yes   She still has left sided paresthesias.  I am concerned about HNP.  I would like to get MRI of lumbar spine.  Continue Naprosyn and the Zanaflex.  Return in two weeks.  Call if any problem.  Precautions discussed.  Electronically Signed Darreld Mclean, MD 8/3/20238:06 AM

## 2021-11-21 NOTE — Patient Instructions (Signed)
While we are working on your approval please go ahead and call to schedule your appointment with [facility] in at least 2 weeks.    Central Scheduling [phone]    

## 2021-11-21 NOTE — Addendum Note (Signed)
Addended by: Recardo Evangelist A on: 11/21/2021 08:12 AM   Modules accepted: Orders

## 2021-12-05 ENCOUNTER — Ambulatory Visit: Payer: Federal, State, Local not specified - PPO | Admitting: Orthopaedic Surgery

## 2021-12-06 ENCOUNTER — Ambulatory Visit (HOSPITAL_COMMUNITY)
Admission: RE | Admit: 2021-12-06 | Discharge: 2021-12-06 | Disposition: A | Discharge status: Home / Self Care | Payer: Federal, State, Local not specified - PPO | Source: Ambulatory Visit | Attending: Orthopaedic Surgery | Admitting: Orthopaedic Surgery

## 2021-12-06 DIAGNOSIS — M5432 Sciatica, left side: Secondary | ICD-10-CM | POA: Insufficient documentation

## 2021-12-06 DIAGNOSIS — M47816 Spondylosis without myelopathy or radiculopathy, lumbar region: Secondary | ICD-10-CM | POA: Diagnosis not present

## 2021-12-06 DIAGNOSIS — M545 Low back pain, unspecified: Secondary | ICD-10-CM | POA: Diagnosis not present

## 2021-12-09 ENCOUNTER — Other Ambulatory Visit: Payer: Self-pay | Admitting: Internal Medicine

## 2021-12-09 ENCOUNTER — Ambulatory Visit: Payer: Federal, State, Local not specified - PPO | Admitting: Internal Medicine

## 2021-12-09 ENCOUNTER — Encounter: Payer: Self-pay | Admitting: Internal Medicine

## 2021-12-09 VITALS — BP 148/82 | HR 58 | Ht 67.0 in | Wt 200.4 lb

## 2021-12-09 DIAGNOSIS — E890 Postprocedural hypothyroidism: Secondary | ICD-10-CM

## 2021-12-09 DIAGNOSIS — F3341 Major depressive disorder, recurrent, in partial remission: Secondary | ICD-10-CM

## 2021-12-09 DIAGNOSIS — K219 Gastro-esophageal reflux disease without esophagitis: Secondary | ICD-10-CM | POA: Diagnosis not present

## 2021-12-09 DIAGNOSIS — I10 Essential (primary) hypertension: Secondary | ICD-10-CM

## 2021-12-09 DIAGNOSIS — M5432 Sciatica, left side: Secondary | ICD-10-CM

## 2021-12-09 NOTE — Progress Notes (Signed)
Established Patient Office Visit  Subjective:  Patient ID: Molly Knight, female    DOB: 1955-04-24  Age: 66 y.o. MRN: [id number]  CC:  Chief Complaint  Patient presents with   Follow-up    Follow up    HPI Molly Knight is a 66 y.o. female with past medical history of postoperative hypothyroidism, hot flashes and depression who presents for f/u of her chronic medical conditions.  She has been taking Prozac and uses estradiol patch for hot flashes.  She still complains of night sweats and hair fall.  She denies any recent change in weight or appetite, chronic cough or LAD.   BP is elevated today. Patient denies headache, dizziness, chest pain, dyspnea or palpitations.  Hypothyroidism: She takes levothyroxine 75 mcg QD.  She denies chronic fatigue, recent change in weight or appetite, tremors or palpitations.  She still complains of left-sided hip pain and low back pain.  She has seen Dr. Luna Glasgow for it and did well with oral steroids, but her pain has returned.  She has constant, dull left hip pain, which radiates to left buttock and thigh area.  Denies any recent fall or injury.  She is taking naproxen for hip pain.      Past Medical History:  Diagnosis Date   Depression    Thyroid disease    Thyroid nodule 09/27/2015   Last Assessment & Plan:  Formatting of this note might be different from the original. Stable.  Her left lobe is normal in size on physical exam with no palpable nodule.    Past Surgical History:  Procedure Laterality Date   ABDOMINAL HYSTERECTOMY  2003   bladder tach     CESAREAN SECTION     SHOULDER SURGERY Bilateral 02/2019   THYROIDECTOMY, PARTIAL  10/2016    Family History  Problem Relation Age of Onset   Aortic aneurysm Mother    Renal Disease Father    Colon cancer Sister    Throat cancer Brother    Heart attack Sister    Other Sister        tahycardia   Other Brother        heart valve surgery    Social History    Socioeconomic History   Marital status: Married    Spouse name: Not on file   Number of children: Not on file   Years of education: Not on file   Highest education level: Not on file  Occupational History   Not on file  Tobacco Use   Smoking status: Never   Smokeless tobacco: Never  Vaping Use   Vaping Use: Never used  Substance and Sexual Activity   Alcohol use: Not Currently   Drug use: Never   Sexual activity: Not Currently    Birth control/protection: Surgical    Comment: hyst  Other Topics Concern   Not on file  Social History Narrative   Not on file   Social Determinants of Health   Financial Resource Strain: Low Risk  (08/27/2020)   Overall Financial Resource Strain (CARDIA)    Difficulty of Paying Living Expenses: Not hard at all  Food Insecurity: No Food Insecurity (08/27/2020)   Hunger Vital Sign    Worried About Running Out of Food in the Last Year: Never true    [place] in the Last Year: Never true  Transportation Needs: No Transportation Needs (08/27/2020)   PRAPARE - Transportation    Lack of Transportation (Medical): No    Lack of  Transportation (Non-Medical): No  Physical Activity: Insufficiently Active (08/27/2020)   Exercise Vital Sign    Days of Exercise per Week: 3 days    Minutes of Exercise per Session: 30 min  Stress: No Stress Concern Present (08/27/2020)   [place]    Feeling of Stress : Not at all  Social Connections: Moderately Integrated (08/27/2020)   Social Connection and Isolation Panel [NHANES]    Frequency of Communication with Friends and Family: Once a week    Frequency of Social Gatherings with Friends and Family: More than three times a week    Attends Religious Services: 1 to 4 times per year    Active Member of [employer] or Organizations: No    Attends Archivist Meetings: Never    Marital Status: Married  Human resources officer Violence: Not At Risk  (08/27/2020)   Humiliation, Afraid, Rape, and Kick questionnaire    Fear of Current or Ex-Partner: No    Emotionally Abused: No    Physically Abused: No    Sexually Abused: No    Outpatient Medications Prior to Visit  Medication Sig Dispense Refill   CALCIUM PO Take by mouth.     cetirizine (ZYRTEC) 10 MG tablet Take 10 mg by mouth daily.     estradiol (VIVELLE-DOT) 0.05 MG/24HR patch APPLY 1 PATCH(0.05 MG) TOPICALLY TO THE SKIN 2 TIMES A WEEK 24 patch 3   FLUoxetine (PROZAC) 20 MG capsule TAKE 1 CAPSULE(20 MG) BY MOUTH DAILY 30 capsule 3   MAGNESIUM PO Take by mouth.     meclizine (ANTIVERT) 25 MG tablet Take 1 tablet (25 mg total) by mouth 3 (three) times daily as needed for dizziness. 30 tablet 1   metroNIDAZOLE (METROGEL) 1 % gel SMARTSIG:1 Topical Every Night     Multiple Vitamins-Minerals (MULTI VITAMIN/MINERALS) TABS Take 1 tablet by mouth daily.     naproxen (NAPROSYN) 500 MG tablet Take 1 tablet (500 mg total) by mouth 2 (two) times daily with a meal. 30 tablet 1   progesterone (PROMETRIUM) 100 MG capsule TAKE 1 CAPSULE(100 MG) BY MOUTH DAILY 30 capsule 2   VITAMIN D PO Take by mouth.     levothyroxine (SYNTHROID) 75 MCG tablet TAKE 1 TABLET(75 MCG) BY MOUTH DAILY BEFORE BREAKFAST 90 tablet 1   predniSONE (STERAPRED UNI-PAK 21 TAB) 5 MG (21) TBPK tablet Take 6 pills first day; 5 pills second day; 4 pills third day; 3 pills fourth day; 2 pills next day and 1 pill last day. (Patient not taking: Reported on 12/09/2021) 21 tablet 0   No facility-administered medications prior to visit.    No Known Allergies  ROS Review of Systems  Constitutional:  Negative for chills and fever.  HENT:  Negative for congestion, sinus pressure, sinus pain and sore throat.   Eyes:  Negative for pain and discharge.  Respiratory:  Negative for cough and shortness of breath.   Cardiovascular:  Negative for chest pain and palpitations.  Gastrointestinal:  Negative for abdominal pain, diarrhea, nausea  and vomiting.  Endocrine: Negative for polydipsia and polyuria.  Genitourinary:  Negative for dysuria and hematuria.  Musculoskeletal:  Positive for arthralgias and back pain. Negative for neck pain and neck stiffness.  Skin:  Negative for rash.  Neurological:  Negative for dizziness and weakness.  Psychiatric/Behavioral:  Negative for agitation and behavioral problems.       Objective:    Physical Exam Constitutional:      General: She is  not in acute distress.    Appearance: She is not diaphoretic.  HENT:     Head: Normocephalic and atraumatic.     Nose: No congestion.     Mouth/Throat:     Mouth: Mucous membranes are moist.     Pharynx: No posterior oropharyngeal erythema.  Eyes:     Extraocular Movements: Extraocular movements intact.  Cardiovascular:     Rate and Rhythm: Normal rate and regular rhythm.     Pulses: Normal pulses.     Heart sounds: Normal heart sounds. No murmur heard. Pulmonary:     Breath sounds: Normal breath sounds. No wheezing or rales.  Musculoskeletal:     Right lower leg: No edema.     Left lower leg: No edema.     Comments: SLR positive on left side No tenderness or swelling of the knee   Skin:    General: Skin is warm.     Findings: No rash.  Neurological:     General: No focal deficit present.     Mental Status: She is alert and oriented to person, place, and time.  Psychiatric:        Mood and Affect: Mood normal.        Behavior: Behavior normal.     BP (!) 148/82 (BP Location: Right Arm, Cuff Size: Normal)   Pulse (!) 58   Ht _0  (1.702 m)   Wt 200 lb 6.4 oz (90.9 kg)   LMP  (LMP Unknown)   SpO2 95%   BMI 31.39 kg/m  Wt Readings from Last 3 Encounters:  12/09/21 200 lb 6.4 oz (90.9 kg)  11/14/21 198 lb 9.6 oz (90.1 kg)  11/06/21 199 lb 3.2 oz (90.4 kg)    Lab Results  Component Value Date   TSH 2.110 06/10/2021   Lab Results  Component Value Date   WBC 5.0 06/10/2021   HGB 13.5 06/10/2021   HCT 39.8 06/10/2021    MCV 92 06/10/2021   PLT 272 06/10/2021   Lab Results  Component Value Date   NA 138 06/10/2021   K 4.6 06/10/2021   CO2 26 06/10/2021   GLUCOSE 93 06/10/2021   BUN 12 06/10/2021   CREATININE 0.73 06/10/2021   BILITOT 0.3 06/10/2021   ALKPHOS 80 06/10/2021   AST 19 06/10/2021   ALT 16 06/10/2021   PROT 6.6 06/10/2021   ALBUMIN 4.4 06/10/2021   CALCIUM 9.1 06/10/2021   ANIONGAP 8 12/15/2020   EGFR 92 06/10/2021   Lab Results  Component Value Date   CHOL 240 (H) 06/10/2021   Lab Results  Component Value Date   HDL 79 06/10/2021   Lab Results  Component Value Date   LDLCALC 148 (H) 06/10/2021   Lab Results  Component Value Date   TRIG 79 06/10/2021   Lab Results  Component Value Date   CHOLHDL 3.0 06/10/2021   Lab Results  Component Value Date   HGBA1C 5.7 (H) 06/10/2021      Assessment & Plan:   Problem List Items Addressed This Visit       Cardiovascular and Mediastinum   Essential hypertension - Primary    BP Readings from Last 1 Encounters:  12/09/21 (!) 148/82  New onset Advised DASH diet and moderate exercise/walking, at least 150 mins/week Advised to check BP at home and contact if BP > 140/90 persistently If elevated in the next visit, will add ARB        Digestive   Gastroesophageal reflux disease  Well-controlled with Pepcid PRN        Endocrine   Postoperative hypothyroidism    Due to h/o multinodular goiter Lab Results  Component Value Date   TSH 2.110 06/10/2021   Takes Levothyroxine 75 mcg QD        Nervous and Auditory   Sciatica of left side    Likely has underlying hip OA Naproxen as needed for hip pain Avoid heavy lifting and frequent bending Warm compresses locally Followed by orthopedic surgery        Other   Depression    Flowsheet Row Office Visit from 09/20/2021 in [facility]  PHQ-9 Total Score 0  On Prozac, now better since moving to a new home Sleep is better as well.       No  orders of the defined types were placed in this encounter.   Follow-up: Return in about 6 weeks (around 01/20/2022) for HTN.    Lindell Spar, MD

## 2021-12-09 NOTE — Patient Instructions (Signed)
Please follow DASH diet and perform moderate exercise/walking at least 150 mins/week.  Please contact us if your BP stays more than 140/90 on 3 consecutive readings.  Please continue taking other medications as prescribed.

## 2021-12-13 NOTE — Assessment & Plan Note (Signed)
Flowsheet Row Office Visit from 09/20/2021 in [facility]  PHQ-9 Total Score 0     On Prozac, now better since moving to a new home Sleep is better as well. 

## 2021-12-13 NOTE — Assessment & Plan Note (Signed)
Well controlled with Pepcid PRN 

## 2021-12-13 NOTE — Assessment & Plan Note (Signed)
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

## 2021-12-13 NOTE — Assessment & Plan Note (Signed)
Due to h/o multinodular goiter Lab Results  Component Value Date   TSH 2.110 06/10/2021   Takes Levothyroxine 75 mcg QD 

## 2021-12-13 NOTE — Assessment & Plan Note (Signed)
BP Readings from Last 1 Encounters:  12/09/21 (!) 148/82   New onset Advised DASH diet and moderate exercise/walking, at least 150 mins/week Advised to check BP at home and contact if BP > 140/90 persistently If elevated in the next visit, will add ARB

## 2021-12-17 ENCOUNTER — Encounter: Payer: Self-pay | Admitting: Orthopaedic Surgery

## 2021-12-17 ENCOUNTER — Ambulatory Visit: Payer: Federal, State, Local not specified - PPO | Admitting: Orthopaedic Surgery

## 2021-12-17 DIAGNOSIS — M5432 Sciatica, left side: Secondary | ICD-10-CM

## 2021-12-17 NOTE — Progress Notes (Signed)
I am a little better.  She had MRI of the lumbar spine showing: IMPRESSION: 1. Mild lumbar spondylosis including a small right foraminal disc protrusion at L3-L4 resulting in mild right foraminal stenosis. No canal stenosis at any level. 2. No findings to explain left-sided radicular symptoms.  I have explained the findings to her.  She does not need surgery.  I will have her take the Naprosyn regularly twice a day.  She is not doing that now.  I want to see if that helps her.  She agrees to do this.  Her pain is less.  I have independently reviewed the MRI.      Spine/Pelvis examination:  Inspection:  Overall, sacoiliac joint benign and hips nontender; without crepitus or defects.   Thoracic spine inspection: Alignment normal without kyphosis present   Lumbar spine inspection:  Alignment  with normal lumbar lordosis, without scoliosis apparent.   Thoracic spine palpation:  without tenderness of spinal processes   Lumbar spine palpation: without tenderness of lumbar area; without tightness of lumbar muscles    Range of Motion:   Lumbar flexion, forward flexion is normal without pain or tenderness    Lumbar extension is full without pain or tenderness   Left lateral bend is normal without pain or tenderness   Right lateral bend is normal without pain or tenderness   Straight leg raising is normal  Strength & tone: normal   Stability overall normal stability  Encounter Diagnosis  Name Primary?   Sciatica, left side Yes   Take the naprosyn bid.  Return in two weeks.  Call if any problem.  Precautions discussed.  Electronically Signed Darreld Mclean, MD 8/29/20238:33 AM

## 2021-12-19 DIAGNOSIS — L718 Other rosacea: Secondary | ICD-10-CM | POA: Diagnosis not present

## 2021-12-19 DIAGNOSIS — X32XXXD Exposure to sunlight, subsequent encounter: Secondary | ICD-10-CM | POA: Diagnosis not present

## 2021-12-19 DIAGNOSIS — L821 Other seborrheic keratosis: Secondary | ICD-10-CM | POA: Diagnosis not present

## 2021-12-19 DIAGNOSIS — D1801 Hemangioma of skin and subcutaneous tissue: Secondary | ICD-10-CM | POA: Diagnosis not present

## 2021-12-19 DIAGNOSIS — L570 Actinic keratosis: Secondary | ICD-10-CM | POA: Diagnosis not present

## 2021-12-19 DIAGNOSIS — D225 Melanocytic nevi of trunk: Secondary | ICD-10-CM | POA: Diagnosis not present

## 2021-12-31 ENCOUNTER — Ambulatory Visit: Payer: Federal, State, Local not specified - PPO | Admitting: Orthopaedic Surgery

## 2021-12-31 ENCOUNTER — Encounter: Payer: Self-pay | Admitting: Orthopaedic Surgery

## 2021-12-31 VITALS — BP 143/82 | HR 61 | Ht 67.0 in | Wt 195.0 lb

## 2021-12-31 DIAGNOSIS — M5432 Sciatica, left side: Secondary | ICD-10-CM | POA: Diagnosis not present

## 2021-12-31 NOTE — Progress Notes (Signed)
I feel better.  Her back pain is less on the Naprosyn.  She is walking more and doing more.  She has no weakness.  She still has some sciatica at times on the left but the pain is less and it is very infrequent now.  She has no new trauma.  Spine/Pelvis examination:  Inspection:  Overall, sacoiliac joint benign and hips nontender; without crepitus or defects.   Thoracic spine inspection: Alignment normal without kyphosis present   Lumbar spine inspection:  Alignment  with normal lumbar lordosis, without scoliosis apparent.   Thoracic spine palpation:  without tenderness of spinal processes   Lumbar spine palpation: without tenderness of lumbar area; without tightness of lumbar muscles    Range of Motion:   Lumbar flexion, forward flexion is normal without pain or tenderness    Lumbar extension is full without pain or tenderness   Left lateral bend is normal without pain or tenderness   Right lateral bend is normal without pain or tenderness   Straight leg raising is normal  Strength & tone: normal   Stability overall normal stability  Encounter Diagnosis  Name Primary?   Sciatica, left side Yes   Continue the Naprosyn.  Return in six weeks.  Call if any problem.  Precautions discussed.  Electronically Signed Darreld Mclean, MD 9/12/20238:22 AM

## 2022-01-01 DIAGNOSIS — K029 Dental caries, unspecified: Secondary | ICD-10-CM | POA: Diagnosis not present

## 2022-01-01 DIAGNOSIS — K08409 Partial loss of teeth, unspecified cause, unspecified class: Secondary | ICD-10-CM | POA: Insufficient documentation

## 2022-01-03 ENCOUNTER — Telehealth: Payer: Self-pay | Admitting: Radiology

## 2022-01-03 ENCOUNTER — Encounter: Payer: Self-pay | Admitting: Orthopaedic Surgery

## 2022-01-03 DIAGNOSIS — M25552 Pain in left hip: Secondary | ICD-10-CM

## 2022-01-04 MED ORDER — NAPROXEN 500 MG PO TABS: 500.0000 mg | ORAL_TABLET | Freq: Two times a day (BID) | ORAL | 1 refills | Status: DC

## 2022-01-09 ENCOUNTER — Encounter: Payer: Self-pay | Admitting: Internal Medicine

## 2022-01-09 ENCOUNTER — Ambulatory Visit: Payer: Federal, State, Local not specified - PPO | Admitting: Internal Medicine

## 2022-01-09 ENCOUNTER — Telehealth: Payer: Self-pay

## 2022-01-09 VITALS — BP 138/70 | HR 88 | Ht 67.0 in | Wt 195.6 lb

## 2022-01-09 DIAGNOSIS — I10 Essential (primary) hypertension: Secondary | ICD-10-CM | POA: Diagnosis not present

## 2022-01-09 DIAGNOSIS — R232 Flushing: Secondary | ICD-10-CM

## 2022-01-09 DIAGNOSIS — E890 Postprocedural hypothyroidism: Secondary | ICD-10-CM

## 2022-01-09 DIAGNOSIS — Z23 Encounter for immunization: Secondary | ICD-10-CM | POA: Diagnosis not present

## 2022-01-09 MED ORDER — LOSARTAN POTASSIUM 25 MG PO TABS: 25.0000 mg | ORAL_TABLET | Freq: Every day | ORAL | 3 refills | Status: DC

## 2022-01-09 NOTE — Assessment & Plan Note (Signed)
On Prozac Was using Estradiol patch, has stopped using it now

## 2022-01-09 NOTE — Assessment & Plan Note (Signed)
[place] Office Visit from 09/20/2021 in [facility]  PHQ-9 Total Score 0     On Prozac, now better since moving to a new home Sleep is better as well.

## 2022-01-09 NOTE — Telephone Encounter (Signed)
Called patient back and patient said the nurse wrote down wrong weight, weighed in at 195.6 and nurse wrote down 156  pounds. Paperwork is in correct. Can this updated to 195.6 lbs.

## 2022-01-09 NOTE — Telephone Encounter (Signed)
Can you please find out what she needs?

## 2022-01-09 NOTE — Assessment & Plan Note (Addendum)
BP Readings from Last 1 Encounters:  01/09/22 138/70   New onset Home BP readings mostly in 140s/80s, she has headache episodes as well Started Losartan 25 mg QD Check BMP after 2 weeks Advised DASH diet and moderate exercise/walking, at least 150 mins/week Advised to check BP at home and contact if BP > 140/90 persistently

## 2022-01-09 NOTE — Progress Notes (Signed)
Established Patient Office Visit  Subjective:  Patient ID: Molly Knight, female    DOB: 1955/05/12  Age: 66 y.o. MRN: [id number]  CC:  Chief Complaint  Patient presents with   Follow-up    Follow up    Hypertension    HPI Molly Knight is a 66 y.o. female with past medical history of postoperative hypothyroidism, hot flashes and depression who presents for f/u of her chronic medical conditions.  BP is slightly elevated today. She has brought home BP readings, which show around 140s/80s mostly. She c/o generalized headaches sometimes. Patient denies dizziness, chest pain, dyspnea or palpitations.   Hypothyroidism: She takes levothyroxine 75 mcg QD.  She denies chronic fatigue, recent change in weight or appetite, tremors or palpitations.  She has been taking Prozac, but has stopped estradiol patch for hot flashes. She has mild hot flashes, but is manageable for now.    Past Medical History:  Diagnosis Date   Depression    Thyroid disease    Thyroid nodule 09/27/2015   Last Assessment & Plan:  Formatting of this note might be different from the original. Stable.  Her left lobe is normal in size on physical exam with no palpable nodule.    Past Surgical History:  Procedure Laterality Date   ABDOMINAL HYSTERECTOMY  2003   bladder tach     CESAREAN SECTION     SHOULDER SURGERY Bilateral 02/2019   THYROIDECTOMY, PARTIAL  10/2016    Family History  Problem Relation Age of Onset   Aortic aneurysm Mother    Renal Disease Father    Colon cancer Sister    Throat cancer Brother    Heart attack Sister    Other Sister        tahycardia   Other Brother        heart valve surgery    Social History   Socioeconomic History   Marital status: Married    Spouse name: Not on file   Number of children: Not on file   Years of education: Not on file   Highest education level: Not on file  Occupational History   Not on file  Tobacco Use   Smoking status: Never    Smokeless tobacco: Never  Vaping Use   Vaping Use: Never used  Substance and Sexual Activity   Alcohol use: Not Currently   Drug use: Never   Sexual activity: Not Currently    Birth control/protection: Surgical    Comment: hyst  Other Topics Concern   Not on file  Social History Narrative   Not on file   Social Determinants of Health   Financial Resource Strain: Low Risk  (08/27/2020)   Overall Financial Resource Strain (CARDIA)    Difficulty of Paying Living Expenses: Not hard at all  Food Insecurity: No Food Insecurity (08/27/2020)   Hunger Vital Sign    Worried About Running Out of Food in the Last Year: Never true    Ran Out of Food in the Last Year: Never true  Transportation Needs: No Transportation Needs (08/27/2020)   PRAPARE - Administrator, Civil Service (Medical): No    Lack of Transportation (Non-Medical): No  Physical Activity: Insufficiently Active (08/27/2020)   Exercise Vital Sign    Days of Exercise per Week: 3 days    Minutes of Exercise per Session: 30 min  Stress: No Stress Concern Present (08/27/2020)   [employer] of Occupational Health - Occupational Stress Questionnaire    Feeling of  Stress : Not at all  Social Connections: Moderately Integrated (08/27/2020)   Social Connection and Isolation Panel [NHANES]    Frequency of Communication with Friends and Family: Once a week    Frequency of Social Gatherings with Friends and Family: More than three times a week    Attends Religious Services: 1 to 4 times per year    Active Member of [employer] or Organizations: No    Attends Archivist Meetings: Never    Marital Status: Married  Human resources officer Violence: Not At Risk (08/27/2020)   Humiliation, Afraid, Rape, and Kick questionnaire    Fear of Current or Ex-Partner: No    Emotionally Abused: No    Physically Abused: No    Sexually Abused: No    Outpatient Medications Prior to Visit  Medication Sig Dispense Refill   CALCIUM PO Take by  mouth.     cetirizine (ZYRTEC) 10 MG tablet Take 10 mg by mouth daily.     FLUoxetine (PROZAC) 20 MG capsule TAKE 1 CAPSULE(20 MG) BY MOUTH DAILY 30 capsule 3   levothyroxine (SYNTHROID) 75 MCG tablet TAKE 1 TABLET(75 MCG) BY MOUTH DAILY BEFORE BREAKFAST 90 tablet 1   MAGNESIUM PO Take by mouth.     meclizine (ANTIVERT) 25 MG tablet Take 1 tablet (25 mg total) by mouth 3 (three) times daily as needed for dizziness. 30 tablet 1   metroNIDAZOLE (METROGEL) 1 % gel SMARTSIG:1 Topical Every Night     Multiple Vitamins-Minerals (MULTI VITAMIN/MINERALS) TABS Take 1 tablet by mouth daily.     naproxen (NAPROSYN) 500 MG tablet Take 1 tablet (500 mg total) by mouth 2 (two) times daily with a meal. 30 tablet 1   VITAMIN D PO Take by mouth.     No facility-administered medications prior to visit.    No Known Allergies  ROS Review of Systems  Constitutional:  Negative for chills and fever.  HENT:  Negative for congestion, sinus pressure, sinus pain and sore throat.   Eyes:  Negative for pain and discharge.  Respiratory:  Negative for cough and shortness of breath.   Cardiovascular:  Negative for chest pain and palpitations.  Gastrointestinal:  Negative for abdominal pain, diarrhea, nausea and vomiting.  Endocrine: Negative for polydipsia and polyuria.  Genitourinary:  Negative for dysuria and hematuria.  Musculoskeletal:  Positive for arthralgias and back pain. Negative for neck pain and neck stiffness.  Skin:  Negative for rash.  Neurological:  Positive for headaches. Negative for dizziness and weakness.  Psychiatric/Behavioral:  Negative for agitation and behavioral problems.       Objective:    Physical Exam Constitutional:      General: She is not in acute distress.    Appearance: She is not diaphoretic.  HENT:     Head: Normocephalic and atraumatic.     Nose: No congestion.     Mouth/Throat:     Mouth: Mucous membranes are moist.     Pharynx: No posterior oropharyngeal erythema.   Eyes:     Extraocular Movements: Extraocular movements intact.  Cardiovascular:     Rate and Rhythm: Normal rate and regular rhythm.     Pulses: Normal pulses.     Heart sounds: Normal heart sounds. No murmur heard. Pulmonary:     Breath sounds: Normal breath sounds. No wheezing or rales.  Musculoskeletal:     Right lower leg: No edema.     Left lower leg: No edema.  Skin:    General: Skin is warm.  Findings: No rash.  Neurological:     General: No focal deficit present.     Mental Status: She is alert and oriented to person, place, and time.  Psychiatric:        Mood and Affect: Mood normal.        Behavior: Behavior normal.     BP 138/70 (BP Location: Right Arm)   Pulse 88   Ht 5\' 7"  (1.702 m)   Wt 157 lb (71.2 kg)   LMP  (LMP Unknown)   SpO2 99%   BMI 24.59 kg/m  Wt Readings from Last 3 Encounters:  01/09/22 157 lb (71.2 kg)  12/31/21 195 lb (88.5 kg)  12/09/21 200 lb 6.4 oz (90.9 kg)    Lab Results  Component Value Date   TSH 2.110 06/10/2021   Lab Results  Component Value Date   WBC 5.0 06/10/2021   HGB 13.5 06/10/2021   HCT 39.8 06/10/2021   MCV 92 06/10/2021   PLT 272 06/10/2021   Lab Results  Component Value Date   NA 138 06/10/2021   K 4.6 06/10/2021   CO2 26 06/10/2021   GLUCOSE 93 06/10/2021   BUN 12 06/10/2021   CREATININE 0.73 06/10/2021   BILITOT 0.3 06/10/2021   ALKPHOS 80 06/10/2021   AST 19 06/10/2021   ALT 16 06/10/2021   PROT 6.6 06/10/2021   ALBUMIN 4.4 06/10/2021   CALCIUM 9.1 06/10/2021   ANIONGAP 8 12/15/2020   EGFR 92 06/10/2021   Lab Results  Component Value Date   CHOL 240 (H) 06/10/2021   Lab Results  Component Value Date   HDL 79 06/10/2021   Lab Results  Component Value Date   LDLCALC 148 (H) 06/10/2021   Lab Results  Component Value Date   TRIG 79 06/10/2021   Lab Results  Component Value Date   CHOLHDL 3.0 06/10/2021   Lab Results  Component Value Date   HGBA1C 5.7 (H) 06/10/2021       Assessment & Plan:   Problem List Items Addressed This Visit       Cardiovascular and Mediastinum   Hot flashes    On Prozac Was using Estradiol patch, has stopped using it now      Relevant Medications   losartan (COZAAR) 25 MG tablet   Essential hypertension - Primary    BP Readings from Last 1 Encounters:  01/09/22 138/70  New onset Home BP readings mostly in 140s/80s, she has headache episodes as well Started Losartan 25 mg QD Check BMP after 2 weeks Advised DASH diet and moderate exercise/walking, at least 150 mins/week Advised to check BP at home and contact if BP > 140/90 persistently      Relevant Medications   losartan (COZAAR) 25 MG tablet   Other Relevant Orders   Basic Metabolic Panel (BMET)     Endocrine   Postoperative hypothyroidism    Due to h/o multinodular goiter Lab Results  Component Value Date   TSH 2.110 06/10/2021   Takes Levothyroxine 75 mcg QD      Relevant Orders   TSH + free T4   Other Visit Diagnoses     Need for immunization against influenza       Relevant Orders   Flu Vaccine QUAD 41mo+IM (Fluarix, Fluzone & Alfiuria Quad PF) (Completed)       Meds ordered this encounter  Medications   losartan (COZAAR) 25 MG tablet    Sig: Take 1 tablet (25 mg total) by mouth daily.  Dispense:  30 tablet    Refill:  3    Follow-up: Return in about 4 months (around 05/11/2022) for HTN.    Lindell Spar, MD

## 2022-01-09 NOTE — Assessment & Plan Note (Signed)
Due to h/o multinodular goiter Lab Results  Component Value Date   TSH 2.110 06/10/2021   Takes Levothyroxine 75 mcg QD

## 2022-01-09 NOTE — Telephone Encounter (Signed)
Patient called and forgot to mention something to the nurse this morning at her visit. Call # [phone].

## 2022-01-09 NOTE — Patient Instructions (Signed)
Please start taking Losartan as prescribed.  Please continue to follow DASH diet and perform moderate exercise/walking at least 150 mins/week.  Please maintain at least 64 ounces of fluid intake in a day.

## 2022-01-09 NOTE — Telephone Encounter (Signed)
Please let her know that this has been updated  

## 2022-01-09 NOTE — Telephone Encounter (Signed)
Please let her know that this has been updated

## 2022-01-13 ENCOUNTER — Ambulatory Visit: Payer: Federal, State, Local not specified - PPO | Admitting: Internal Medicine

## 2022-01-24 DIAGNOSIS — I10 Essential (primary) hypertension: Secondary | ICD-10-CM | POA: Diagnosis not present

## 2022-01-24 DIAGNOSIS — E890 Postprocedural hypothyroidism: Secondary | ICD-10-CM | POA: Diagnosis not present

## 2022-01-25 LAB — BASIC METABOLIC PANEL
BUN/Creatinine Ratio: 19 (ref 12–28)
BUN: 16 mg/dL (ref 8–27)
CO2: 22 mmol/L (ref 20–29)
Calcium: 9.2 mg/dL (ref 8.7–10.3)
Chloride: 103 mmol/L (ref 96–106)
Creatinine, Ser: 0.84 mg/dL (ref 0.57–1.00)
Glucose: 81 mg/dL (ref 70–99)
Potassium: 4.7 mmol/L (ref 3.5–5.2)
Sodium: 141 mmol/L (ref 134–144)
eGFR: 78 mL/min/{1.73_m2} (ref >59)

## 2022-01-25 LAB — TSH+FREE T4
Free T4: 1.26 ng/dL (ref 0.82–1.77)
TSH: 1.92 u[IU]/mL (ref 0.450–4.500)

## 2022-01-28 ENCOUNTER — Ambulatory Visit: Payer: Federal, State, Local not specified - PPO | Admitting: Internal Medicine

## 2022-02-07 ENCOUNTER — Other Ambulatory Visit: Payer: Self-pay | Admitting: Internal Medicine

## 2022-02-07 DIAGNOSIS — F320 Major depressive disorder, single episode, mild: Secondary | ICD-10-CM

## 2022-02-11 ENCOUNTER — Ambulatory Visit (INDEPENDENT_AMBULATORY_CARE_PROVIDER_SITE_OTHER): Payer: Federal, State, Local not specified - PPO | Admitting: Orthopaedic Surgery

## 2022-02-11 ENCOUNTER — Encounter: Payer: Self-pay | Admitting: Orthopaedic Surgery

## 2022-02-11 DIAGNOSIS — M25552 Pain in left hip: Secondary | ICD-10-CM

## 2022-02-11 NOTE — Patient Instructions (Signed)
Continue the Naproxen as the weather is changing.

## 2022-02-11 NOTE — Progress Notes (Signed)
I am better  She is on the Naprosyn and has less hip pain on the left.  She was in [facility] recently and was able to walk fairly well.  She has no new trauma, no weakness. She has no GI effects.  Left hip has good ROM, no limp today.  Encounter Diagnosis  Name Primary?   Left hip pain Yes   Return in six weeks.  Continue Naprosyn daily or every other day.  Call if any problem.  Precautions discussed.  Electronically Signed Sanjuana Kava, MD 10/24/20238:42 AM

## 2022-03-25 ENCOUNTER — Encounter: Payer: Self-pay | Admitting: Orthopaedic Surgery

## 2022-03-25 ENCOUNTER — Ambulatory Visit (INDEPENDENT_AMBULATORY_CARE_PROVIDER_SITE_OTHER): Payer: Federal, State, Local not specified - PPO | Admitting: Orthopaedic Surgery

## 2022-03-25 VITALS — BP 136/80

## 2022-03-25 DIAGNOSIS — M5432 Sciatica, left side: Secondary | ICD-10-CM

## 2022-03-25 NOTE — Progress Notes (Signed)
I feel better.  She has back pain but not as severe and not as often.  She takes her Naprosyn and it helps a lot.  She has no new trauma, no weakness.  She is doing her exercises.  Spine/Pelvis examination:  Inspection:  Overall, sacoiliac joint benign and hips nontender; without crepitus or defects.   Thoracic spine inspection: Alignment normal without kyphosis present   Lumbar spine inspection:  Alignment  with normal lumbar lordosis, without scoliosis apparent.   Thoracic spine palpation:  without tenderness of spinal processes   Lumbar spine palpation: without tenderness of lumbar area; without tightness of lumbar muscles    Range of Motion:   Lumbar flexion, forward flexion is normal without pain or tenderness    Lumbar extension is full without pain or tenderness   Left lateral bend is normal without pain or tenderness   Right lateral bend is normal without pain or tenderness   Straight leg raising is normal  Strength & tone: normal   Stability overall normal stability  Encounter Diagnosis  Name Primary?   Sciatica, left side Yes   Return prn.  Call if any problem.  Precautions discussed.  Electronically Signed Darreld Mclean, MD 12/5/20238:10 AM

## 2022-03-28 ENCOUNTER — Other Ambulatory Visit: Payer: Self-pay | Admitting: Internal Medicine

## 2022-03-28 DIAGNOSIS — R232 Flushing: Secondary | ICD-10-CM

## 2022-04-04 ENCOUNTER — Ambulatory Visit (INDEPENDENT_AMBULATORY_CARE_PROVIDER_SITE_OTHER): Payer: Federal, State, Local not specified - PPO | Admitting: Internal Medicine

## 2022-04-04 ENCOUNTER — Encounter: Payer: Self-pay | Admitting: Internal Medicine

## 2022-04-04 VITALS — BP 128/79 | HR 76 | Ht 67.0 in | Wt 202.8 lb

## 2022-04-04 DIAGNOSIS — H66002 Acute suppurative otitis media without spontaneous rupture of ear drum, left ear: Secondary | ICD-10-CM | POA: Diagnosis not present

## 2022-04-04 MED ORDER — AMOXICILLIN-POT CLAVULANATE 875-125 MG PO TABS: 1.0000 | ORAL_TABLET | Freq: Two times a day (BID) | ORAL | 0 refills | Status: DC

## 2022-04-04 NOTE — Patient Instructions (Signed)
It was a pleasure to see you today.  Thank you for giving [place] the opportunity to be involved in your care.  Below is a brief recap of your visit and next steps.  We will plan to see you again in January  Summary I have prescribed Augmentin x 10 days for treatment of left ear infection Please let us know if your symptoms do not improve after completing antibiotics You have follow up with Dr. Allena Katz scheduled for 05/13/22

## 2022-04-04 NOTE — Progress Notes (Signed)
   Acute Office Visit  Subjective:     Patient ID: Molly Knight, female    DOB: May 14, 1955, 66 y.o.   MRN: [id number]  Chief Complaint  Patient presents with   Ear Problem    Fluid in ear noticed 04/02/2022   Molly Knight presents today for an acute visit for left ear discomfort.  She reports feeling fullness and pain in her left ear starting 2 days ago.  She states her current symptoms are what she has previously experienced when she has had an ear infection.  She denies systemic symptoms of fever/chills and fatigue.  She has not tried taking any medications for symptom relief.  Review of Systems  HENT:  Positive for ear pain (left ear).       Objective:    BP 128/79   Pulse 76   Ht 5\' 7"  (1.702 m)   Wt 202 lb 12.8 oz (92 kg)   LMP  (LMP Unknown)   SpO2 93%   BMI 31.76 kg/m   Physical Exam HENT:     Left Ear: Tenderness present. Tympanic membrane is erythematous and bulging.       Assessment & Plan:   Problem List Items Addressed This Visit       Non-recurrent acute suppurative otitis media of left ear without spontaneous rupture of tympanic membrane - Primary    Her current symptoms and exam findings today are concerning for acute otitis media.  I have prescribed Augmentin x 10 days for treatment.  She was instructed to return to care if her symptoms do not improve despite antibiotic treatment.  Otherwise she has previously scheduled follow-up with Dr. on 05/13/2022      Meds ordered this encounter  Medications   amoxicillin-clavulanate (AUGMENTIN) 875-125 MG tablet    Sig: Take 1 tablet by mouth 2 (two) times daily.    Dispense:  20 tablet    Refill:  0    Return if symptoms worsen or fail to improve.  05/15/2022, MD

## 2022-04-11 DIAGNOSIS — H66002 Acute suppurative otitis media without spontaneous rupture of ear drum, left ear: Secondary | ICD-10-CM | POA: Insufficient documentation

## 2022-04-11 NOTE — Assessment & Plan Note (Signed)
Her current symptoms and exam findings today are concerning for acute otitis media.  I have prescribed Augmentin x 10 days for treatment.  She was instructed to return to care if her symptoms do not improve despite antibiotic treatment.  Otherwise she has previously scheduled follow-up with Dr. Allena Katz on 05/13/2022

## 2022-04-15 ENCOUNTER — Other Ambulatory Visit: Payer: Self-pay | Admitting: Internal Medicine

## 2022-04-15 ENCOUNTER — Encounter: Payer: Self-pay | Admitting: Internal Medicine

## 2022-04-15 DIAGNOSIS — H66002 Acute suppurative otitis media without spontaneous rupture of ear drum, left ear: Secondary | ICD-10-CM

## 2022-04-15 DIAGNOSIS — H7292 Unspecified perforation of tympanic membrane, left ear: Secondary | ICD-10-CM

## 2022-04-25 ENCOUNTER — Other Ambulatory Visit: Payer: Self-pay

## 2022-04-25 ENCOUNTER — Encounter: Payer: Self-pay | Admitting: Internal Medicine

## 2022-04-25 DIAGNOSIS — I10 Essential (primary) hypertension: Secondary | ICD-10-CM

## 2022-04-25 MED ORDER — LOSARTAN POTASSIUM 25 MG PO TABS: 25.0000 mg | ORAL_TABLET | Freq: Every day | ORAL | 3 refills | Status: DC

## 2022-04-28 ENCOUNTER — Other Ambulatory Visit: Payer: Self-pay | Admitting: Internal Medicine

## 2022-04-28 DIAGNOSIS — I10 Essential (primary) hypertension: Secondary | ICD-10-CM

## 2022-05-13 ENCOUNTER — Encounter: Payer: Self-pay | Admitting: Internal Medicine

## 2022-05-13 ENCOUNTER — Ambulatory Visit (INDEPENDENT_AMBULATORY_CARE_PROVIDER_SITE_OTHER): Payer: Federal, State, Local not specified - PPO | Admitting: Internal Medicine

## 2022-05-13 VITALS — BP 148/84 | HR 61 | Ht 67.0 in | Wt 201.0 lb

## 2022-05-13 DIAGNOSIS — F3341 Major depressive disorder, recurrent, in partial remission: Secondary | ICD-10-CM

## 2022-05-13 DIAGNOSIS — E890 Postprocedural hypothyroidism: Secondary | ICD-10-CM

## 2022-05-13 DIAGNOSIS — I10 Essential (primary) hypertension: Secondary | ICD-10-CM | POA: Diagnosis not present

## 2022-05-13 DIAGNOSIS — E669 Obesity, unspecified: Secondary | ICD-10-CM | POA: Diagnosis not present

## 2022-05-13 DIAGNOSIS — Z23 Encounter for immunization: Secondary | ICD-10-CM

## 2022-05-13 MED ORDER — LOSARTAN POTASSIUM 50 MG PO TABS: 50.0000 mg | ORAL_TABLET | Freq: Every day | ORAL | 1 refills | Status: DC

## 2022-05-13 NOTE — Assessment & Plan Note (Addendum)
BP Readings from Last 1 Encounters:  05/13/22 (!) 148/84   Uncontrolled with losartan 25 mg QD Home BP readings mostly in 130s/80s, she has headache episodes as well Increased dose of Losartan to 50 mg QD Advised DASH diet and moderate exercise/walking, at least 150 mins/week Advised to check BP at home and contact if BP > 140/90 persistently

## 2022-05-13 NOTE — Addendum Note (Signed)
Addended byIhor Dow on: 05/13/2022 01:27 PM   Modules accepted: Level of Service

## 2022-05-13 NOTE — Progress Notes (Signed)
Established Patient Office Visit  Subjective:  Patient ID: Molly Knight, female    DOB: 07-14-1955  Age: 67 y.o. MRN: [id number]  CC:  Chief Complaint  Patient presents with   Hypertension    Patient is having a four month follow up on hypertension    HPI Molly Knight is a 67 y.o. female with past medical history of postoperative hypothyroidism, hot flashes and depression who presents for f/u of her chronic medical conditions.  BP is slightly elevated today. She has brought home BP readings, which show around 130s/80s mostly, but some SBP readings above 140. She c/o generalized headaches sometimes, but less frequently now. Patient denies dizziness, chest pain, dyspnea or palpitations.  Hypothyroidism: She takes levothyroxine 75 mcg QD.  She denies chronic fatigue, recent change in weight or appetite, tremors or palpitations.  She has been taking Prozac, but has stopped estradiol patch for hot flashes. She has mild hot flashes, but is manageable for now.  She had left sided acute otitis media, treated with oral Augmentin.  She has history of perforated tympanic membrane, which required surgery.  Her ear drainage has resolved now, but still has mild left sided ear discomfort.  Denies any fever or chills.  She is planned to see ENT specialist.   Past Medical History:  Diagnosis Date   Depression    Thyroid disease    Thyroid nodule 09/27/2015   Last Assessment & Plan:  Formatting of this note might be different from the original. Stable.  Her left lobe is normal in size on physical exam with no palpable nodule.    Past Surgical History:  Procedure Laterality Date   ABDOMINAL HYSTERECTOMY  2003   bladder tach     CESAREAN SECTION     SHOULDER SURGERY Bilateral 02/2019   THYROIDECTOMY, PARTIAL  10/2016    Family History  Problem Relation Age of Onset   Aortic aneurysm Mother    Renal Disease Father    Colon cancer Sister    Throat cancer Brother    Heart attack  Sister    Other Sister        tahycardia   Other Brother        heart valve surgery    Social History   Socioeconomic History   Marital status: Married    Spouse name: Not on file   Number of children: Not on file   Years of education: Not on file   Highest education level: Not on file  Occupational History   Not on file  Tobacco Use   Smoking status: Never   Smokeless tobacco: Never  Vaping Use   Vaping Use: Never used  Substance and Sexual Activity   Alcohol use: Not Currently   Drug use: Never   Sexual activity: Not Currently    Birth control/protection: Surgical    Comment: hyst  Other Topics Concern   Not on file  Social History Narrative   Not on file   Social Determinants of Health   Financial Resource Strain: Low Risk  (08/27/2020)   Overall Financial Resource Strain (CARDIA)    Difficulty of Paying Living Expenses: Not hard at all  Food Insecurity: No Food Insecurity (08/27/2020)   Hunger Vital Sign    Worried About Running Out of Food in the Last Year: Never true    [place] in the Last Year: Never true  Transportation Needs: No Transportation Needs (08/27/2020)   PRAPARE - Transportation    Lack of Transportation (  Medical): No    Lack of Transportation (Non-Medical): No  Physical Activity: Insufficiently Active (08/27/2020)   Exercise Vital Sign    Days of Exercise per Week: 3 days    Minutes of Exercise per Session: 30 min  Stress: No Stress Concern Present (08/27/2020)   [employer] of Occupational Health - Occupational Stress Questionnaire    Feeling of Stress : Not at all  Social Connections: Moderately Integrated (08/27/2020)   Social Connection and Isolation Panel [NHANES]    Frequency of Communication with Friends and Family: Once a week    Frequency of Social Gatherings with Friends and Family: More than three times a week    Attends Religious Services: 1 to 4 times per year    Active Member of [employer] or Organizations: No    Attends Occupational hygienist Meetings: Never    Marital Status: Married  Catering manager Violence: Not At Risk (08/27/2020)   Humiliation, Afraid, Rape, and Kick questionnaire    Fear of Current or Ex-Partner: No    Emotionally Abused: No    Physically Abused: No    Sexually Abused: No    Outpatient Medications Prior to Visit  Medication Sig Dispense Refill   CALCIUM PO Take by mouth.     cetirizine (ZYRTEC) 10 MG tablet Take 10 mg by mouth daily.     FLUoxetine (PROZAC) 20 MG capsule TAKE 1 CAPSULE(20 MG) BY MOUTH DAILY 30 capsule 3   levothyroxine (SYNTHROID) 75 MCG tablet TAKE 1 TABLET(75 MCG) BY MOUTH DAILY BEFORE BREAKFAST 90 tablet 1   MAGNESIUM PO Take by mouth.     meclizine (ANTIVERT) 25 MG tablet Take 1 tablet (25 mg total) by mouth 3 (three) times daily as needed for dizziness. 30 tablet 1   metroNIDAZOLE (METROGEL) 1 % gel SMARTSIG:1 Topical Every Night     Multiple Vitamins-Minerals (MULTI VITAMIN/MINERALS) TABS Take 1 tablet by mouth daily.     VITAMIN D PO Take by mouth.     amoxicillin-clavulanate (AUGMENTIN) 875-125 MG tablet Take 1 tablet by mouth 2 (two) times daily. 20 tablet 0   losartan (COZAAR) 25 MG tablet Take 1 tablet (25 mg total) by mouth daily. 30 tablet 3   No facility-administered medications prior to visit.    No Known Allergies  ROS Review of Systems  Constitutional:  Negative for chills and fever.  HENT:  Positive for ear pain. Negative for congestion, ear discharge, sinus pressure, sinus pain and sore throat.   Eyes:  Negative for pain and discharge.  Respiratory:  Negative for cough and shortness of breath.   Cardiovascular:  Negative for chest pain and palpitations.  Gastrointestinal:  Negative for abdominal pain, diarrhea, nausea and vomiting.  Endocrine: Negative for polydipsia and polyuria.  Genitourinary:  Negative for dysuria and hematuria.  Musculoskeletal:  Positive for arthralgias and back pain. Negative for neck pain and neck stiffness.  Skin:   Negative for rash.  Neurological:  Positive for headaches. Negative for dizziness and weakness.  Psychiatric/Behavioral:  Negative for agitation and behavioral problems.       Objective:    Physical Exam Constitutional:      General: She is not in acute distress.    Appearance: She is not diaphoretic.  HENT:     Head: Normocephalic and atraumatic.     Ears:     Comments: Ear wax b/l - no impaction, no erythema in ear canal    Nose: No congestion.     Mouth/Throat:  Mouth: Mucous membranes are moist.     Pharynx: No posterior oropharyngeal erythema.  Eyes:     Extraocular Movements: Extraocular movements intact.  Cardiovascular:     Rate and Rhythm: Normal rate and regular rhythm.     Pulses: Normal pulses.     Heart sounds: Normal heart sounds. No murmur heard. Pulmonary:     Breath sounds: Normal breath sounds. No wheezing or rales.  Musculoskeletal:     Right lower leg: No edema.     Left lower leg: No edema.  Skin:    General: Skin is warm.     Findings: No rash.  Neurological:     General: No focal deficit present.     Mental Status: She is alert and oriented to person, place, and time.  Psychiatric:        Mood and Affect: Mood normal.        Behavior: Behavior normal.     BP (!) 148/84 (BP Location: Left Arm, Patient Position: Sitting, Cuff Size: Large)   Pulse 61   Ht 5\' 7"  (1.702 m)   Wt 201 lb (91.2 kg)   LMP  (LMP Unknown)   SpO2 96%   BMI 31.48 kg/m  Wt Readings from Last 3 Encounters:  05/13/22 201 lb (91.2 kg)  04/04/22 202 lb 12.8 oz (92 kg)  01/09/22 195 lb 9.6 oz (88.7 kg)    Lab Results  Component Value Date   TSH 1.920 01/24/2022   Lab Results  Component Value Date   WBC 5.0 06/10/2021   HGB 13.5 06/10/2021   HCT 39.8 06/10/2021   MCV 92 06/10/2021   PLT 272 06/10/2021   Lab Results  Component Value Date   NA 141 01/24/2022   K 4.7 01/24/2022   CO2 22 01/24/2022   GLUCOSE 81 01/24/2022   BUN 16 01/24/2022   CREATININE  0.84 01/24/2022   BILITOT 0.3 06/10/2021   ALKPHOS 80 06/10/2021   AST 19 06/10/2021   ALT 16 06/10/2021   PROT 6.6 06/10/2021   ALBUMIN 4.4 06/10/2021   CALCIUM 9.2 01/24/2022   ANIONGAP 8 12/15/2020   EGFR 78 01/24/2022   Lab Results  Component Value Date   CHOL 240 (H) 06/10/2021   Lab Results  Component Value Date   HDL 79 06/10/2021   Lab Results  Component Value Date   LDLCALC 148 (H) 06/10/2021   Lab Results  Component Value Date   TRIG 79 06/10/2021   Lab Results  Component Value Date   CHOLHDL 3.0 06/10/2021   Lab Results  Component Value Date   HGBA1C 5.7 (H) 06/10/2021      Assessment & Plan:   Problem List Items Addressed This Visit       Cardiovascular and Mediastinum   Essential hypertension - Primary    BP Readings from Last 1 Encounters:  05/13/22 (!) 148/84  Uncontrolled with losartan 25 mg QD Home BP readings mostly in 130s/80s, she has headache episodes as well Increased dose of Losartan to 50 mg QD Advised DASH diet and moderate exercise/walking, at least 150 mins/week Advised to check BP at home and contact if BP > 140/90 persistently      Relevant Medications   losartan (COZAAR) 50 MG tablet     Endocrine   Postoperative hypothyroidism    Due to h/o multinodular goiter Lab Results  Component Value Date   TSH 1.920 01/24/2022  Takes Levothyroxine 75 mcg QD        Other  MDD (major depressive disorder), recurrent, in partial remission ([place])    [place] Office Visit from 04/04/2022 in [facility]  PHQ-9 Total Score 2     Well-controlled On Prozac, now better since moving to a new home Sleep is better as well.      Obesity (BMI 30.0-34.9)    Diet modification advised Moderate exercise and plan for activity modification discussed      Other Visit Diagnoses     Need for pneumococcal 20-valent conjugate vaccination       Relevant Orders   Pneumococcal conjugate vaccine 20-valent  (Completed)      Meds ordered this encounter  Medications   losartan (COZAAR) 50 MG tablet    Sig: Take 1 tablet (50 mg total) by mouth daily.    Dispense:  90 tablet    Refill:  1    Dose change    Follow-up: Return in about 2 months (around 07/12/2022) for Annual physical.    Lindell Spar, MD

## 2022-05-13 NOTE — Patient Instructions (Addendum)
Please start taking Losartan 50 mg once daily.  Please continue taking medications as prescribed.  Please continue to follow low salt diet and ambulate as tolerated.

## 2022-05-13 NOTE — Assessment & Plan Note (Addendum)
[place] Office Visit from 04/04/2022 in [facility]  PHQ-9 Total Score 2      Well-controlled On Prozac, now better since moving to a new home Sleep is better as well.

## 2022-05-13 NOTE — Assessment & Plan Note (Signed)
Due to h/o multinodular goiter Lab Results  Component Value Date   TSH 1.920 01/24/2022   Takes Levothyroxine 75 mcg QD 

## 2022-05-13 NOTE — Assessment & Plan Note (Signed)
Diet modification advised Moderate exercise and plan for activity modification discussed 

## 2022-06-05 DIAGNOSIS — H722X2 Other marginal perforations of tympanic membrane, left ear: Secondary | ICD-10-CM | POA: Diagnosis not present

## 2022-06-07 ENCOUNTER — Other Ambulatory Visit: Payer: Self-pay | Admitting: Internal Medicine

## 2022-06-07 DIAGNOSIS — F320 Major depressive disorder, single episode, mild: Secondary | ICD-10-CM

## 2022-06-07 DIAGNOSIS — E890 Postprocedural hypothyroidism: Secondary | ICD-10-CM

## 2022-06-09 ENCOUNTER — Encounter: Payer: Self-pay | Admitting: Internal Medicine

## 2022-06-10 ENCOUNTER — Ambulatory Visit (INDEPENDENT_AMBULATORY_CARE_PROVIDER_SITE_OTHER): Payer: Federal, State, Local not specified - PPO

## 2022-06-10 ENCOUNTER — Ambulatory Visit (INDEPENDENT_AMBULATORY_CARE_PROVIDER_SITE_OTHER): Payer: Federal, State, Local not specified - PPO | Admitting: Orthopaedic Surgery

## 2022-06-10 ENCOUNTER — Encounter: Payer: Self-pay | Admitting: Orthopaedic Surgery

## 2022-06-10 VITALS — BP 167/85 | HR 61 | Ht 67.0 in | Wt 200.0 lb

## 2022-06-10 DIAGNOSIS — M25511 Pain in right shoulder: Secondary | ICD-10-CM

## 2022-06-10 DIAGNOSIS — G8929 Other chronic pain: Secondary | ICD-10-CM | POA: Diagnosis not present

## 2022-06-10 NOTE — Progress Notes (Signed)
My right shoulder is hurting.  She has pain in the right shoulder getting worse over the last six weeks or so.  She has no trauma.  She had shoulder surgery about 20 years ago and did well until recently.  She has pain with overhead use and laying on it at night.  She has no numbness or redness. Tylenol helps some as does heat or ice.  She has no neck problem  ROM of the right shoulder is good with pain in the extremes.  NV intact.  ROM of neck is full.  Encounter Diagnosis  Name Primary?   Chronic right shoulder pain Yes   X-rays were done of the right shoulder, reported separately.  PROCEDURE NOTE:  The patient request injection, verbal consent was obtained.  The right shoulder was prepped appropriately after time out was performed.   Sterile technique was observed and injection of 1 cc of DepoMedrol 65m with several cc's of plain xylocaine. Anesthesia was provided by ethyl chloride and a 20-gauge needle was used to inject the shoulder area. A posterior approach was used.  The injection was tolerated well.  A band aid dressing was applied.  The patient was advised to apply ice later today and tomorrow to the injection sight as needed.  Return in two weeks.  She may need MRI of the shoulder.  Call if any problem.  Precautions discussed.  Electronically Signed WSanjuana Kava MD 2/20/20249:19 AM

## 2022-06-18 DIAGNOSIS — K080 Exfoliation of teeth due to systemic causes: Secondary | ICD-10-CM | POA: Diagnosis not present

## 2022-06-18 DIAGNOSIS — K08401 Partial loss of teeth, unspecified cause, class I: Secondary | ICD-10-CM | POA: Diagnosis not present

## 2022-06-19 ENCOUNTER — Encounter: Payer: Self-pay | Admitting: Radiology

## 2022-06-24 ENCOUNTER — Ambulatory Visit: Payer: Medicare Other | Admitting: Orthopaedic Surgery

## 2022-07-01 ENCOUNTER — Other Ambulatory Visit: Payer: Self-pay | Admitting: Obstetrics & Gynecology

## 2022-07-08 ENCOUNTER — Encounter: Payer: Self-pay | Admitting: Orthopaedic Surgery

## 2022-07-08 ENCOUNTER — Ambulatory Visit (INDEPENDENT_AMBULATORY_CARE_PROVIDER_SITE_OTHER): Payer: Federal, State, Local not specified - PPO | Admitting: Orthopaedic Surgery

## 2022-07-08 VITALS — BP 158/81 | HR 61 | Ht 67.0 in | Wt 200.0 lb

## 2022-07-08 DIAGNOSIS — M26609 Unspecified temporomandibular joint disorder, unspecified side: Secondary | ICD-10-CM | POA: Insufficient documentation

## 2022-07-08 DIAGNOSIS — S46001D Unspecified injury of muscle(s) and tendon(s) of the rotator cuff of right shoulder, subsequent encounter: Secondary | ICD-10-CM | POA: Diagnosis not present

## 2022-07-08 DIAGNOSIS — G8929 Other chronic pain: Secondary | ICD-10-CM

## 2022-07-08 DIAGNOSIS — M25511 Pain in right shoulder: Secondary | ICD-10-CM | POA: Diagnosis not present

## 2022-07-08 NOTE — Progress Notes (Signed)
My shoulder is worse.  She has increased pain in the right shoulder.  She had injection about a month ago that helped several weeks. She has pain with motion and pain sleeping.  She has no new trauma, no numbness or weakness.  ROM of the right shoulder is painful in the extremes and more past abduction 100 and forward of 120.  NV intact.  Encounter Diagnoses  Name Primary?   Chronic right shoulder pain Yes   Rotator cuff injury, right, subsequent encounter    I will get MRI of the right shoulder.  Return in two weeks.  Call if any problem.  Precautions discussed.  Electronically Signed Sanjuana Kava, MD 3/19/20249:57 AM

## 2022-07-08 NOTE — Patient Instructions (Signed)
Central Scheduling [phone]  While we are working on your approval please go ahead and call to schedule your appointment to be done within one week. Once I have gotten the imaging approved for the facility you choose, I can't change it. [facility] is usually booked out for several weeks with imaging appointments.   DR.KEELING'S SCHEDULE IS AS FOLLOWS: TUESDAY: ALL DAY WEDNESDAY: MORNING ONLY THURSDAY: MORNING ONLY PLEASE CALL OR SEND A MESSAGE VIA MYCHART BY WEDNESDAY MORNING SO THAT I CAN SEND A REQUEST TO HIM. HE LEAVES THURSDAY BY 11:30AM. HE DOES NOT RETURN TO THE OFFICE UNTIL TUESDAY MORNINGS AND HE DOES NOT CHECK HIS WORK MESSAGES DURING HIS TIME AWAY FROM THE OFFICE. I'M  AND IF YOU NEED SOMETHING, SEND ME A MYCHART MESSAGE OR CALL. MYCHART IS THE FASTEST WAY TO GET IN CONTACT WITH ME.   

## 2022-07-09 DIAGNOSIS — H9012 Conductive hearing loss, unilateral, left ear, with unrestricted hearing on the contralateral side: Secondary | ICD-10-CM | POA: Diagnosis not present

## 2022-07-12 ENCOUNTER — Encounter: Payer: Self-pay | Admitting: Obstetrics & Gynecology

## 2022-07-12 DIAGNOSIS — R232 Flushing: Secondary | ICD-10-CM

## 2022-07-14 ENCOUNTER — Other Ambulatory Visit: Payer: Self-pay

## 2022-07-14 ENCOUNTER — Telehealth: Payer: Self-pay | Admitting: Internal Medicine

## 2022-07-14 ENCOUNTER — Encounter: Payer: Self-pay | Admitting: Internal Medicine

## 2022-07-14 ENCOUNTER — Ambulatory Visit (HOSPITAL_COMMUNITY)
Admission: RE | Admit: 2022-07-14 | Discharge: 2022-07-14 | Disposition: A | Discharge status: Home / Self Care | Payer: Federal, State, Local not specified - PPO | Source: Ambulatory Visit | Attending: Orthopaedic Surgery | Admitting: Orthopaedic Surgery

## 2022-07-14 ENCOUNTER — Ambulatory Visit (INDEPENDENT_AMBULATORY_CARE_PROVIDER_SITE_OTHER): Payer: Federal, State, Local not specified - PPO | Admitting: Internal Medicine

## 2022-07-14 VITALS — BP 137/75 | HR 61 | Ht 67.0 in | Wt 202.6 lb

## 2022-07-14 DIAGNOSIS — E669 Obesity, unspecified: Secondary | ICD-10-CM | POA: Diagnosis not present

## 2022-07-14 DIAGNOSIS — S46001D Unspecified injury of muscle(s) and tendon(s) of the rotator cuff of right shoulder, subsequent encounter: Secondary | ICD-10-CM | POA: Insufficient documentation

## 2022-07-14 DIAGNOSIS — E890 Postprocedural hypothyroidism: Secondary | ICD-10-CM | POA: Diagnosis not present

## 2022-07-14 DIAGNOSIS — I10 Essential (primary) hypertension: Secondary | ICD-10-CM | POA: Diagnosis not present

## 2022-07-14 DIAGNOSIS — M25511 Pain in right shoulder: Secondary | ICD-10-CM | POA: Insufficient documentation

## 2022-07-14 DIAGNOSIS — R7303 Prediabetes: Secondary | ICD-10-CM

## 2022-07-14 DIAGNOSIS — M67813 Other specified disorders of tendon, right shoulder: Secondary | ICD-10-CM | POA: Diagnosis not present

## 2022-07-14 DIAGNOSIS — E782 Mixed hyperlipidemia: Secondary | ICD-10-CM

## 2022-07-14 DIAGNOSIS — F3341 Major depressive disorder, recurrent, in partial remission: Secondary | ICD-10-CM

## 2022-07-14 DIAGNOSIS — G8929 Other chronic pain: Secondary | ICD-10-CM | POA: Diagnosis not present

## 2022-07-14 DIAGNOSIS — Z0001 Encounter for general adult medical examination with abnormal findings: Secondary | ICD-10-CM | POA: Diagnosis not present

## 2022-07-14 DIAGNOSIS — N959 Unspecified menopausal and perimenopausal disorder: Secondary | ICD-10-CM

## 2022-07-14 DIAGNOSIS — H722X2 Other marginal perforations of tympanic membrane, left ear: Secondary | ICD-10-CM

## 2022-07-14 DIAGNOSIS — Z13820 Encounter for screening for osteoporosis: Secondary | ICD-10-CM

## 2022-07-14 DIAGNOSIS — E559 Vitamin D deficiency, unspecified: Secondary | ICD-10-CM

## 2022-07-14 DIAGNOSIS — M19011 Primary osteoarthritis, right shoulder: Secondary | ICD-10-CM | POA: Diagnosis not present

## 2022-07-14 DIAGNOSIS — R232 Flushing: Secondary | ICD-10-CM

## 2022-07-14 MED ORDER — ESTRADIOL 0.05 MG/24HR TD PTWK: 0.0500 mg | MEDICATED_PATCH | TRANSDERMAL | 12 refills | Status: DC

## 2022-07-14 NOTE — Telephone Encounter (Signed)
Can you please change the diagnosis on the DEXA to premenopausal?

## 2022-07-14 NOTE — Progress Notes (Signed)
Established Patient Office Visit  Subjective:  Patient ID: Molly Knight, female    DOB: 11/08/1955  Age: 67 y.o. MRN: [id number]  CC:  Chief Complaint  Patient presents with   Annual Exam    HPI Molly Knight is a 67 y.o. female with past medical history of postoperative hypothyroidism, hot flashes and depression who presents for annual physical.  She has been taking Prozac and uses estradiol patch for hot flashes. She feels down or depressed and has fatigue on the cloudy days.  Denies any SI or HI currently.  BP is well-controlled. Patient denies headache, dizziness, chest pain, dyspnea or palpitations.    Past Medical History:  Diagnosis Date   Depression    Thyroid disease    Thyroid nodule 09/27/2015   Last Assessment & Plan:  Formatting of this note might be different from the original. Stable.  Her left lobe is normal in size on physical exam with no palpable nodule.    Past Surgical History:  Procedure Laterality Date   ABDOMINAL HYSTERECTOMY  2003   bladder tach     CESAREAN SECTION     SHOULDER SURGERY Bilateral 02/2019   THYROIDECTOMY, PARTIAL  10/2016    Family History  Problem Relation Age of Onset   Aortic aneurysm Mother    Renal Disease Father    Colon cancer Sister    Throat cancer Brother    Heart attack Sister    Other Sister        tahycardia   Other Brother        heart valve surgery    Social History   Socioeconomic History   Marital status: Married    Spouse name: Not on file   Number of children: Not on file   Years of education: Not on file   Highest education level: Not on file  Occupational History   Not on file  Tobacco Use   Smoking status: Never   Smokeless tobacco: Never  Vaping Use   Vaping Use: Never used  Substance and Sexual Activity   Alcohol use: Not Currently   Drug use: Never   Sexual activity: Not Currently    Birth control/protection: Surgical    Comment: hyst  Other Topics Concern   Not on  file  Social History Narrative   Not on file   Social Determinants of Health   Financial Resource Strain: Low Risk  (08/27/2020)   Overall Financial Resource Strain (CARDIA)    Difficulty of Paying Living Expenses: Not hard at all  Food Insecurity: No Food Insecurity (08/27/2020)   Hunger Vital Sign    Worried About Running Out of Food in the Last Year: Never true    [place] in the Last Year: Never true  Transportation Needs: No Transportation Needs (08/27/2020)   PRAPARE - Hydrologist (Medical): No    Lack of Transportation (Non-Medical): No  Physical Activity: Insufficiently Active (08/27/2020)   Exercise Vital Sign    Days of Exercise per Week: 3 days    Minutes of Exercise per Session: 30 min  Stress: No Stress Concern Present (08/27/2020)   [place]    Feeling of Stress : Not at all  Social Connections: Moderately Integrated (08/27/2020)   Social Connection and Isolation Panel [NHANES]    Frequency of Communication with Friends and Family: Once a week    Frequency of Social Gatherings with Friends and Family: More  than three times a week    Attends Religious Services: 1 to 4 times per year    Active Member of Clubs or Organizations: No    Attends Archivist Meetings: Never    Marital Status: Married  Human resources officer Violence: Not At Risk (08/27/2020)   Humiliation, Afraid, Rape, and Kick questionnaire    Fear of Current or Ex-Partner: No    Emotionally Abused: No    Physically Abused: No    Sexually Abused: No    Outpatient Medications Prior to Visit  Medication Sig Dispense Refill   CALCIUM PO Take by mouth.     cetirizine (ZYRTEC) 10 MG tablet Take 10 mg by mouth daily.     estradiol (CLIMARA - DOSED IN MG/24 HR) 0.025 mg/24hr patch Place onto the skin once a week. Uknown dose     FLUoxetine (PROZAC) 20 MG capsule TAKE 1 CAPSULE(20 MG) BY MOUTH DAILY 30 capsule 3    levothyroxine (SYNTHROID) 75 MCG tablet TAKE 1 TABLET(75 MCG) BY MOUTH DAILY BEFORE BREAKFAST 90 tablet 1   losartan (COZAAR) 50 MG tablet Take 1 tablet (50 mg total) by mouth daily. 90 tablet 1   MAGNESIUM PO Take by mouth.     meclizine (ANTIVERT) 25 MG tablet Take 1 tablet (25 mg total) by mouth 3 (three) times daily as needed for dizziness. 30 tablet 1   metroNIDAZOLE (METROGEL) 1 % gel SMARTSIG:1 Topical Every Night     Multiple Vitamins-Minerals (MULTI VITAMIN/MINERALS) TABS Take 1 tablet by mouth daily.     VITAMIN D PO Take by mouth.     No facility-administered medications prior to visit.    No Known Allergies  ROS Review of Systems  Constitutional:  Positive for diaphoresis. Negative for chills and fever.  HENT:  Negative for congestion, sinus pressure, sinus pain and sore throat.   Eyes:  Negative for pain and discharge.  Respiratory:  Negative for cough and shortness of breath.   Cardiovascular:  Negative for chest pain and palpitations.  Gastrointestinal:  Negative for abdominal pain, diarrhea, nausea and vomiting.  Endocrine: Positive for heat intolerance. Negative for polydipsia and polyuria.  Genitourinary:  Negative for dysuria and hematuria.  Musculoskeletal:  Positive for arthralgias and back pain. Negative for neck pain and neck stiffness.  Skin:  Negative for rash.  Neurological:  Negative for dizziness and weakness.  Psychiatric/Behavioral:  Negative for agitation and behavioral problems.       Objective:    Physical Exam Vitals reviewed.  Constitutional:      General: She is not in acute distress.    Appearance: She is not diaphoretic.  HENT:     Mouth/Throat:     Mouth: Mucous membranes are moist.     Pharynx: No posterior oropharyngeal erythema.  Eyes:     General: No scleral icterus.    Extraocular Movements: Extraocular movements intact.  Cardiovascular:     Rate and Rhythm: Normal rate and regular rhythm.     Pulses: Normal pulses.      Heart sounds: Normal heart sounds. No murmur heard. Pulmonary:     Breath sounds: Normal breath sounds. No wheezing or rales.  Abdominal:     Palpations: Abdomen is soft.     Tenderness: There is no abdominal tenderness.  Musculoskeletal:        General: Tenderness (Lumbar spine area) present.     Cervical back: Neck supple. No tenderness.     Right lower leg: No edema.  Left lower leg: No edema.  Skin:    General: Skin is warm.     Findings: No rash.  Neurological:     General: No focal deficit present.     Mental Status: She is alert and oriented to person, place, and time.     Cranial Nerves: No cranial nerve deficit.     Sensory: No sensory deficit.     Motor: No weakness.  Psychiatric:        Mood and Affect: Mood normal.        Behavior: Behavior normal.     BP 137/75 (BP Location: Right Arm, Patient Position: Sitting, Cuff Size: Large)   Pulse 61   Ht 5\' 7"  (1.702 m)   Wt 202 lb 9.6 oz (91.9 kg)   LMP  (LMP Unknown)   SpO2 98%   BMI 31.73 kg/m  Wt Readings from Last 3 Encounters:  07/14/22 202 lb 9.6 oz (91.9 kg)  07/08/22 200 lb (90.7 kg)  06/10/22 200 lb (90.7 kg)    Lab Results  Component Value Date   TSH 1.920 01/24/2022   Lab Results  Component Value Date   WBC 5.0 06/10/2021   HGB 13.5 06/10/2021   HCT 39.8 06/10/2021   MCV 92 06/10/2021   PLT 272 06/10/2021   Lab Results  Component Value Date   NA 141 01/24/2022   K 4.7 01/24/2022   CO2 22 01/24/2022   GLUCOSE 81 01/24/2022   BUN 16 01/24/2022   CREATININE 0.84 01/24/2022   BILITOT 0.3 06/10/2021   ALKPHOS 80 06/10/2021   AST 19 06/10/2021   ALT 16 06/10/2021   PROT 6.6 06/10/2021   ALBUMIN 4.4 06/10/2021   CALCIUM 9.2 01/24/2022   ANIONGAP 8 12/15/2020   EGFR 78 01/24/2022   Lab Results  Component Value Date   CHOL 240 (H) 06/10/2021   Lab Results  Component Value Date   HDL 79 06/10/2021   Lab Results  Component Value Date   LDLCALC 148 (H) 06/10/2021   Lab Results   Component Value Date   TRIG 79 06/10/2021   Lab Results  Component Value Date   CHOLHDL 3.0 06/10/2021   Lab Results  Component Value Date   HGBA1C 5.7 (H) 06/10/2021      Assessment & Plan:   Problem List Items Addressed This Visit    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/o multinodular goiter Lab Results  Component Value Date   TSH 1.920 01/24/2022   Takes Levothyroxine 75 mcg QD  Essential hypertension BP Readings from Last 1 Encounters:  07/14/22 137/75   Well-controlled with losartan 50 mg QD Advised DASH diet and moderate exercise/walking, at least 150 mins/week Advised to check BP at home and contact if BP > 140/90 persistently  Obesity (BMI 30.0-34.9) Diet modification advised Moderate exercise and plan for activity modification discussed  MDD (major depressive disorder), recurrent, in partial remission ([place]) [place] Office Visit from 07/14/2022 in [facility]  PHQ-9 Total Score 11      Uncontrolled, but states that she feels more down or depressed on cloudy days On Prozac, was better since moving to a new home - avoid changing dose as it seems like seasonal variation currently  Tympanic membrane perforation, marginal, left Followed by ENT specialist  Encounter for general adult medical examination with abnormal findings Physical exam as documented. Fasting blood tests ordered today.    No orders of the defined types were placed in this encounter.  Follow-up: Return in about 4 months (around 11/13/2022) for HTN and MDD.    Lindell Spar, MD

## 2022-07-14 NOTE — Assessment & Plan Note (Signed)
Due to h/o multinodular goiter Lab Results  Component Value Date   TSH 1.920 01/24/2022   Takes Levothyroxine 75 mcg QD

## 2022-07-14 NOTE — Assessment & Plan Note (Signed)
Physical exam as documented. ?Fasting blood tests ordered today. ?

## 2022-07-14 NOTE — Assessment & Plan Note (Addendum)
[place] Office Visit from 07/14/2022 in [facility]  PHQ-9 Total Score 11      Uncontrolled, but states that she feels more down or depressed on cloudy days On Prozac, was better since moving to a new home - avoid changing dose as it seems like seasonal variation currently

## 2022-07-14 NOTE — Assessment & Plan Note (Signed)
BP Readings from Last 1 Encounters:  07/14/22 137/75   Well-controlled with losartan 50 mg QD Advised DASH diet and moderate exercise/walking, at least 150 mins/week Advised to check BP at home and contact if BP > 140/90 persistently

## 2022-07-14 NOTE — Assessment & Plan Note (Signed)
Diet modification advised Moderate exercise and plan for activity modification discussed

## 2022-07-14 NOTE — Addendum Note (Signed)
Addended byIhor Dow on: 07/14/2022 05:03 PM   Modules accepted: Orders

## 2022-07-14 NOTE — Assessment & Plan Note (Signed)
Followed by ENT specialist

## 2022-07-14 NOTE — Patient Instructions (Signed)
Please continue to take medications as prescribed. ? ?Please continue to follow low carb diet and perform moderate exercise/walking at least 150 mins/week. ? ?Please get fasting blood tests done before the next visit. ?

## 2022-07-14 NOTE — Telephone Encounter (Signed)
Can you please put in order for DEXA?

## 2022-07-16 ENCOUNTER — Encounter: Payer: Self-pay | Admitting: Orthopaedic Surgery

## 2022-07-16 ENCOUNTER — Ambulatory Visit (INDEPENDENT_AMBULATORY_CARE_PROVIDER_SITE_OTHER): Payer: Federal, State, Local not specified - PPO | Admitting: Orthopaedic Surgery

## 2022-07-16 DIAGNOSIS — M25511 Pain in right shoulder: Secondary | ICD-10-CM

## 2022-07-16 DIAGNOSIS — G8929 Other chronic pain: Secondary | ICD-10-CM | POA: Diagnosis not present

## 2022-07-16 NOTE — Progress Notes (Signed)
My shoulder is hurting with the rain today.  She had MRI of the right shoulder and it showed:   IMPRESSION: Severe tendinosis with low-grade interstitial and bursal sided tearing of the anterior supraspinatus tendon at the footprint. Severe distal infraspinatus tendinosis with bursal sided fraying. Mild tendinosis of the distal subscapularis tendon with articular sided fraying of the mid fibers. No high-grade or retracted cuff tear. No significant muscle atrophy.   Moderate intra-articular long head biceps tendinosis.   Moderate glenohumeral and AC joint osteoarthritis.   Diffuse degenerative labral fraying.  I have explained the findings to her.  I will have Dr. Amedeo Kinsman review and get his recommendations.  She is hurting most of the time.  She had prior shoulder surgery about 20 years ago.  Right shoulder is tender, no effusion, ROM good but pain in the extremes.  NV intact.  Encounter Diagnosis  Name Primary?   Chronic right shoulder pain Yes   To see Dr. Amedeo Kinsman.  Continue present ibuprofen.  I have independently reviewed the MRI.    Call if any problem.  Precautions discussed.  Electronically Signed Molly Kava, MD 3/27/20248:25 AM

## 2022-07-17 ENCOUNTER — Ambulatory Visit: Payer: Medicare Other | Admitting: Orthopaedic Surgery

## 2022-07-17 DIAGNOSIS — D225 Melanocytic nevi of trunk: Secondary | ICD-10-CM | POA: Diagnosis not present

## 2022-07-17 DIAGNOSIS — L718 Other rosacea: Secondary | ICD-10-CM | POA: Diagnosis not present

## 2022-07-17 DIAGNOSIS — L858 Other specified epidermal thickening: Secondary | ICD-10-CM | POA: Diagnosis not present

## 2022-07-17 DIAGNOSIS — Z1283 Encounter for screening for malignant neoplasm of skin: Secondary | ICD-10-CM | POA: Diagnosis not present

## 2022-07-18 DIAGNOSIS — H9012 Conductive hearing loss, unilateral, left ear, with unrestricted hearing on the contralateral side: Secondary | ICD-10-CM | POA: Insufficient documentation

## 2022-07-18 DIAGNOSIS — H7292 Unspecified perforation of tympanic membrane, left ear: Secondary | ICD-10-CM | POA: Diagnosis not present

## 2022-07-23 ENCOUNTER — Ambulatory Visit: Payer: Federal, State, Local not specified - PPO | Admitting: Orthopedic Surgery

## 2022-07-23 ENCOUNTER — Encounter: Payer: Self-pay | Admitting: Orthopedic Surgery

## 2022-07-23 VITALS — BP 134/81 | HR 78 | Ht 67.0 in | Wt 202.0 lb

## 2022-07-23 DIAGNOSIS — S46001D Unspecified injury of muscle(s) and tendon(s) of the rotator cuff of right shoulder, subsequent encounter: Secondary | ICD-10-CM | POA: Diagnosis not present

## 2022-07-23 DIAGNOSIS — M25511 Pain in right shoulder: Secondary | ICD-10-CM | POA: Diagnosis not present

## 2022-07-23 DIAGNOSIS — G8929 Other chronic pain: Secondary | ICD-10-CM | POA: Diagnosis not present

## 2022-07-23 NOTE — Patient Instructions (Signed)

## 2022-07-24 IMAGING — DX DG LUMBAR SPINE COMPLETE 4+V
5 series · 5 of 5 positions shown · non-contrast
Comparison: None.

CLINICAL DATA: Low back pain, no known injury, initial encounter

EXAM:
LUMBAR SPINE - COMPLETE 4+ VIEW

[l-spine ap]
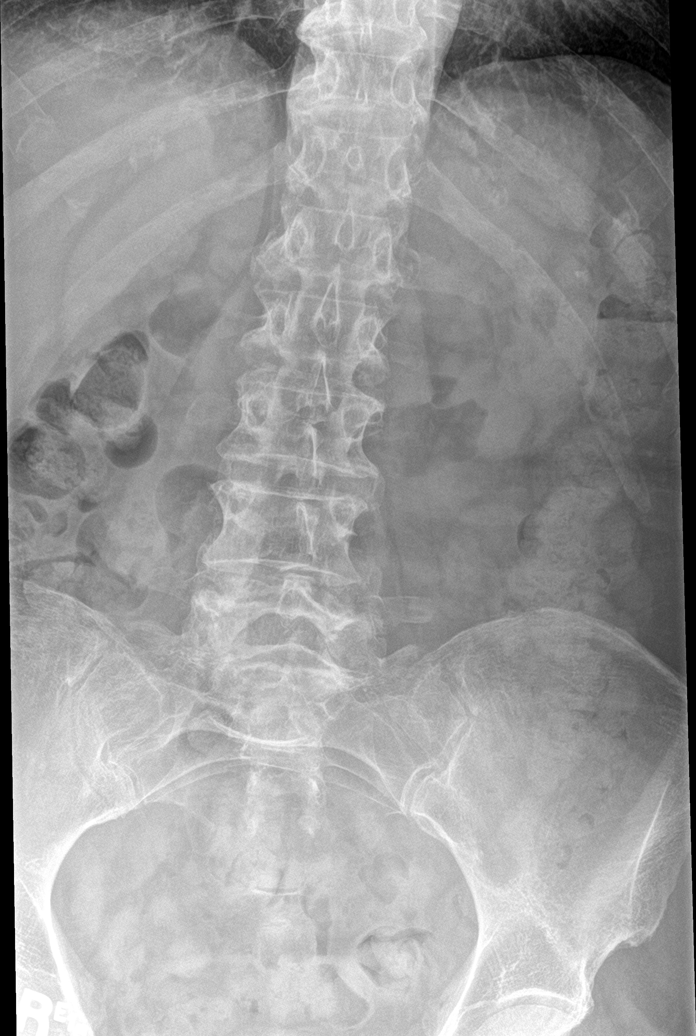

[l-spine obl (1 of 2)]
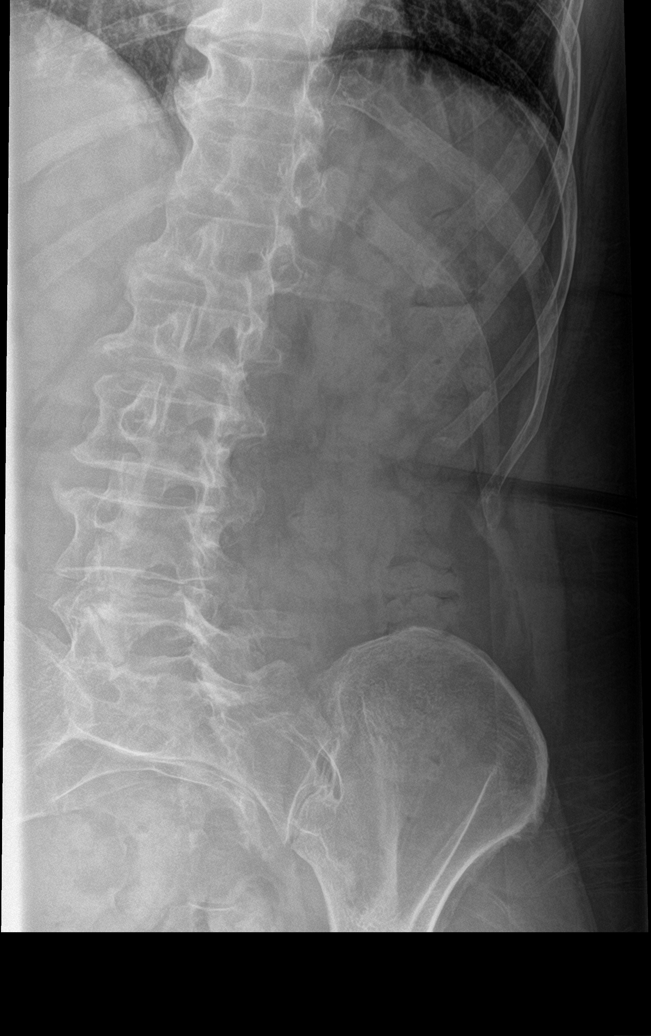

[l-spine obl (2 of 2)]
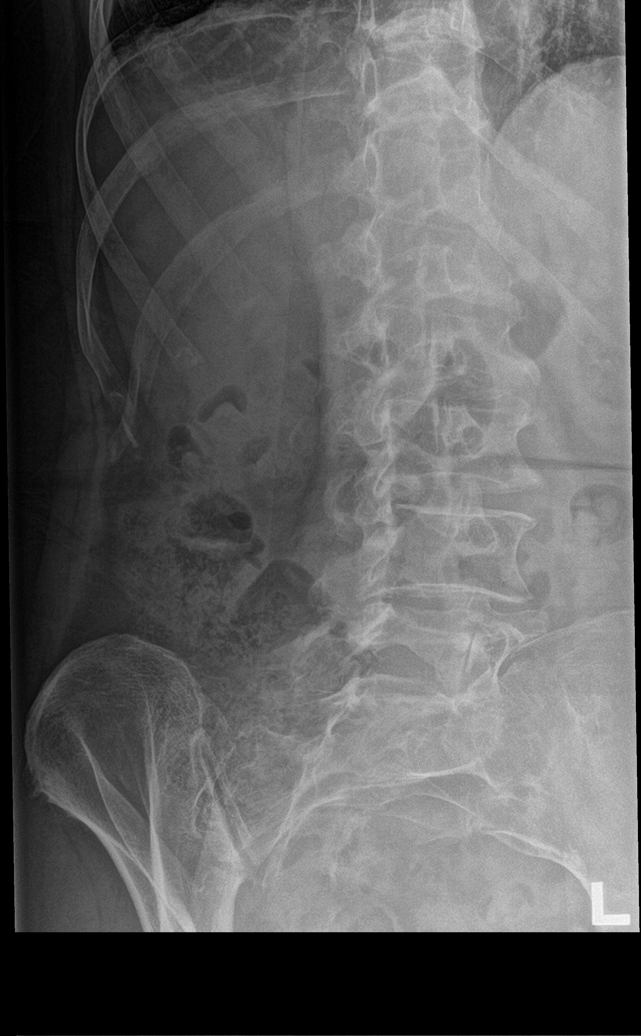

[l-spine lat]
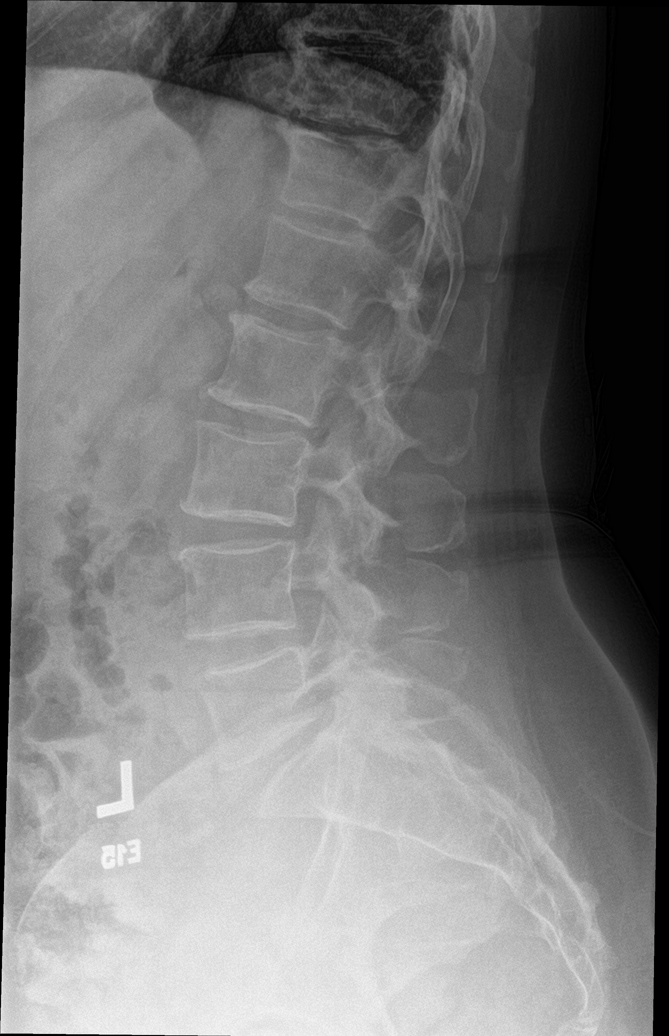

[l-spine spot]
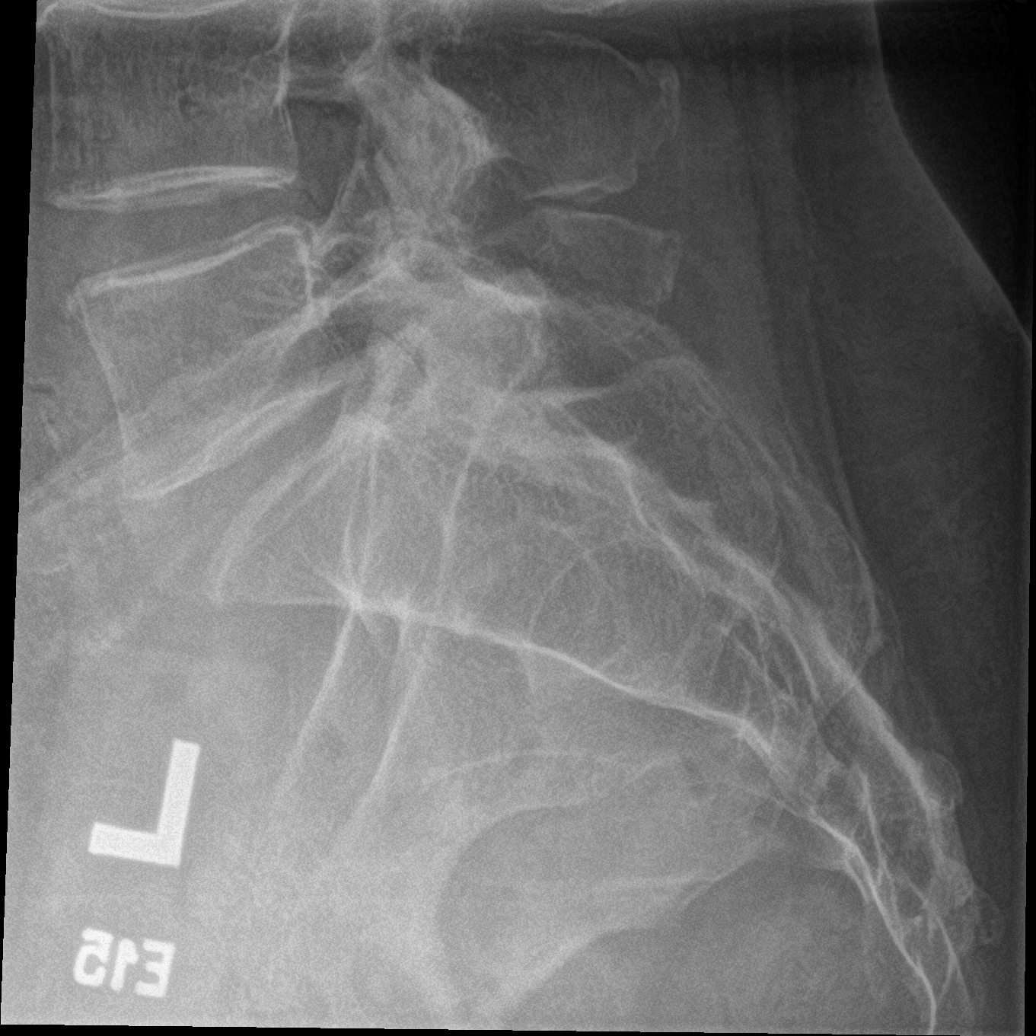

[5 of 5 positions shown; findings below may reference images not displayed]

FINDINGS: Vertebral body height is well maintained. Multilevel osteophytic
changes and facet hypertrophic changes are seen. No anterolisthesis
is noted. No acute fracture is seen. No soft tissue abnormality is
noted.
IMPRESSION: Degenerative changes without acute abnormality.

## 2022-07-25 NOTE — Progress Notes (Signed)
New Patient Visit  Assessment: Molly Knight is a 67 y.o. female with the following: Right shoulder pain  Plan: Molly EeJacqueline Breeland has pain in her right shoulder.  Remote history of rotator cuff repair.  MRI with tendinosis, partial-thickness injury.  She is not interested in surgery.  I am not convinced that surgery could be effective at this time.  As result, I recommended proceeding with a right shoulder glenohumeral joint injection, as well as a biceps tendon sheath injection, both to be completed using ultrasound.  She will pay attention to how this helps with her pain.  If we go through these injections, she has no improvement, we could potentially discuss surgery.  She will follow-up as needed.  Procedure note injection - Right shoulder, ultrasound guidance   Verbal consent was obtained to inject the Right shoulder, glenohumeral joint  Timeout was completed to confirm the site of injection.   Using the ultrasound, the rotator cuff tendons were identified.  The joint space was also identified. The skin was prepped with alcohol and ethyl chloride was sprayed at the injection site.  A 21-gauge needle was used to inject 40 mg of Depo-Medrol and 1% lidocaine (4 cc) into the glenohumeral joint space of the Right shoulder using a posterolateral approach.  The needle was visualized entering the glenohumeral joint, and the medication was also visualized. There were no complications.  A sterile bandage was applied.   Note: In order to accurately identify the placement of the needle, ultrasound was required, to increase the accuracy, and specificity of the injection.   Procedure note injection - Right proximal biceps tendon sheath, ultrasound guidance   Verbal consent was obtained to inject the Right shoulder, biceps tendon sheath Timeout was completed to confirm the site of injection.   Using the ultrasound, the long head of the biceps was identified.   The skin was prepped with alcohol  and ethyl chloride was sprayed at the injection site.  A 21-gauge needle was used to inject 40 mg of Depo-Medrol and 1% lidocaine (1 cc) into the tendon sheath around the long head of the biceps of the Right shoulder using a direct anterior approach.  The needle was visualized entering the tendon sheath, and the injections was also visualized. There were no complications.  A sterile bandage was applied.   Note: In order to accurately identify the placement of the needle, ultrasound was required, to increase the accuracy, and specificity of the injection.     Follow-up: Return if symptoms worsen or fail to improve.  Subjective:  Chief Complaint  Patient presents with   Shoulder Pain    Right surgical consult per Dr Hilda LiasKeeling     History of Present Illness: Molly EeJacqueline Bayman is a 67 y.o. female who presents for evaluation of right shoulder pain.  This is been ongoing for several months.  She does have remote history of right shoulder rotator cuff repair.  She did well following the surgery.  She states that she now has pain in the anterior aspect of the shoulder.  In addition, she has some pain radiating laterally.  She has good range of motion, but notes pain with overhead motion.  Medications have not been effective.  She has had a subacromial steroid injection, which did provide some relief, but the pain returned.  She has been evaluated by Dr. Hilda LiasKeeling, and is here to discuss the findings of her recent MRI.   Review of Systems: No fevers or chills No numbness or tingling No chest  pain No shortness of breath No bowel or bladder dysfunction No GI distress No headaches   Medical History:  Past Medical History:  Diagnosis Date   Depression    Thyroid disease    Thyroid nodule 09/27/2015   Last Assessment & Plan:  Formatting of this note might be different from the original. Stable.  Her left lobe is normal in size on physical exam with no palpable nodule.    Past Surgical  History:  Procedure Laterality Date   ABDOMINAL HYSTERECTOMY  2003   bladder tach     CESAREAN SECTION     SHOULDER SURGERY Bilateral 02/2019   THYROIDECTOMY, PARTIAL  10/2016    Family History  Problem Relation Age of Onset   Aortic aneurysm Mother    Renal Disease Father    Colon cancer Sister    Throat cancer Brother    Heart attack Sister    Other Sister        tahycardia   Other Brother        heart valve surgery   Social History   Tobacco Use   Smoking status: Never   Smokeless tobacco: Never  Vaping Use   Vaping Use: Never used  Substance Use Topics   Alcohol use: Not Currently   Drug use: Never    No Known Allergies  Current Meds  Medication Sig   CALCIUM PO Take by mouth.   cetirizine (ZYRTEC) 10 MG tablet Take 10 mg by mouth daily.   estradiol (CLIMARA - DOSED IN MG/24 HR) 0.05 mg/24hr patch Place 1 patch (0.05 mg total) onto the skin once a week.   FLUoxetine (PROZAC) 20 MG capsule TAKE 1 CAPSULE(20 MG) BY MOUTH DAILY   levothyroxine (SYNTHROID) 75 MCG tablet TAKE 1 TABLET(75 MCG) BY MOUTH DAILY BEFORE BREAKFAST   losartan (COZAAR) 50 MG tablet Take 1 tablet (50 mg total) by mouth daily.   MAGNESIUM PO Take by mouth.   meclizine (ANTIVERT) 25 MG tablet Take 1 tablet (25 mg total) by mouth 3 (three) times daily as needed for dizziness.   metroNIDAZOLE (METROGEL) 1 % gel SMARTSIG:1 Topical Every Night   Multiple Vitamins-Minerals (MULTI VITAMIN/MINERALS) TABS Take 1 tablet by mouth daily.   VITAMIN D PO Take by mouth.    Objective: BP 134/81   Pulse 78   Ht 5\' 7"  (1.702 m)   Wt 202 lb (91.6 kg)   LMP  (LMP Unknown)   BMI 31.64 kg/m   Physical Exam:  General: Alert and oriented. and No acute distress. Gait: Normal gait.  Shoulder with well-healed surgical incisions.  Active forward flexion 150 degrees, with some pain.  Abduction to 90 degrees.  Positive Jobe's.  Positive O'Brien's.  Tenderness to palpation over the anterior shoulder.  No belly  press.  IMAGING: I personally reviewed images previously obtained in clinic   IMPRESSION: Severe tendinosis with low-grade interstitial and bursal sided tearing of the anterior supraspinatus tendon at the footprint. Severe distal infraspinatus tendinosis with bursal sided fraying. Mild tendinosis of the distal subscapularis tendon with articular sided fraying of the mid fibers. No high-grade or retracted cuff tear. No significant muscle atrophy.   Moderate intra-articular long head biceps tendinosis.   Moderate glenohumeral and AC joint osteoarthritis.   Diffuse degenerative labral fraying.  New Medications:  No orders of the defined types were placed in this encounter.     Oliver Barre, MD  07/25/2022 1:58 PM

## 2022-07-31 ENCOUNTER — Other Ambulatory Visit (HOSPITAL_COMMUNITY): Payer: Self-pay | Admitting: Obstetrics & Gynecology

## 2022-07-31 DIAGNOSIS — Z1231 Encounter for screening mammogram for malignant neoplasm of breast: Secondary | ICD-10-CM

## 2022-08-04 ENCOUNTER — Ambulatory Visit (HOSPITAL_COMMUNITY)
Admission: RE | Admit: 2022-08-04 | Discharge: 2022-08-04 | Disposition: A | Discharge status: Home / Self Care | Payer: Federal, State, Local not specified - PPO | Source: Ambulatory Visit | Attending: Internal Medicine | Admitting: Internal Medicine

## 2022-08-04 DIAGNOSIS — N959 Unspecified menopausal and perimenopausal disorder: Secondary | ICD-10-CM

## 2022-08-04 DIAGNOSIS — M85852 Other specified disorders of bone density and structure, left thigh: Secondary | ICD-10-CM | POA: Diagnosis not present

## 2022-08-06 ENCOUNTER — Encounter: Payer: Self-pay | Admitting: Internal Medicine

## 2022-08-26 ENCOUNTER — Encounter: Payer: Self-pay | Admitting: Internal Medicine

## 2022-08-26 ENCOUNTER — Other Ambulatory Visit: Payer: Self-pay | Admitting: Internal Medicine

## 2022-08-26 DIAGNOSIS — K219 Gastro-esophageal reflux disease without esophagitis: Secondary | ICD-10-CM

## 2022-08-26 MED ORDER — PANTOPRAZOLE SODIUM 40 MG PO TBEC
40.0000 mg | DELAYED_RELEASE_TABLET | Freq: Every day | ORAL | 3 refills | Status: DC
Start: 2022-08-26 — End: 2022-12-26

## 2022-08-27 ENCOUNTER — Encounter (INDEPENDENT_AMBULATORY_CARE_PROVIDER_SITE_OTHER): Payer: Self-pay | Admitting: *Deleted

## 2022-09-01 ENCOUNTER — Encounter: Payer: Self-pay | Admitting: Internal Medicine

## 2022-09-01 ENCOUNTER — Other Ambulatory Visit: Payer: Self-pay

## 2022-09-01 DIAGNOSIS — E890 Postprocedural hypothyroidism: Secondary | ICD-10-CM

## 2022-09-09 ENCOUNTER — Ambulatory Visit (INDEPENDENT_AMBULATORY_CARE_PROVIDER_SITE_OTHER): Payer: Federal, State, Local not specified - PPO | Admitting: Gastroenterology

## 2022-09-09 ENCOUNTER — Encounter (INDEPENDENT_AMBULATORY_CARE_PROVIDER_SITE_OTHER): Payer: Self-pay | Admitting: Gastroenterology

## 2022-09-09 VITALS — BP 143/82 | HR 58 | Temp 97.9°F | Ht 67.0 in | Wt 205.7 lb

## 2022-09-09 DIAGNOSIS — R1310 Dysphagia, unspecified: Secondary | ICD-10-CM | POA: Diagnosis not present

## 2022-09-09 DIAGNOSIS — K219 Gastro-esophageal reflux disease without esophagitis: Secondary | ICD-10-CM

## 2022-09-09 MED ORDER — FAMOTIDINE 20 MG PO TABS: 20.0000 mg | ORAL_TABLET | Freq: Every day | ORAL | 1 refills | Status: DC

## 2022-09-09 NOTE — H&P (View-Only) (Signed)
Referring Provider: Anabel Halon, MD Primary Care Physician:  Anabel Halon, MD Primary GI Physician: new   Chief Complaint  Patient presents with   Gastroesophageal Reflux    Referred for GERD. Gets a lot of ticking in throat and coughing up stuff. Pills feel like that get stuck. Pcp started her on protonix 40mg  and it has helped.    HPI:   Molly Knight is a 67 y.o. female with past medical history of depression, GERD, hiatal hernia, thyroid disease   Patient presenting today as a new patient for GERD/question of possible Barrett's esophagus.  Patient reports long history of GERD. She notes that symptoms became worse around the end of 2023 or early 2024, she was on otc omeprazole 20mg  daily at that time. She discussed symptoms with her PCP and was started on protonix 40mg  daily. This has helped some but she notes a lot of coughing with swallowing. She feels the need to clear her throat a lot. Prior to starting protonix she felt like she was "gurgling" when she would lay down. Denies sore throat or hoarseness. She has occasional heartburn at times. She is also having some dysphagia starting to occur, more often with pills though sometimes with foods noting she cuts food into very small bites, chews well and eats a lot of softer foods to avoid choking. Denies nausea or vomiting. No rectal bleeding, melena, changes in appetite, weight loss. Last EGD in 2020 with question of short segment Barrett's esophagus though no biopsy report available and patient is unsure of results.    NSAID use: on occasion  Social hx: no etoh or alcohol  Fam hx: sister had CRC, passed at age 44   Last Colonoscopy: 04/2019 [facility]-descending colon polyp, Tubular adenoma  Last Endoscopy: 05/2018 with esophageal dilation-distal esophageal stricture, small sliding hiatal hernia, noted possible short segment Barrett's esophagus but no biopsy available-pt does not remember   Recommendations:  Repeat  TCS 5 years   Past Medical History:  Diagnosis Date   Depression    GERD (gastroesophageal reflux disease)    Hiatal hernia    Thyroid disease    Thyroid nodule 09/27/2015   Last Assessment & Plan:  Formatting of this note might be different from the original. Stable.  Her left lobe is normal in size on physical exam with no palpable nodule.    Past Surgical History:  Procedure Laterality Date   ABDOMINAL HYSTERECTOMY  2003   bladder tach     CESAREAN SECTION     COLONOSCOPY  04/2019   [place] - Dr. Roseanne Reno - repeat Jan 2025 per patient.   SHOULDER SURGERY Bilateral 02/2019   THYROIDECTOMY, PARTIAL  10/2016    Current Outpatient Medications  Medication Sig Dispense Refill   CALCIUM PO Take by mouth.     cetirizine (ZYRTEC) 10 MG tablet Take 10 mg by mouth daily.     FLUoxetine (PROZAC) 20 MG capsule TAKE 1 CAPSULE(20 MG) BY MOUTH DAILY 30 capsule 3   levothyroxine (SYNTHROID) 75 MCG tablet TAKE 1 TABLET(75 MCG) BY MOUTH DAILY BEFORE BREAKFAST 90 tablet 1   losartan (COZAAR) 50 MG tablet Take 1 tablet (50 mg total) by mouth daily. 90 tablet 1   meclizine (ANTIVERT) 25 MG tablet Take 1 tablet (25 mg total) by mouth 3 (three) times daily as needed for dizziness. 30 tablet 1   metroNIDAZOLE (METROGEL) 1 % gel SMARTSIG:1 Topical Every Night     Multiple Vitamins-Minerals (MULTI VITAMIN/MINERALS) TABS  Take 1 tablet by mouth daily.     pantoprazole (PROTONIX) 40 MG tablet Take 1 tablet (40 mg total) by mouth daily. 30 tablet 3   VITAMIN D PO Take by mouth.     No current facility-administered medications for this visit.    Allergies as of 09/09/2022   (No Known Allergies)    Family History  Problem Relation Age of Onset   Aortic aneurysm Mother    Renal Disease Father    Colon cancer Sister    Throat cancer Brother    Heart attack Sister    Other Sister        tahycardia   Other Brother        heart valve surgery    Social History   Socioeconomic History    Marital status: Married    Spouse name: Not on file   Number of children: Not on file   Years of education: Not on file   Highest education level: Not on file  Occupational History   Not on file  Tobacco Use   Smoking status: Never    Passive exposure: Never   Smokeless tobacco: Never  Vaping Use   Vaping Use: Never used  Substance and Sexual Activity   Alcohol use: Not Currently   Drug use: Never   Sexual activity: Not Currently    Birth control/protection: Surgical    Comment: hyst  Other Topics Concern   Not on file  Social History Narrative   Not on file   Social Determinants of Health   Financial Resource Strain: Low Risk  (08/27/2020)   Overall Financial Resource Strain (CARDIA)    Difficulty of Paying Living Expenses: Not hard at all  Food Insecurity: No Food Insecurity (08/27/2020)   Hunger Vital Sign    Worried About Running Out of Food in the Last Year: Never true    Ran Out of Food in the Last Year: Never true  Transportation Needs: No Transportation Needs (08/27/2020)   PRAPARE - Administrator, Civil Service (Medical): No    Lack of Transportation (Non-Medical): No  Physical Activity: Insufficiently Active (08/27/2020)   Exercise Vital Sign    Days of Exercise per Week: 3 days    Minutes of Exercise per Session: 30 min  Stress: No Stress Concern Present (08/27/2020)   [employer] of Occupational Health - Occupational Stress Questionnaire    Feeling of Stress : Not at all  Social Connections: Moderately Integrated (08/27/2020)   Social Connection and Isolation Panel [NHANES]    Frequency of Communication with Friends and Family: Once a week    Frequency of Social Gatherings with Friends and Family: More than three times a week    Attends Religious Services: 1 to 4 times per year    Active Member of [employer] or Organizations: No    Attends Engineer, structural: Never    Marital Status: Married    Review of systems General: negative for  malaise, night sweats, fever, chills, weight loss Neck: Negative for lumps, goiter, pain and significant neck swelling Resp: Negative for cough, wheezing, dyspnea at rest CV: Negative for chest pain, leg swelling, palpitations, orthopnea GI: denies melena, hematochezia, nausea, vomiting, diarrhea, constipation,odynophagia, early satiety or unintentional weight loss. +GERD/Dysphagia  MSK: Negative for joint pain or swelling, back pain, and muscle pain. Derm: Negative for itching or rash Psych: Denies depression, anxiety, memory loss, confusion. No homicidal or suicidal ideation.  Heme: Negative for prolonged bleeding, bruising easily, and  swollen nodes. Endocrine: Negative for cold or heat intolerance, polyuria, polydipsia and goiter. Neuro: negative for tremor, gait imbalance, syncope and seizures. The remainder of the review of systems is noncontributory.  Physical Exam: BP (!) 143/82 (BP Location: Left Arm, Patient Position: Sitting, Cuff Size: Large)   Pulse (!) 58   Temp 97.9 F (36.6 C) (Oral)   Ht 5\' 7"  (1.702 m)   Wt 205 lb 11.2 oz (93.3 kg)   LMP  (LMP Unknown)   BMI 32.22 kg/m  General:   Alert and oriented. No distress noted. Pleasant and cooperative.  Head:  Normocephalic and atraumatic. Eyes:  Conjuctiva clear without scleral icterus. Mouth:  Oral mucosa pink and moist. Good dentition. No lesions. Heart: Normal rate and rhythm, s1 and s2 heart sounds present.  Lungs: Clear lung sounds in all lobes. Respirations equal and unlabored. Abdomen:  +BS, soft, non-tender and non-distended. No rebound or guarding. No HSM or masses noted. Derm: No palmar erythema or jaundice Msk:  Symmetrical without gross deformities. Normal posture. Extremities:  Without edema. Neurologic:  Alert and  oriented x4 Psych:  Alert and cooperative. Normal mood and affect.  Invalid input(s): "6 MONTHS"   ASSESSMENT: Molly Knight is a 67 y.o. female presenting today for GERD and  dysphagia.  Notes long history of GERD, on protonix 40mg  daily with some improvement though still feeling the need to clear her throat and having some coughing with eating. She is having some dysphagia as well. History of distal esophageal stricture s/p dilation in 2020 with question of short segment BE at that time though unclear of biopsy results. Given ongoing GERD, dysphagia and question of BE, would recommend proceeding with EGD for further evaluation. For now will continue with protonix 40mg  daily and add pepcid 20mg  QHS. She should implement good reflux precautions to include being mindful greasy, spicy, fried, citrus foods, caffeine, carbonated drinks, chocolate and alcohol as these can increase reflux symptoms Stay upright 2-3 hours after eating, prior to lying down and avoid eating late in the evenings. Indications, risks and benefits of procedure discussed in detail with patient. Patient verbalized understanding and is in agreement to proceed with EGD +/- dilation at this time.   Family history of CRC in her sister (in her 94s), patient also with history of TAs, last TCS in 2020 with 1 TA, recommended repeat in 5 years. She will be due for colonoscopy in January 2025, will place her on the recall list for this.    PLAN:  Continue with protonix 40mg  daily  2. Start pepcid 20mg  QHS  3. Schedule EGD +/- dilation ASA II  4. Reflux precautions 5. Plan for colonoscopy in January 2025.   All questions were answered, patient verbalized understanding and is in agreement with plan as outlined above.    Follow Up: 3 months   Molly Knight L. Jeanmarie Hubert, MSN, APRN, AGNP-C Adult-Gerontology Nurse Practitioner [facility]  I have reviewed the note and agree with the APP's assessment as described in this progress note  Katrinka Blazing, MD Gastroenterology and Hepatology [facility]

## 2022-09-09 NOTE — Patient Instructions (Addendum)
Continue with protonix 40mg  daily  Start pepcid 20mg  in the evenings  We will get you scheduled for upper endoscopy for further evaluation   Be mindful of greasy, spicy, fried, citrus foods, caffeine, carbonated drinks, chocolate and alcohol as these can increase reflux symptoms Stay upright 2-3 hours after eating, prior to lying down and avoid eating late in the evenings.  We will plan for repeat colonoscopy in January 2025  Follow up 3 months  It was a pleasure to see you today. I want to create trusting relationships with patients and provide genuine, compassionate, and quality care. I truly value your feedback! please be on the lookout for a survey regarding your visit with me today. I appreciate your input about our visit and your time in completing this!    Molly Knight L. Jeanmarie Hubert, MSN, APRN, AGNP-C Adult-Gerontology Nurse Practitioner [facility]

## 2022-09-09 NOTE — Progress Notes (Signed)
Referring Provider: Anabel Halon, MD Primary Care Physician:  Anabel Halon, MD Primary GI Physician: new   Chief Complaint  Patient presents with   Gastroesophageal Reflux    Referred for GERD. Gets a lot of ticking in throat and coughing up stuff. Pills feel like that get stuck. Pcp started her on protonix 40mg  and it has helped.    HPI:   Molly Knight is a 67 y.o. female with past medical history of depression, GERD, hiatal hernia, thyroid disease   Patient presenting today as a new patient for GERD/question of possible Barrett's esophagus.  Patient reports long history of GERD. She notes that symptoms became worse around the end of 2023 or early 2024, she was on otc omeprazole 20mg  daily at that time. She discussed symptoms with her PCP and was started on protonix 40mg  daily. This has helped some but she notes a lot of coughing with swallowing. She feels the need to clear her throat a lot. Prior to starting protonix she felt like she was "gurgling" when she would lay down. Denies sore throat or hoarseness. She has occasional heartburn at times. She is also having some dysphagia starting to occur, more often with pills though sometimes with foods noting she cuts food into very small bites, chews well and eats a lot of softer foods to avoid choking. Denies nausea or vomiting. No rectal bleeding, melena, changes in appetite, weight loss. Last EGD in 2020 with question of short segment Barrett's esophagus though no biopsy report available and patient is unsure of results.    NSAID use: on occasion  Social hx: no etoh or alcohol  Fam hx: sister had CRC, passed at age 44   Last Colonoscopy: 04/2019 [facility]-descending colon polyp, Tubular adenoma  Last Endoscopy: 05/2018 with esophageal dilation-distal esophageal stricture, small sliding hiatal hernia, noted possible short segment Barrett's esophagus but no biopsy available-pt does not remember   Recommendations:  Repeat  TCS 5 years   Past Medical History:  Diagnosis Date   Depression    GERD (gastroesophageal reflux disease)    Hiatal hernia    Thyroid disease    Thyroid nodule 09/27/2015   Last Assessment & Plan:  Formatting of this note might be different from the original. Stable.  Her left lobe is normal in size on physical exam with no palpable nodule.    Past Surgical History:  Procedure Laterality Date   ABDOMINAL HYSTERECTOMY  2003   bladder tach     CESAREAN SECTION     COLONOSCOPY  04/2019   [place] - Dr. Roseanne Reno - repeat Jan 2025 per patient.   SHOULDER SURGERY Bilateral 02/2019   THYROIDECTOMY, PARTIAL  10/2016    Current Outpatient Medications  Medication Sig Dispense Refill   CALCIUM PO Take by mouth.     cetirizine (ZYRTEC) 10 MG tablet Take 10 mg by mouth daily.     FLUoxetine (PROZAC) 20 MG capsule TAKE 1 CAPSULE(20 MG) BY MOUTH DAILY 30 capsule 3   levothyroxine (SYNTHROID) 75 MCG tablet TAKE 1 TABLET(75 MCG) BY MOUTH DAILY BEFORE BREAKFAST 90 tablet 1   losartan (COZAAR) 50 MG tablet Take 1 tablet (50 mg total) by mouth daily. 90 tablet 1   meclizine (ANTIVERT) 25 MG tablet Take 1 tablet (25 mg total) by mouth 3 (three) times daily as needed for dizziness. 30 tablet 1   metroNIDAZOLE (METROGEL) 1 % gel SMARTSIG:1 Topical Every Night     Multiple Vitamins-Minerals (MULTI VITAMIN/MINERALS) TABS  Take 1 tablet by mouth daily.     pantoprazole (PROTONIX) 40 MG tablet Take 1 tablet (40 mg total) by mouth daily. 30 tablet 3   VITAMIN D PO Take by mouth.     No current facility-administered medications for this visit.    Allergies as of 09/09/2022   (No Known Allergies)    Family History  Problem Relation Age of Onset   Aortic aneurysm Mother    Renal Disease Father    Colon cancer Sister    Throat cancer Brother    Heart attack Sister    Other Sister        tahycardia   Other Brother        heart valve surgery    Social History   Socioeconomic History    Marital status: Married    Spouse name: Not on file   Number of children: Not on file   Years of education: Not on file   Highest education level: Not on file  Occupational History   Not on file  Tobacco Use   Smoking status: Never    Passive exposure: Never   Smokeless tobacco: Never  Vaping Use   Vaping Use: Never used  Substance and Sexual Activity   Alcohol use: Not Currently   Drug use: Never   Sexual activity: Not Currently    Birth control/protection: Surgical    Comment: hyst  Other Topics Concern   Not on file  Social History Narrative   Not on file   Social Determinants of Health   Financial Resource Strain: Low Risk  (08/27/2020)   Overall Financial Resource Strain (CARDIA)    Difficulty of Paying Living Expenses: Not hard at all  Food Insecurity: No Food Insecurity (08/27/2020)   Hunger Vital Sign    Worried About Running Out of Food in the Last Year: Never true    Ran Out of Food in the Last Year: Never true  Transportation Needs: No Transportation Needs (08/27/2020)   PRAPARE - Administrator, Civil Service (Medical): No    Lack of Transportation (Non-Medical): No  Physical Activity: Insufficiently Active (08/27/2020)   Exercise Vital Sign    Days of Exercise per Week: 3 days    Minutes of Exercise per Session: 30 min  Stress: No Stress Concern Present (08/27/2020)   [employer] of Occupational Health - Occupational Stress Questionnaire    Feeling of Stress : Not at all  Social Connections: Moderately Integrated (08/27/2020)   Social Connection and Isolation Panel [NHANES]    Frequency of Communication with Friends and Family: Once a week    Frequency of Social Gatherings with Friends and Family: More than three times a week    Attends Religious Services: 1 to 4 times per year    Active Member of [employer] or Organizations: No    Attends Engineer, structural: Never    Marital Status: Married    Review of systems General: negative for  malaise, night sweats, fever, chills, weight loss Neck: Negative for lumps, goiter, pain and significant neck swelling Resp: Negative for cough, wheezing, dyspnea at rest CV: Negative for chest pain, leg swelling, palpitations, orthopnea GI: denies melena, hematochezia, nausea, vomiting, diarrhea, constipation,odynophagia, early satiety or unintentional weight loss. +GERD/Dysphagia  MSK: Negative for joint pain or swelling, back pain, and muscle pain. Derm: Negative for itching or rash Psych: Denies depression, anxiety, memory loss, confusion. No homicidal or suicidal ideation.  Heme: Negative for prolonged bleeding, bruising easily, and  swollen nodes. Endocrine: Negative for cold or heat intolerance, polyuria, polydipsia and goiter. Neuro: negative for tremor, gait imbalance, syncope and seizures. The remainder of the review of systems is noncontributory.  Physical Exam: BP (!) 143/82 (BP Location: Left Arm, Patient Position: Sitting, Cuff Size: Large)   Pulse (!) 58   Temp 97.9 F (36.6 C) (Oral)   Ht 5\' 7"  (1.702 m)   Wt 205 lb 11.2 oz (93.3 kg)   LMP  (LMP Unknown)   BMI 32.22 kg/m  General:   Alert and oriented. No distress noted. Pleasant and cooperative.  Head:  Normocephalic and atraumatic. Eyes:  Conjuctiva clear without scleral icterus. Mouth:  Oral mucosa pink and moist. Good dentition. No lesions. Heart: Normal rate and rhythm, s1 and s2 heart sounds present.  Lungs: Clear lung sounds in all lobes. Respirations equal and unlabored. Abdomen:  +BS, soft, non-tender and non-distended. No rebound or guarding. No HSM or masses noted. Derm: No palmar erythema or jaundice Msk:  Symmetrical without gross deformities. Normal posture. Extremities:  Without edema. Neurologic:  Alert and  oriented x4 Psych:  Alert and cooperative. Normal mood and affect.  Invalid input(s): "6 MONTHS"   ASSESSMENT: Latosha Miedema is a 67 y.o. female presenting today for GERD and  dysphagia.  Notes long history of GERD, on protonix 40mg  daily with some improvement though still feeling the need to clear her throat and having some coughing with eating. She is having some dysphagia as well. History of distal esophageal stricture s/p dilation in 2020 with question of short segment BE at that time though unclear of biopsy results. Given ongoing GERD, dysphagia and question of BE, would recommend proceeding with EGD for further evaluation. For now will continue with protonix 40mg  daily and add pepcid 20mg  QHS. She should implement good reflux precautions to include being mindful greasy, spicy, fried, citrus foods, caffeine, carbonated drinks, chocolate and alcohol as these can increase reflux symptoms Stay upright 2-3 hours after eating, prior to lying down and avoid eating late in the evenings. Indications, risks and benefits of procedure discussed in detail with patient. Patient verbalized understanding and is in agreement to proceed with EGD +/- dilation at this time.   Family history of CRC in her sister (in her 94s), patient also with history of TAs, last TCS in 2020 with 1 TA, recommended repeat in 5 years. She will be due for colonoscopy in January 2025, will place her on the recall list for this.    PLAN:  Continue with protonix 40mg  daily  2. Start pepcid 20mg  QHS  3. Schedule EGD +/- dilation ASA II  4. Reflux precautions 5. Plan for colonoscopy in January 2025.   All questions were answered, patient verbalized understanding and is in agreement with plan as outlined above.    Follow Up: 3 months   Wilbur Labuda L. Jeanmarie Hubert, MSN, APRN, AGNP-C Adult-Gerontology Nurse Practitioner [facility]  I have reviewed the note and agree with the APP's assessment as described in this progress note  Katrinka Blazing, MD Gastroenterology and Hepatology [facility]

## 2022-09-11 ENCOUNTER — Encounter: Payer: Self-pay | Admitting: Obstetrics & Gynecology

## 2022-09-11 MED ORDER — ESTRADIOL 0.05 MG/24HR TD PTTW: 1.0000 | MEDICATED_PATCH | TRANSDERMAL | 12 refills | Status: DC

## 2022-09-16 DIAGNOSIS — H9012 Conductive hearing loss, unilateral, left ear, with unrestricted hearing on the contralateral side: Secondary | ICD-10-CM | POA: Diagnosis not present

## 2022-09-16 DIAGNOSIS — H7292 Unspecified perforation of tympanic membrane, left ear: Secondary | ICD-10-CM | POA: Diagnosis not present

## 2022-09-19 ENCOUNTER — Ambulatory Visit (HOSPITAL_COMMUNITY)
Admission: RE | Admit: 2022-09-19 | Discharge: 2022-09-19 | Disposition: A | Discharge status: Home / Self Care | Payer: Federal, State, Local not specified - PPO | Source: Ambulatory Visit | Attending: Obstetrics & Gynecology | Admitting: Obstetrics & Gynecology

## 2022-09-19 DIAGNOSIS — Z1231 Encounter for screening mammogram for malignant neoplasm of breast: Secondary | ICD-10-CM | POA: Insufficient documentation

## 2022-09-20 ENCOUNTER — Encounter: Payer: Self-pay | Admitting: Orthopaedic Surgery

## 2022-09-22 ENCOUNTER — Other Ambulatory Visit (HOSPITAL_COMMUNITY): Payer: Self-pay | Admitting: Obstetrics & Gynecology

## 2022-09-22 DIAGNOSIS — R928 Other abnormal and inconclusive findings on diagnostic imaging of breast: Secondary | ICD-10-CM

## 2022-09-23 ENCOUNTER — Ambulatory Visit (HOSPITAL_COMMUNITY)
Admission: RE | Admit: 2022-09-23 | Discharge: 2022-09-23 | Disposition: A | Discharge status: Home / Self Care | Payer: Federal, State, Local not specified - PPO | Source: Ambulatory Visit | Attending: Obstetrics & Gynecology | Admitting: Obstetrics & Gynecology

## 2022-09-23 ENCOUNTER — Encounter (HOSPITAL_COMMUNITY): Payer: Self-pay

## 2022-09-23 DIAGNOSIS — N6489 Other specified disorders of breast: Secondary | ICD-10-CM | POA: Diagnosis not present

## 2022-09-23 DIAGNOSIS — R928 Other abnormal and inconclusive findings on diagnostic imaging of breast: Secondary | ICD-10-CM

## 2022-09-25 DIAGNOSIS — K080 Exfoliation of teeth due to systemic causes: Secondary | ICD-10-CM | POA: Diagnosis not present

## 2022-10-02 ENCOUNTER — Other Ambulatory Visit: Payer: Self-pay | Admitting: Internal Medicine

## 2022-10-02 DIAGNOSIS — F320 Major depressive disorder, single episode, mild: Secondary | ICD-10-CM

## 2022-10-06 ENCOUNTER — Encounter (INDEPENDENT_AMBULATORY_CARE_PROVIDER_SITE_OTHER): Payer: Self-pay

## 2022-10-07 MED ORDER — ESTRADIOL 0.05 MG/24HR TD PTTW: 1.0000 | MEDICATED_PATCH | TRANSDERMAL | 3 refills | Status: DC

## 2022-10-07 NOTE — Addendum Note (Signed)
Addended by: Lazaro Arms on: 10/07/2022 11:49 AM   Modules accepted: Orders

## 2022-10-09 ENCOUNTER — Encounter (HOSPITAL_COMMUNITY)
Admission: RE | Disposition: A | Discharge status: Home / Self Care | Payer: Self-pay | Source: Home / Self Care | Attending: Gastroenterology

## 2022-10-09 ENCOUNTER — Other Ambulatory Visit: Payer: Self-pay

## 2022-10-09 ENCOUNTER — Encounter (HOSPITAL_COMMUNITY): Payer: Self-pay | Admitting: Gastroenterology

## 2022-10-09 ENCOUNTER — Ambulatory Visit (HOSPITAL_COMMUNITY): Payer: Federal, State, Local not specified - PPO | Admitting: Anesthesiology

## 2022-10-09 ENCOUNTER — Ambulatory Visit (HOSPITAL_COMMUNITY)
Admission: RE | Admit: 2022-10-09 | Discharge: 2022-10-09 | Disposition: A | Discharge status: Home / Self Care | Payer: Federal, State, Local not specified - PPO | Attending: Gastroenterology | Admitting: Gastroenterology

## 2022-10-09 DIAGNOSIS — K219 Gastro-esophageal reflux disease without esophagitis: Secondary | ICD-10-CM | POA: Diagnosis not present

## 2022-10-09 DIAGNOSIS — E039 Hypothyroidism, unspecified: Secondary | ICD-10-CM | POA: Diagnosis not present

## 2022-10-09 DIAGNOSIS — K222 Esophageal obstruction: Secondary | ICD-10-CM | POA: Diagnosis not present

## 2022-10-09 DIAGNOSIS — K449 Diaphragmatic hernia without obstruction or gangrene: Secondary | ICD-10-CM | POA: Diagnosis not present

## 2022-10-09 DIAGNOSIS — Z800 Family history of malignant neoplasm of digestive organs: Secondary | ICD-10-CM | POA: Insufficient documentation

## 2022-10-09 DIAGNOSIS — R1310 Dysphagia, unspecified: Secondary | ICD-10-CM | POA: Insufficient documentation

## 2022-10-09 DIAGNOSIS — Z79899 Other long term (current) drug therapy: Secondary | ICD-10-CM | POA: Insufficient documentation

## 2022-10-09 HISTORY — PX: SAVORY DILATION: SHX5439

## 2022-10-09 HISTORY — PX: ESOPHAGOGASTRODUODENOSCOPY (EGD) WITH PROPOFOL: SHX5813

## 2022-10-09 SURGERY — ESOPHAGOGASTRODUODENOSCOPY (EGD) WITH PROPOFOL
Anesthesia: General

## 2022-10-09 MED ORDER — LIDOCAINE HCL (CARDIAC) PF 100 MG/5ML IV SOSY
PREFILLED_SYRINGE | INTRAVENOUS | Status: DC | PRN
  Administered 2022-10-09: 50 mg via INTRAVENOUS

## 2022-10-09 MED ORDER — PROPOFOL 500 MG/50ML IV EMUL
INTRAVENOUS | Status: DC | PRN
  Administered 2022-10-09: 150 ug/kg/min via INTRAVENOUS

## 2022-10-09 MED ORDER — LACTATED RINGERS IV SOLN: INTRAVENOUS | Status: DC

## 2022-10-09 MED ORDER — PROPOFOL 10 MG/ML IV BOLUS
INTRAVENOUS | Status: DC | PRN
  Administered 2022-10-09: 100 mg via INTRAVENOUS

## 2022-10-09 NOTE — Op Note (Addendum)
[facility] Patient Name: Molly Knight Procedure Date: 10/09/2022 12:28 PM MRN: [id number] Date of Birth: 06-Dec-1955 Attending MD: Katrinka Blazing , , [id number] CSN: [id number] Age: 67 Admit Type: Outpatient Procedure:                Upper GI endoscopy Indications:              Dysphagia, Gastro-esophageal reflux disease Providers:                Katrinka Blazing, Sheran Fava, Pandora Leiter,                            Technician Referring MD:              Medicines:                Monitored Anesthesia Care Complications:            No immediate complications. Estimated Blood Loss:     Estimated blood loss: none. Procedure:                Pre-Anesthesia Assessment:                           - Prior to the procedure, a History and Physical                            was performed, and patient medications, allergies                            and sensitivities were reviewed. The patient's                            tolerance of previous anesthesia was reviewed.                           - The risks and benefits of the procedure and the                            sedation options and risks were discussed with the                            patient. All questions were answered and informed                            consent was obtained.                           - ASA Grade Assessment: II - A patient with mild                            systemic disease.                           After obtaining informed consent, the endoscope was                            passed under direct vision. Throughout the  procedure, the patient's blood pressure, pulse, and                            oxygen saturations were monitored continuously. The                            [id number]-H190 ([id number]) scope was introduced through the                            mouth, and advanced to the second part of duodenum.                            The upper GI endoscopy was  accomplished without                            difficulty. The patient tolerated the procedure                            well. Scope In: 12:38:13 PM Scope Out: 12:45:34 PM Total Procedure Duration: 0 hours 7 minutes 21 seconds  Findings:      No endoscopic abnormality was evident in the esophagus to explain the       patient's complaint of dysphagia. It was decided, however, to proceed       with dilation. A guidewire was placed and the scope was withdrawn.       Dilation was performed with a Savary dilator with mild resistance at 18       mm. The dilation site was examined following endoscope reinsertion and       showed mild mucosal disruption in the upper third.      A 1 cm hiatal hernia was present.      The gastroesophageal flap valve was visualized endoscopically and       classified as Hill Grade III (minimal fold, loose to endoscope, hiatal       hernia likely).      The stomach was normal.      The examined duodenum was normal. Impression:               - No endoscopic esophageal abnormality to explain                            patient's dysphagia. Esophagus dilated. Dilated.                           - 1 cm hiatal hernia.                           - Normal stomach.                           - Normal examined duodenum.                           - No specimens collected. Moderate Sedation:      Per Anesthesia Care Recommendation:           - Discharge patient to home (ambulatory).                           -  Resume previous diet.                           - Continue present medications. May increase                            pantoprazole to twice a day dosing and stop                            famotidine if not presenting symptom relief. Procedure Code(s):        --- Professional ---                           [phone], Esophagogastroduodenoscopy, flexible,                            transoral; with insertion of guide wire followed by                            passage of  dilator(s) through esophagus over guide                            wire Diagnosis Code(s):        --- Professional ---                           R13.10, Dysphagia, unspecified                           K44.9, Diaphragmatic hernia without obstruction or                            gangrene                           K21.9, Gastro-esophageal reflux disease without                            esophagitis CPT copyright 2022 [employer]. All rights reserved. The codes documented in this report are preliminary and upon coder review may  be revised to meet current compliance requirements. Katrinka Blazing, MD Katrinka Blazing,  10/09/2022 12:53:51 PM This report has been signed electronically. Number of Addenda: 0

## 2022-10-09 NOTE — Transfer of Care (Signed)
Immediate Anesthesia Transfer of Care Note  Patient: Molly Knight  Procedure(s) Performed: ESOPHAGOGASTRODUODENOSCOPY (EGD) WITH PROPOFOL SAVORY DILATION  Patient Location: Endoscopy Unit  Anesthesia Type:General  Level of Consciousness: drowsy  Airway & Oxygen Therapy: Patient Spontanous Breathing  Post-op Assessment: Report given to RN and Post -op Vital signs reviewed and stable  Post vital signs: Reviewed and stable  Last Vitals:  Vitals Value Taken Time  BP    Temp    Pulse    Resp    SpO2      Last Pain:  Vitals:   10/09/22 1234  TempSrc:   PainSc: 0-No pain      Patients Stated Pain Goal: 5 (10/09/22 1117)  Complications: No notable events documented.

## 2022-10-09 NOTE — Interval H&P Note (Signed)
History and Physical Interval Note:  10/09/2022 11:28 AM  Molly Knight  has presented today for surgery, with the diagnosis of dysphagia.  The various methods of treatment have been discussed with the patient and family. After consideration of risks, benefits and other options for treatment, the patient has consented to  Procedure(s) with comments: ESOPHAGOGASTRODUODENOSCOPY (EGD) WITH PROPOFOL (N/A) - 12:30pm;asa 2 as a surgical intervention.  The patient's history has been reviewed, patient examined, no change in status, stable for surgery.  I have reviewed the patient's chart and labs.  Questions were answered to the patient's satisfaction.     [name]

## 2022-10-09 NOTE — Anesthesia Procedure Notes (Signed)
Date/Time: 10/09/2022 12:38 PM  Performed by: Julian Reil, CRNAPre-anesthesia Checklist: Patient identified, Emergency Drugs available, Suction available and Patient being monitored Patient Re-evaluated:Patient Re-evaluated prior to induction Oxygen Delivery Method: Nasal cannula Induction Type: IV induction Placement Confirmation: positive ETCO2

## 2022-10-09 NOTE — Anesthesia Postprocedure Evaluation (Signed)
Anesthesia Post Note  Patient: Molly Knight  Procedure(s) Performed: ESOPHAGOGASTRODUODENOSCOPY (EGD) WITH PROPOFOL SAVORY DILATION  Patient location during evaluation: Phase II Anesthesia Type: General Level of consciousness: awake and alert and oriented Pain management: pain level controlled Vital Signs Assessment: post-procedure vital signs reviewed and stable Respiratory status: spontaneous breathing, nonlabored ventilation and respiratory function stable Cardiovascular status: blood pressure returned to baseline and stable Postop Assessment: no apparent nausea or vomiting Anesthetic complications: no  No notable events documented.   Last Vitals:  Vitals:   10/09/22 1117 10/09/22 1250  BP: 132/85 117/62  Pulse: (!) 55 61  Resp: 12 17  Temp: 36.7 C 36.5 C  SpO2: 97% 93%    Last Pain:  Vitals:   10/09/22 1250  TempSrc: Oral  PainSc: 0-No pain                 Moneka Mcquinn C Annalee Meyerhoff

## 2022-10-09 NOTE — Discharge Instructions (Addendum)
You are being discharged to home.  Resume your previous diet.  Continue your present medications.  Continue present medications. May increase pantoprazole to twice a day dosing and stop famotidine if not presenting symptom relief.

## 2022-10-09 NOTE — Anesthesia Preprocedure Evaluation (Signed)
Anesthesia Evaluation  Patient identified by MRN, date of birth, ID band Patient awake    Reviewed: Allergy & Precautions, H&P , NPO status , Patient's Chart, lab work & pertinent test results  Airway Mallampati: III  TM Distance: >3 FB Neck ROM: Full    Dental  (+) Dental Advisory Given, Teeth Intact   Pulmonary neg pulmonary ROS   Pulmonary exam normal breath sounds clear to auscultation       Cardiovascular Exercise Tolerance: Good hypertension, Pt. on medications Normal cardiovascular exam Rhythm:Regular Rate:Normal     Neuro/Psych  PSYCHIATRIC DISORDERS  Depression     Neuromuscular disease    GI/Hepatic Neg liver ROS, hiatal hernia,GERD  Medicated and Controlled,,  Endo/Other  Hypothyroidism    Renal/GU negative Renal ROS  negative genitourinary   Musculoskeletal  (+) Arthritis , Osteoarthritis,    Abdominal   Peds negative pediatric ROS (+)  Hematology negative hematology ROS (+)   Anesthesia Other Findings   Reproductive/Obstetrics negative OB ROS                             Anesthesia Physical Anesthesia Plan  ASA: 2  Anesthesia Plan: General   Post-op Pain Management: Minimal or no pain anticipated   Induction: Intravenous  PONV Risk Score and Plan: 0 and Treatment may vary due to age or medical condition and Propofol infusion  Airway Management Planned: Nasal Cannula and Natural Airway  Additional Equipment:   Intra-op Plan:   Post-operative Plan:   Informed Consent: I have reviewed the patients History and Physical, chart, labs and discussed the procedure including the risks, benefits and alternatives for the proposed anesthesia with the patient or authorized representative who has indicated his/her understanding and acceptance.     Dental advisory given  Plan Discussed with: CRNA and Surgeon  Anesthesia Plan Comments:        Anesthesia Quick  Evaluation

## 2022-10-14 ENCOUNTER — Encounter (INDEPENDENT_AMBULATORY_CARE_PROVIDER_SITE_OTHER): Payer: Self-pay

## 2022-10-14 ENCOUNTER — Other Ambulatory Visit (INDEPENDENT_AMBULATORY_CARE_PROVIDER_SITE_OTHER): Payer: Self-pay | Admitting: Gastroenterology

## 2022-10-14 DIAGNOSIS — R1310 Dysphagia, unspecified: Secondary | ICD-10-CM

## 2022-10-14 MED ORDER — ALUM & MAG HYDROXIDE-SIMETH 200-200-20 MG/5ML PO SUSP
5.0000 mL | Freq: Four times a day (QID) | ORAL | 0 refills | Status: DC | PRN
Start: 2022-10-14 — End: 2022-11-19

## 2022-10-16 DIAGNOSIS — L820 Inflamed seborrheic keratosis: Secondary | ICD-10-CM | POA: Diagnosis not present

## 2022-10-17 ENCOUNTER — Other Ambulatory Visit (INDEPENDENT_AMBULATORY_CARE_PROVIDER_SITE_OTHER): Payer: Self-pay | Admitting: *Deleted

## 2022-10-17 ENCOUNTER — Encounter: Payer: Self-pay | Admitting: Orthopedic Surgery

## 2022-10-17 ENCOUNTER — Ambulatory Visit (INDEPENDENT_AMBULATORY_CARE_PROVIDER_SITE_OTHER): Payer: Federal, State, Local not specified - PPO | Admitting: Orthopedic Surgery

## 2022-10-17 VITALS — BP 143/79 | HR 56 | Ht 67.0 in | Wt 205.0 lb

## 2022-10-17 DIAGNOSIS — S46001D Unspecified injury of muscle(s) and tendon(s) of the rotator cuff of right shoulder, subsequent encounter: Secondary | ICD-10-CM | POA: Diagnosis not present

## 2022-10-17 NOTE — Progress Notes (Signed)
Return patient Visit  Assessment: Molly Knight is a 67 y.o. female with the following: Right rotator cuff tendinitis  Plan: Chrysta Izzard has pain in her right shoulder.  Prior steroid injections improved her symptoms until couple weeks ago.  She is planning to go out of town in a month or so, he does not want to have pain in her shoulder.  On physical exam, she has very good range of motion, but I can elicit some pain with strength testing.  She is interested in another injection.  No pain in the anterior shoulder, so we will proceed with a glenohumeral joint injection only.  Continue with Tylenol, and can also consider occasional NSAIDs if it is bothering her.  Follow-up as needed.  Procedure note injection - Right shoulder, ultrasound guidance   Verbal consent was obtained to inject the Right shoulder, glenohumeral joint  Timeout was completed to confirm the site of injection.   Using the ultrasound, the rotator cuff tendons were identified.  The joint space was also identified. The skin was prepped with alcohol and ethyl chloride was sprayed at the injection site.  A 21-gauge needle was used to inject 40 mg of Depo-Medrol and 1% lidocaine (4 cc) into the glenohumeral joint space of the Right shoulder using a posterolateral approach.  The needle was visualized entering the glenohumeral joint, and the medication was also visualized. There were no complications.  A sterile bandage was applied.   Note: In order to accurately identify the placement of the needle, ultrasound was required, to increase the accuracy, and specificity of the injection.      Follow-up: Return if symptoms worsen or fail to improve.  Subjective:  No chief complaint on file.   History of Present Illness: Molly Knight is a 67 y.o. female who returns for evaluation of right shoulder pain.  I saw her in clinic almost 3 months ago.  At that time, she was having pain in the lateral and anterior  shoulder.  She had a glenohumeral joint, as well as a biceps tendon injection.  Pain get worse for about a week, then improved.  Over the past couple weeks, it is starting to bother her.  When she uses her arm more, she has more pain.  She takes Tylenol.  She will use Voltaren gel.  No new injuries.  Review of Systems: No fevers or chills No numbness or tingling No chest pain No shortness of breath No bowel or bladder dysfunction No GI distress No headaches     Objective: BP (!) 143/79   Pulse (!) 56   Ht 5\' 7"  (1.702 m)   Wt 205 lb (93 kg)   LMP  (LMP Unknown)   BMI 32.11 kg/m   Physical Exam:  General: Alert and oriented. and No acute distress. Gait: Normal gait.  Shoulder with well-healed surgical incisions.  Active forward flexion 170 degrees.  Abduction to 90 degrees.  No tenderness to palpation over the anterior shoulder.  Negative belly press.  5/5 strength, with some discomfort in the right shoulder.  Fingers are warm and well-perfused.  IMAGING: I personally reviewed images previously obtained in clinic   IMPRESSION: Severe tendinosis with low-grade interstitial and bursal sided tearing of the anterior supraspinatus tendon at the footprint. Severe distal infraspinatus tendinosis with bursal sided fraying. Mild tendinosis of the distal subscapularis tendon with articular sided fraying of the mid fibers. No high-grade or retracted cuff tear. No significant muscle atrophy.   Moderate intra-articular long head biceps tendinosis.  Moderate glenohumeral and AC joint osteoarthritis.   Diffuse degenerative labral fraying.  New Medications:  No orders of the defined types were placed in this encounter.     Oliver Barre, MD  10/17/2022 9:16 AM

## 2022-10-20 ENCOUNTER — Encounter (HOSPITAL_COMMUNITY): Payer: Self-pay | Admitting: Gastroenterology

## 2022-10-21 ENCOUNTER — Ambulatory Visit (INDEPENDENT_AMBULATORY_CARE_PROVIDER_SITE_OTHER): Payer: Medicare Other | Admitting: Gastroenterology

## 2022-11-06 ENCOUNTER — Other Ambulatory Visit (INDEPENDENT_AMBULATORY_CARE_PROVIDER_SITE_OTHER): Payer: Self-pay | Admitting: Gastroenterology

## 2022-11-08 ENCOUNTER — Encounter: Payer: Self-pay | Admitting: Internal Medicine

## 2022-11-09 ENCOUNTER — Other Ambulatory Visit: Payer: Self-pay

## 2022-11-09 ENCOUNTER — Encounter (HOSPITAL_COMMUNITY): Payer: Self-pay

## 2022-11-09 ENCOUNTER — Emergency Department (HOSPITAL_COMMUNITY)
Admission: EM | Admit: 2022-11-09 | Discharge: 2022-11-09 | Disposition: A | Discharge status: Home / Self Care | Payer: Medicare Other | Attending: Emergency Medicine | Admitting: Emergency Medicine

## 2022-11-09 ENCOUNTER — Emergency Department (HOSPITAL_COMMUNITY): Payer: Federal, State, Local not specified - PPO

## 2022-11-09 DIAGNOSIS — I10 Essential (primary) hypertension: Secondary | ICD-10-CM | POA: Insufficient documentation

## 2022-11-09 DIAGNOSIS — E871 Hypo-osmolality and hyponatremia: Secondary | ICD-10-CM | POA: Diagnosis not present

## 2022-11-09 DIAGNOSIS — E039 Hypothyroidism, unspecified: Secondary | ICD-10-CM | POA: Diagnosis not present

## 2022-11-09 DIAGNOSIS — R079 Chest pain, unspecified: Secondary | ICD-10-CM

## 2022-11-09 DIAGNOSIS — U071 COVID-19: Secondary | ICD-10-CM | POA: Diagnosis not present

## 2022-11-09 DIAGNOSIS — Z79899 Other long term (current) drug therapy: Secondary | ICD-10-CM | POA: Diagnosis not present

## 2022-11-09 DIAGNOSIS — R0789 Other chest pain: Secondary | ICD-10-CM | POA: Insufficient documentation

## 2022-11-09 LAB — CBC WITH DIFFERENTIAL/PLATELET
Abs Immature Granulocytes: 0 10*3/uL (ref 0.00–0.07)
Basophils Absolute: 0 10*3/uL (ref 0.0–0.1)
Basophils Relative: 1 %
Eosinophils Absolute: 0.1 10*3/uL (ref 0.0–0.5)
Eosinophils Relative: 3 %
HCT: 38.5 % (ref 36.0–46.0)
Hemoglobin: 13 g/dL (ref 12.0–15.0)
Immature Granulocytes: 0 %
Lymphocytes Relative: 49 %
Lymphs Abs: 2.1 10*3/uL (ref 0.7–4.0)
MCH: 31.4 pg (ref 26.0–34.0)
MCHC: 33.8 g/dL (ref 30.0–36.0)
MCV: 93 fL (ref 80.0–100.0)
Monocytes Absolute: 0.3 10*3/uL (ref 0.1–1.0)
Monocytes Relative: 7 %
Neutro Abs: 1.7 10*3/uL (ref 1.7–7.7)
Neutrophils Relative %: 40 %
Platelets: 230 10*3/uL (ref 150–400)
RBC: 4.14 MIL/uL (ref 3.87–5.11)
RDW: 12 % (ref 11.5–15.5)
WBC: 4.3 10*3/uL (ref 4.0–10.5)
nRBC: 0 % (ref 0.0–0.2)

## 2022-11-09 LAB — COMPREHENSIVE METABOLIC PANEL
ALT: 20 U/L (ref 0–44)
AST: 28 U/L (ref 15–41)
Albumin: 3.5 g/dL (ref 3.5–5.0)
Alkaline Phosphatase: 69 U/L (ref 38–126)
Anion gap: 7 (ref 5–15)
BUN: 12 mg/dL (ref 8–23)
CO2: 23 mmol/L (ref 22–32)
Calcium: 8.4 mg/dL — ABNORMAL LOW (ref 8.9–10.3)
Chloride: 101 mmol/L (ref 98–111)
Creatinine, Ser: 0.76 mg/dL (ref 0.44–1.00)
GFR, Estimated: >60 mL/min (ref >60)
Glucose, Bld: 145 mg/dL — ABNORMAL HIGH (ref 70–99)
Potassium: 3.5 mmol/L (ref 3.5–5.1)
Sodium: 131 mmol/L — ABNORMAL LOW (ref 135–145)
Total Bilirubin: 0.6 mg/dL (ref 0.3–1.2)
Total Protein: 6.8 g/dL (ref 6.5–8.1)

## 2022-11-09 LAB — D-DIMER, QUANTITATIVE: D-Dimer, Quant: 0.36 ug/mL-FEU (ref 0.00–0.50)

## 2022-11-09 LAB — MAGNESIUM: Magnesium: 1.9 mg/dL (ref 1.7–2.4)

## 2022-11-09 LAB — TROPONIN I (HIGH SENSITIVITY)
Troponin I (High Sensitivity): 3 ng/L (ref <18)
Troponin I (High Sensitivity): 3 ng/L (ref <18)

## 2022-11-09 LAB — RESP PANEL BY RT-PCR (RSV, FLU A&B, COVID)  RVPGX2
Influenza A by PCR: NEGATIVE
Influenza B by PCR: NEGATIVE
Resp Syncytial Virus by PCR: NEGATIVE
SARS Coronavirus 2 by RT PCR: POSITIVE — AB

## 2022-11-09 MED ORDER — ASPIRIN 325 MG PO TABS
325.0000 mg | ORAL_TABLET | Freq: Once | ORAL | Status: AC
  Administered 2022-11-09: 325 mg via ORAL
  Filled 2022-11-09: qty 1

## 2022-11-09 MED ORDER — PAXLOVID (300/100) 20 X 150 MG & 10 X 100MG PO TBPK: 3.0000 | ORAL_TABLET | Freq: Two times a day (BID) | ORAL | 0 refills | Status: AC

## 2022-11-09 MED ORDER — NITROGLYCERIN 0.4 MG SL SUBL
0.4000 mg | SUBLINGUAL_TABLET | Freq: Once | SUBLINGUAL | Status: AC
  Administered 2022-11-09: 0.4 mg via SUBLINGUAL
  Filled 2022-11-09: qty 1

## 2022-11-09 MED ORDER — NITROGLYCERIN 0.4 MG SL SUBL: 0.4000 mg | SUBLINGUAL_TABLET | SUBLINGUAL | 0 refills | Status: DC | PRN

## 2022-11-09 NOTE — ED Triage Notes (Signed)
Chest pain started 1 hour ago Pressure center of chest 10/10 Radiates to back Pt anxious and talking fast POSTIVE COVID test at home yesterday  Denies SOB  Denies cardiac hx

## 2022-11-09 NOTE — Discharge Instructions (Addendum)
As discussed, workup today overall reassuring.  Your heart enzymes were normal no evidence of obvious abnormality on EKG.  Low suspicion that you are having a heart attack.  No evidence of pneumonia, fluid on the lung or other abnormality on your chest x-ray.  Blood test for screening for blood clot in the lung was negative.  As discussed, we will send referral information for cardiology and even.  I have sent in an antiviral called Paxlovid for treatment of COVID as well as given nitroglycerin to take if you develop chest pain again.  If development of chest pain, return to the emergency department.  Please do not hesitate to return to emergency department if the worrisome signs and symptoms we discussed become apparent.

## 2022-11-09 NOTE — ED Provider Notes (Signed)
[facility] EMERGENCY DEPARTMENT AT [facility] Provider Note   CSN: [id number] Arrival date & time: 11/09/22  1053     History  Chief Complaint  Patient presents with   Chest Pain    Molly Knight is a 67 y.o. female.   Chest Pain   67 year old female presents emergency department with complaints of chest pain.  Chest pain described as pressure and as if someone is sitting on her chest.  States that symptoms began when she was standing at the sink taking her vitamins earlier today.  States she has a history of GERD and she initially thought symptoms were related to reflux irritated by taking her vitamins but states the pain was in a different location and different sensation.  Reports some radiation to her thoracic back.  States symptoms began approximately an hour ago and have significantly eased since then but is still experiencing some chest pressure.  Patient states that she has been with cough for the past 3 to 4 days with positive at-home COVID testing yesterday.  Denies any fever, chills, shortness of breath, abdominal pain, nausea, vomiting.  Denies any personal history of cardiac complications but does state that she has a sister who had a heart attack as well as a mother who had a thoracic aortic aneurysm.  [place] yesterday and uses estrogen for post menopause symptoms.  Denies any DVT/PE, known coagulopathy, known malignancy.  Past medical history significant for hypertension, hyperlipidemia, hypothyroidism GERD, hiatal hernia  Home Medications Prior to Admission medications   Medication Sig Start Date End Date Taking? Authorizing Provider  nirmatrelvir & ritonavir (PAXLOVID, 300/100,) 20 x 150 MG & 10 x 100MG  TBPK Take 3 tablets by mouth 2 (two) times daily for 5 days. 11/09/22 11/14/22 Yes Sherian Maroon A, PA  nitroGLYCERIN (NITROSTAT) 0.4 MG SL tablet Place 1 tablet (0.4 mg total)  under the tongue every 5 (five) minutes as needed for chest pain. 11/09/22  Yes Sherian Maroon A, PA  acetaminophen (TYLENOL) 500 MG tablet Take 500 mg by mouth every 6 (six) hours as needed for moderate pain.    [provider]  Calcium Citrate-Vitamin D (CALCIUM + D PO) Take 1 tablet by mouth 2 (two) times daily.    [provider]  cetirizine (ZYRTEC) 10 MG tablet Take 10 mg by mouth daily.    [provider]  ciprofloxacin-dexamethasone (CIPRODEX) OTIC suspension Place 4 drops into the left ear 2 (two) times daily as needed (ear infections). 07/18/22   [provider]  estradiol (VIVELLE-DOT) 0.05 MG/24HR patch Place 1 patch (0.05 mg total) onto the skin 2 (two) times a week. 10/09/22   Lazaro Arms, MD  famotidine (PEPCID) 20 MG tablet TAKE 1 TABLET(20 MG) BY MOUTH AT BEDTIME 11/06/22   Carlan, Chelsea L, NP  FLUoxetine (PROZAC) 20 MG capsule TAKE 1 CAPSULE(20 MG) BY MOUTH DAILY 10/02/22   Anabel Halon, MD  levothyroxine (SYNTHROID) 75 MCG tablet TAKE 1 TABLET(75 MCG) BY MOUTH DAILY BEFORE BREAKFAST 06/09/22   Anabel Halon, MD  losartan (COZAAR) 50 MG tablet Take 1 tablet (50 mg total) by mouth daily. 05/13/22   Anabel Halon, MD  magic mouthwash (lidocaine, diphenhydrAMINE, alum & mag hydroxide) suspension Swish and swallow 5 mLs 4 (four) times daily as needed for mouth pain. 10/14/22   Dolores Frame, MD  meclizine (ANTIVERT) 25 MG tablet Take 1 tablet (25 mg  total) by mouth 3 (three) times daily as needed for dizziness. 08/30/21   Anabel Halon, MD  metroNIDAZOLE (METROGEL) 1 % gel Apply 1 Application topically daily as needed (rosacea). 07/20/19   [provider]  Multiple Vitamins-Minerals (MULTI VITAMIN/MINERALS) TABS Take 1 tablet by mouth daily.    [provider]  pantoprazole (PROTONIX) 40 MG tablet Take 1 tablet (40 mg total) by mouth daily. 08/26/22   Anabel Halon, MD      Allergies    Patient has no known  allergies.    Review of Systems   Review of Systems  Cardiovascular:  Positive for chest pain.  All other systems reviewed and are negative.   Physical Exam Updated Vital Signs BP 131/79   Pulse (!) 54   Temp 98.2 F (36.8 C) (Oral)   Resp 19   Ht 5\' 7"  (1.702 m)   Wt 89.8 kg   LMP  (LMP Unknown)   SpO2 97%   BMI 31.01 kg/m  Physical Exam Vitals and nursing note reviewed.  Constitutional:      General: She is not in acute distress.    Appearance: She is well-developed.  HENT:     Head: Normocephalic and atraumatic.  Eyes:     Conjunctiva/sclera: Conjunctivae normal.  Cardiovascular:     Rate and Rhythm: Normal rate and regular rhythm.     Heart sounds: No murmur heard. Pulmonary:     Effort: Pulmonary effort is normal. No respiratory distress.     Breath sounds: Normal breath sounds. No wheezing, rhonchi or rales.  Abdominal:     Palpations: Abdomen is soft.     Tenderness: There is no abdominal tenderness. There is no right CVA tenderness, left CVA tenderness or guarding.  Musculoskeletal:        General: No swelling.     Cervical back: Neck supple.     Right lower leg: No edema.     Left lower leg: No edema.  Skin:    General: Skin is warm and dry.     Capillary Refill: Capillary refill takes less than 2 seconds.  Neurological:     Mental Status: She is alert.  Psychiatric:        Mood and Affect: Mood normal.     ED Results / Procedures / Treatments   Labs (all labs ordered are listed, but only abnormal results are displayed) Labs Reviewed  RESP PANEL BY RT-PCR (RSV, FLU A&B, COVID)  RVPGX2 - Abnormal; Notable for the following components:      Result Value   SARS Coronavirus 2 by RT PCR POSITIVE (*)    All other components within normal limits  COMPREHENSIVE METABOLIC PANEL - Abnormal; Notable for the following components:   Sodium 131 (*)    Glucose, Bld 145 (*)    Calcium 8.4 (*)    All other components within normal limits  CBC WITH  DIFFERENTIAL/PLATELET  MAGNESIUM  D-DIMER, QUANTITATIVE  TROPONIN I (HIGH SENSITIVITY)  TROPONIN I (HIGH SENSITIVITY)    EKG EKG Interpretation Date/Time:  Sunday November 09 2022 11:00:20 EDT Ventricular Rate:  66 PR Interval:  142 QRS Duration:  88 QT Interval:  422 QTC Calculation: 442 R Axis:   27  Text Interpretation: Normal sinus rhythm Confirmed by Gloris Manchester ([id number]) on 11/09/2022 11:14:15 AM  Radiology DG Chest Port 1 View  Result Date: 11/09/2022 CLINICAL DATA:  Chest pain and pressure. EXAM: PORTABLE CHEST 1 VIEW COMPARISON:  None Available. FINDINGS: The heart size  and mediastinal contours are within normal limits. Both lungs are clear. Degenerative changes are seen in the spine. IMPRESSION: No active cardiopulmonary disease. Electronically Signed   By: Romona Curls M.D.   On: 11/09/2022 12:04    Procedures Procedures    Medications Ordered in ED Medications  nitroGLYCERIN (NITROSTAT) SL tablet 0.4 mg (0.4 mg Sublingual Given 11/09/22 1126)  aspirin tablet 325 mg (325 mg Oral Given 11/09/22 1125)    ED Course/ Medical Decision Making/ A&P Clinical Course as of 11/09/22 1541  Sun Nov 09, 2022  1254 Reevaluation of the patient showed significant improvement/near resolution of symptoms after administration of nitroglycerin and aspirin.  Awaiting second troponin to assess for potential delta change. [CR]    Clinical Course User Index [CR] Peter Garter, PA                             Medical Decision Making Amount and/or Complexity of Data Reviewed Labs: ordered. Radiology: ordered.  Risk OTC drugs. Prescription drug management.   This patient presents to the ED for concern of chest pain, this involves an extensive number of treatment options, and is a complaint that carries with it a high risk of complications and morbidity.  The differential diagnosis includes ACS, PE, pneumothorax, aortic dissection, GERD, pericarditis/myocarditis/tamponade, aortic  aneurysm,, musculoskeletal  Co morbidities that complicate the patient evaluation  See HPI   Additional history obtained:  Additional history obtained from EMR External records from outside source obtained and reviewed including hospital records   Lab Tests:  I Ordered, and personally interpreted labs.  The pertinent results include: No leukocytosis.  No evidence of anemia.  Platelets within range.  D-dimer within normal limits.  Mild hyponatremia and hypocalcemia 131 and 8.4 respectively but otherwise, no electrolyte abnormalities.  No transaminitis.  No renal dysfunction.  Respiratory viral panel positive for COVID.  Initial troponin of 3 with repeat 3   Imaging Studies ordered:  I ordered imaging studies including chest x-ray I independently visualized and interpreted imaging which showed no acute cardiopulmonary abnormalities I agree with the radiologist interpretation   Cardiac Monitoring: / EKG:  The patient was maintained on a cardiac monitor.  I personally viewed and interpreted the cardiac monitored which showed an underlying rhythm of: Sinus rhythm   Consultations Obtained:  I requested consultation with attending physician Dr. Durwin Nora who is in agreement with treatment plan going forward   Problem List / ED Course / Critical interventions / Medication management  Chest pain, COVID I ordered medication including nitroglycerin, aspirin   Reevaluation of the patient after these medicines showed that the patient improved I have reviewed the patients home medicines and have made adjustments as needed   Social Determinants of Health:  Denies tobacco, illicit drug use   Test / Admission - Considered:  Chest pain, COVID Vitals signs  within normal range and stable throughout visit. Laboratory/imaging studies significant for: See above 67 year old female presents emergency department with complaints of central chest pressure with some radiation to her thoracic  back.  Symptoms began when she was sitting at the sink and swallowed her vitamin pills and persisted prompting visit to the emergency department.  Patient reports history of reflux as well as pill esophagitis and stated that symptoms deferred in quality as well as severity prompting more thorough cardiac workup.  Patient with risk factors for PE including recent prolonged travel, hormonal therapy, current Covid infection,  so Ddimer was subsequently  ordered.  Low suspicion for PE; chest pain nonpleuritic in nature without shortness of breath, tachycardia, hypoxia.  Low suspicion for ACS given delta negative troponin, lack of acute ischemic changes on EKG.  Patient with heart score of 3. Doubt aortic dissection given lack of pulse deficits, neurologic deficits, hypertension; CP was described as radiating from chest to back seems more GI related than cardiovascular given correlation with consumption of medications.  Patient did note improvement of symptoms with nitroglycerin as well as aspirin.  Chest x-ray without signs of pulmonary vacular congestion/pleural effusions, patient without evidence of volume overload; low suspicion for ACS.  Doubt pneumonia.  Given patient's relief of chest pain with administration of nitroglycerin and lack of prior evaluation by specialist, we will refer for cardiology for further workup given extensive family history of cardiac complications.  Will also prescribe Paxlovid the patient's current COVID infection; patient not hypoxic  not tachypneic, without leukocytosis.  Strict return precautions were discussed at length.  Treatment plan discussed at length with patient and she knowledge understanding was agreeable to the plan.  Patient overall well-appearing, afebrile in no acute distress. Worrisome signs and symptoms were discussed with the patient, and the patient acknowledged understanding to return to the ED if noticed. Patient was stable upon discharge.          Final  Clinical Impression(s) / ED Diagnoses Final diagnoses:  Chest pain, unspecified type  COVID    Rx / DC Orders ED Discharge Orders          Ordered    nirmatrelvir & ritonavir (PAXLOVID, 300/100,) 20 x 150 MG & 10 x 100MG  TBPK  2 times daily        11/09/22 1448    nitroGLYCERIN (NITROSTAT) 0.4 MG SL tablet  Every 5 min PRN        11/09/22 1448    Ambulatory referral to Cardiology       Comments: If you have not heard from the Cardiology office within the next 72 hours please call [phone].   11/09/22 1448              Peter Garter, PA 11/09/22 1541    Gloris Manchester, MD 11/10/22 [phone]

## 2022-11-10 ENCOUNTER — Ambulatory Visit: Admitting: Adult Health

## 2022-11-10 ENCOUNTER — Telehealth: Admitting: Internal Medicine

## 2022-11-10 NOTE — Telephone Encounter (Signed)
Patient said she had to go to ER yesterday so they got her some medications

## 2022-11-11 ENCOUNTER — Ambulatory Visit: Admitting: Adult Health

## 2022-11-14 ENCOUNTER — Ambulatory Visit: Payer: Medicare Other | Admitting: Internal Medicine

## 2022-11-14 DIAGNOSIS — R7303 Prediabetes: Secondary | ICD-10-CM | POA: Diagnosis not present

## 2022-11-14 DIAGNOSIS — I10 Essential (primary) hypertension: Secondary | ICD-10-CM | POA: Diagnosis not present

## 2022-11-14 DIAGNOSIS — E782 Mixed hyperlipidemia: Secondary | ICD-10-CM | POA: Diagnosis not present

## 2022-11-14 DIAGNOSIS — E890 Postprocedural hypothyroidism: Secondary | ICD-10-CM | POA: Diagnosis not present

## 2022-11-14 DIAGNOSIS — E559 Vitamin D deficiency, unspecified: Secondary | ICD-10-CM | POA: Diagnosis not present

## 2022-11-19 ENCOUNTER — Ambulatory Visit (INDEPENDENT_AMBULATORY_CARE_PROVIDER_SITE_OTHER): Payer: Medicare Other | Admitting: Internal Medicine

## 2022-11-19 ENCOUNTER — Encounter: Payer: Self-pay | Admitting: Internal Medicine

## 2022-11-19 VITALS — BP 126/72 | HR 68 | Ht 67.0 in | Wt 203.2 lb

## 2022-11-19 DIAGNOSIS — R232 Flushing: Secondary | ICD-10-CM | POA: Diagnosis not present

## 2022-11-19 DIAGNOSIS — I10 Essential (primary) hypertension: Secondary | ICD-10-CM | POA: Diagnosis not present

## 2022-11-19 DIAGNOSIS — E782 Mixed hyperlipidemia: Secondary | ICD-10-CM

## 2022-11-19 DIAGNOSIS — F3341 Major depressive disorder, recurrent, in partial remission: Secondary | ICD-10-CM | POA: Diagnosis not present

## 2022-11-19 DIAGNOSIS — Z09 Encounter for follow-up examination after completed treatment for conditions other than malignant neoplasm: Secondary | ICD-10-CM

## 2022-11-19 DIAGNOSIS — E890 Postprocedural hypothyroidism: Secondary | ICD-10-CM | POA: Diagnosis not present

## 2022-11-19 MED ORDER — LOSARTAN POTASSIUM 50 MG PO TABS
50.0000 mg | ORAL_TABLET | Freq: Every day | ORAL | 1 refills | Status: DC
Start: 2022-11-19 — End: 2023-06-22

## 2022-11-19 MED ORDER — LEVOTHYROXINE SODIUM 75 MCG PO TABS: 75.0000 ug | ORAL_TABLET | Freq: Every day | ORAL | 1 refills | Status: DC

## 2022-11-19 NOTE — Assessment & Plan Note (Addendum)
Well-controlled, but states that she feels more down or depressed on cloudy days On Prozac, was better since moving to a new home - avoid changing dose as it seems like seasonal variation currently

## 2022-11-19 NOTE — Assessment & Plan Note (Signed)
Due to h/o multinodular goiter Lab Results  Component Value Date   TSH 1.840 11/14/2022   Takes Levothyroxine 75 mcg QD

## 2022-11-19 NOTE — Assessment & Plan Note (Addendum)
On Prozac Has Estradiol patch

## 2022-11-19 NOTE — Assessment & Plan Note (Signed)
BP Readings from Last 1 Encounters:  11/19/22 126/72   Well-controlled with losartan 50 mg QD Advised DASH diet and moderate exercise/walking, at least 150 mins/week Advised to check BP at home and contact if BP > 140/90 persistently

## 2022-11-19 NOTE — Assessment & Plan Note (Signed)
Lipid profile reviewed Advised to start statin, but she prefers to discuss with cardiologist

## 2022-11-19 NOTE — Patient Instructions (Signed)
Schedule your Medicare Annual Wellness Visit at checkout.  Please continue to take medications as prescribed.  Please continue to follow low carb diet and perform moderate exercise/walking at least 150 mins/week.

## 2022-11-19 NOTE — Progress Notes (Signed)
Established Patient Office Visit  Subjective:  Patient ID: Molly Knight, female    DOB: 29-Mar-1956  Age: 67 y.o. MRN: [id number]  CC:  Chief Complaint  Patient presents with   Hypertension    Four month follow up    Depression    Four month follow up     HPI Molly Knight is a 67 y.o. female with past medical history of postoperative hypothyroidism, hot flashes and depression who presents for f/u of her chronic medical conditions.  BP is wnl today.  She takes losartan 50 mg QD regularly.  Patient denies dizziness, chest pain, dyspnea or palpitations.  She recently had an ER visit for episode of chest pain, which had improved with aspirin and nitroglycerin.  Of note, she had COVID-19 infection at that time.  She was referred to cardiology from ER and is planned to see them tomorrow.  She has not had any episode of chest pain or dyspnea since then.  Hypothyroidism: She takes levothyroxine 75 mcg QD.  She denies chronic fatigue, recent change in weight or appetite, tremors or palpitations.  She has been taking Prozac and uses estradiol patch for hot flashes. She has mild hot flashes, but is manageable for now.  She has history of perforated tympanic membrane, which required surgery.  Her ear drainage has resolved now, but still has mild left sided ear discomfort.  Denies any fever or chills.  She is planned to see ENT specialist for tympanoplasty with graft.   Past Medical History:  Diagnosis Date   Depression    GERD (gastroesophageal reflux disease)    Hiatal hernia    Thyroid disease    Thyroid nodule 09/27/2015   Last Assessment & Plan:  Formatting of this note might be different from the original. Stable.  Her left lobe is normal in size on physical exam with no palpable nodule.    Past Surgical History:  Procedure Laterality Date   ABDOMINAL HYSTERECTOMY  2003   bladder tach     CESAREAN SECTION     COLONOSCOPY  04/2019   [place] - Dr. Roseanne Reno -  repeat Jan 2025 per patient.   ESOPHAGOGASTRODUODENOSCOPY (EGD) WITH PROPOFOL N/A 10/09/2022   Procedure: ESOPHAGOGASTRODUODENOSCOPY (EGD) WITH PROPOFOL;  Surgeon: Dolores Frame, MD;  [facility];  Service: Gastroenterology;  Laterality: N/A;  12:30pm;asa 2   SAVORY DILATION  10/09/2022   Procedure: SAVORY DILATION;  Surgeon: Marguerita Merles, Reuel Boom, MD;  Location: [facility];  Service: Gastroenterology;;   SHOULDER SURGERY Bilateral 02/2019   THYROIDECTOMY, PARTIAL  10/2016    Family History  Problem Relation Age of Onset   Aortic aneurysm Mother    Renal Disease Father    Colon cancer Sister    Throat cancer Brother    Heart attack Sister    Other Sister        tahycardia   Other Brother        heart valve surgery    Social History   Socioeconomic History   Marital status: Married    Spouse name: Not on file   Number of children: Not on file   Years of education: Not on file   Highest education level: Associate degree: academic program  Occupational History   Not on file  Tobacco Use   Smoking status: Never    Passive exposure: Never   Smokeless tobacco: Never  Vaping Use   Vaping status: Never Used  Substance and Sexual Activity   Alcohol use:  Not Currently   Drug use: Never   Sexual activity: Not Currently    Birth control/protection: Surgical    Comment: hyst  Other Topics Concern   Not on file  Social History Narrative   Not on file   Social Determinants of Health   Financial Resource Strain: Low Risk  (11/15/2022)   Overall Financial Resource Strain (CARDIA)    Difficulty of Paying Living Expenses: Not hard at all  Food Insecurity: No Food Insecurity (11/15/2022)   Hunger Vital Sign    Worried About Running Out of Food in the Last Year: Never true    Ran Out of Food in the Last Year: Never true  Transportation Needs: No Transportation Needs (11/15/2022)   PRAPARE - Administrator, Civil Service (Medical): No     Lack of Transportation (Non-Medical): No  Physical Activity: Insufficiently Active (11/15/2022)   Exercise Vital Sign    Days of Exercise per Week: 3 days    Minutes of Exercise per Session: 30 min  Stress: No Stress Concern Present (11/15/2022)   [employer] of Occupational Health - Occupational Stress Questionnaire    Feeling of Stress : Not at all  Social Connections: Moderately Isolated (11/15/2022)   Social Connection and Isolation Panel [NHANES]    Frequency of Communication with Friends and Family: More than three times a week    Frequency of Social Gatherings with Friends and Family: Once a week    Attends Religious Services: Never    Database administrator or Organizations: No    Attends Engineer, structural: Not on file    Marital Status: Married  Catering manager Violence: Not At Risk (08/27/2020)   Humiliation, Afraid, Rape, and Kick questionnaire    Fear of Current or Ex-Partner: No    Emotionally Abused: No    Physically Abused: No    Sexually Abused: No    Outpatient Medications Prior to Visit  Medication Sig Dispense Refill   Calcium Citrate-Vitamin D (CALCIUM + D PO) Take 1 tablet by mouth 2 (two) times daily.     estradiol (VIVELLE-DOT) 0.05 MG/24HR patch Place 1 patch (0.05 mg total) onto the skin 2 (two) times a week. 24 patch 3   famotidine (PEPCID) 20 MG tablet TAKE 1 TABLET(20 MG) BY MOUTH AT BEDTIME 90 tablet 0   FLUoxetine (PROZAC) 20 MG capsule TAKE 1 CAPSULE(20 MG) BY MOUTH DAILY 30 capsule 3   metroNIDAZOLE (METROGEL) 1 % gel Apply 1 Application topically daily as needed (rosacea).     Multiple Vitamins-Minerals (MULTI VITAMIN/MINERALS) TABS Take 1 tablet by mouth daily.     nitroGLYCERIN (NITROSTAT) 0.4 MG SL tablet Place 1 tablet (0.4 mg total) under the tongue every 5 (five) minutes as needed for chest pain. 30 tablet 0   pantoprazole (PROTONIX) 40 MG tablet Take 1 tablet (40 mg total) by mouth daily. 30 tablet 3   acetaminophen (TYLENOL) 500  MG tablet Take 500 mg by mouth every 6 (six) hours as needed for moderate pain.     cetirizine (ZYRTEC) 10 MG tablet Take 10 mg by mouth daily.     ciprofloxacin-dexamethasone (CIPRODEX) OTIC suspension Place 4 drops into the left ear 2 (two) times daily as needed (ear infections).     levothyroxine (SYNTHROID) 75 MCG tablet TAKE 1 TABLET(75 MCG) BY MOUTH DAILY BEFORE BREAKFAST 90 tablet 1   losartan (COZAAR) 50 MG tablet Take 1 tablet (50 mg total) by mouth daily. 90 tablet 1   magic  mouthwash (lidocaine, diphenhydrAMINE, alum & mag hydroxide) suspension Swish and swallow 5 mLs 4 (four) times daily as needed for mouth pain. 360 mL 0   meclizine (ANTIVERT) 25 MG tablet Take 1 tablet (25 mg total) by mouth 3 (three) times daily as needed for dizziness. 30 tablet 1   No facility-administered medications prior to visit.    No Known Allergies  ROS Review of Systems  Constitutional:  Negative for chills and fever.  HENT:  Negative for congestion, ear discharge, sinus pressure, sinus pain and sore throat.   Eyes:  Negative for pain and discharge.  Respiratory:  Negative for cough and shortness of breath.   Cardiovascular:  Negative for chest pain and palpitations.  Gastrointestinal:  Negative for abdominal pain, diarrhea, nausea and vomiting.  Endocrine: Negative for polydipsia and polyuria.  Genitourinary:  Negative for dysuria and hematuria.  Musculoskeletal:  Positive for arthralgias and back pain. Negative for neck pain and neck stiffness.  Skin:  Negative for rash.  Neurological:  Negative for dizziness and weakness.  Psychiatric/Behavioral:  Negative for agitation and behavioral problems.       Objective:    Physical Exam Constitutional:      General: She is not in acute distress.    Appearance: She is not diaphoretic.  HENT:     Head: Normocephalic and atraumatic.     Nose: No congestion.     Mouth/Throat:     Mouth: Mucous membranes are moist.     Pharynx: No posterior  oropharyngeal erythema.  Eyes:     Extraocular Movements: Extraocular movements intact.  Cardiovascular:     Rate and Rhythm: Normal rate and regular rhythm.     Pulses: Normal pulses.     Heart sounds: Normal heart sounds. No murmur heard. Pulmonary:     Breath sounds: Normal breath sounds. No wheezing or rales.  Musculoskeletal:     Right lower leg: No edema.     Left lower leg: No edema.  Skin:    General: Skin is warm.     Findings: No rash.  Neurological:     General: No focal deficit present.     Mental Status: She is alert and oriented to person, place, and time.  Psychiatric:        Mood and Affect: Mood normal.        Behavior: Behavior normal.     BP 126/72 (BP Location: Right Arm, Patient Position: Sitting, Cuff Size: Normal)   Pulse 68   Ht 5\' 7"  (1.702 m)   Wt 203 lb 3.2 oz (92.2 kg)   LMP  (LMP Unknown)   SpO2 95%   BMI 31.83 kg/m  Wt Readings from Last 3 Encounters:  11/19/22 203 lb 3.2 oz (92.2 kg)  11/09/22 198 lb (89.8 kg)  10/17/22 205 lb (93 kg)    Lab Results  Component Value Date   TSH 1.840 11/14/2022   Lab Results  Component Value Date   WBC 6.7 11/14/2022   HGB 13.1 11/14/2022   HCT 41.3 11/14/2022   MCV 93 11/14/2022   PLT 328 11/14/2022   Lab Results  Component Value Date   NA 136 11/14/2022   K 4.5 11/14/2022   CO2 25 11/14/2022   GLUCOSE 89 11/14/2022   BUN 10 11/14/2022   CREATININE 0.74 11/14/2022   BILITOT 0.3 11/14/2022   ALKPHOS 81 11/14/2022   AST 20 11/14/2022   ALT 13 11/14/2022   PROT 6.3 11/14/2022   ALBUMIN 4.1 11/14/2022  CALCIUM 9.4 11/14/2022   ANIONGAP 7 11/09/2022   EGFR 90 11/14/2022   Lab Results  Component Value Date   CHOL 219 (H) 11/14/2022   Lab Results  Component Value Date   HDL 65 11/14/2022   Lab Results  Component Value Date   LDLCALC 138 (H) 11/14/2022   Lab Results  Component Value Date   TRIG 88 11/14/2022   Lab Results  Component Value Date   CHOLHDL 3.4 11/14/2022    Lab Results  Component Value Date   HGBA1C 6.2 (H) 11/14/2022      Assessment & Plan:   Problem List Items Addressed This Visit       Cardiovascular and Mediastinum   Hot flashes    On Prozac Has Estradiol patch      Relevant Medications   losartan (COZAAR) 50 MG tablet   Essential hypertension - Primary    BP Readings from Last 1 Encounters:  11/19/22 126/72   Well-controlled with losartan 50 mg QD Advised DASH diet and moderate exercise/walking, at least 150 mins/week Advised to check BP at home and contact if BP > 140/90 persistently      Relevant Medications   losartan (COZAAR) 50 MG tablet     Endocrine   Postoperative hypothyroidism    Due to h/o multinodular goiter Lab Results  Component Value Date   TSH 1.840 11/14/2022   Takes Levothyroxine 75 mcg QD      Relevant Medications   levothyroxine (SYNTHROID) 75 MCG tablet     Other   MDD (major depressive disorder), recurrent, in partial remission (HCC)    Well-controlled, but states that she feels more down or depressed on cloudy days On Prozac, was better since moving to a new home - avoid changing dose as it seems like seasonal variation currently      Mixed hyperlipidemia    Lipid profile reviewed Advised to start statin, but she prefers to discuss with cardiologist      Relevant Medications   losartan (COZAAR) 50 MG tablet   Encounter for examination following treatment at hospital    Recent ER visit chart reviewed, including blood tests Had COVID-19 infection, completed Paxlovid Had an episode of chest pain, has been referred to cardiology from ER       Meds ordered this encounter  Medications   losartan (COZAAR) 50 MG tablet    Sig: Take 1 tablet (50 mg total) by mouth daily.    Dispense:  90 tablet    Refill:  1   levothyroxine (SYNTHROID) 75 MCG tablet    Sig: Take 1 tablet (75 mcg total) by mouth daily before breakfast.    Dispense:  90 tablet    Refill:  1    Follow-up:  Return in about 4 months (around 03/21/2023) for HTN and MDD.    Anabel Halon, MD

## 2022-11-19 NOTE — Assessment & Plan Note (Addendum)
Recent ER visit chart reviewed, including blood tests Had COVID-19 infection, completed Paxlovid Had an episode of chest pain, has been referred to cardiology from ER

## 2022-11-20 ENCOUNTER — Ambulatory Visit: Payer: Federal, State, Local not specified - PPO | Attending: Internal Medicine | Admitting: Internal Medicine

## 2022-11-20 ENCOUNTER — Encounter: Payer: Self-pay | Admitting: Internal Medicine

## 2022-11-20 VITALS — BP 115/80 | HR 60 | Ht 67.0 in | Wt 203.4 lb

## 2022-11-20 DIAGNOSIS — R0789 Other chest pain: Secondary | ICD-10-CM | POA: Insufficient documentation

## 2022-11-20 MED ORDER — ROSUVASTATIN CALCIUM 10 MG PO TABS: 10.0000 mg | ORAL_TABLET | Freq: Every day | ORAL | 1 refills | Status: DC

## 2022-11-20 NOTE — Progress Notes (Signed)
Cardiology Office Note  Date: 11/20/2022   ID: Molly Knight, DOB 05/03/1955, MRN [id number]  PCP:  Anabel Halon, MD  Cardiologist:  None Electrophysiologist:  None   Reason for Office Visit: Chest pain evaluation   History of Present Illness: Molly Knight is a 67 y.o. female known to have GERD was referred to cardiology clinic for evaluation of chest pain.  Patient had ER visit on 11/09/2022 for evaluation of chest pressure radiating to her back. High-sensitivity troponin was within normal limits. EKG showed NSR, no ischemia. She also tested positive for COVID-19 infection a few days prior to her ER visit on 11/09/2022. Family history is positive for a sister who had a heart attack as well as mother who had a thoracic aortic aneurysm but no one had PCI or CABG.  No recurrence of chest pressure since ER visit in 7/24.  No prior history of MI/PCI/CABG.  No angina, DOE, orthopnea, PND, leg swelling.  No dizziness, syncope, palpitations.  Denies smoking cigarettes.   Past Medical History:  Diagnosis Date   Depression    GERD (gastroesophageal reflux disease)    Hiatal hernia    Thyroid disease    Thyroid nodule 09/27/2015   Last Assessment & Plan:  Formatting of this note might be different from the original. Stable.  Her left lobe is normal in size on physical exam with no palpable nodule.    Past Surgical History:  Procedure Laterality Date   ABDOMINAL HYSTERECTOMY  2003   bladder tach     CESAREAN SECTION     COLONOSCOPY  04/2019   [place] - Dr. Roseanne Reno - repeat Jan 2025 per patient.   ESOPHAGOGASTRODUODENOSCOPY (EGD) WITH PROPOFOL N/A 10/09/2022   Procedure: ESOPHAGOGASTRODUODENOSCOPY (EGD) WITH PROPOFOL;  Surgeon: Dolores Frame, MD;  [facility]: [facility];  Service: Gastroenterology;  Laterality: N/A;  12:30pm;asa 2   SAVORY DILATION  10/09/2022   Procedure: SAVORY DILATION;  Surgeon: Marguerita Merles, Reuel Boom, MD;  Location: [facility];   Service: Gastroenterology;;   SHOULDER SURGERY Bilateral 02/2019   THYROIDECTOMY, PARTIAL  10/2016    Current Outpatient Medications  Medication Sig Dispense Refill   Calcium Citrate-Vitamin D (CALCIUM + D PO) Take 1 tablet by mouth 2 (two) times daily.     estradiol (VIVELLE-DOT) 0.05 MG/24HR patch Place 1 patch (0.05 mg total) onto the skin 2 (two) times a week. 24 patch 3   famotidine (PEPCID) 20 MG tablet TAKE 1 TABLET(20 MG) BY MOUTH AT BEDTIME 90 tablet 0   FLUoxetine (PROZAC) 20 MG capsule TAKE 1 CAPSULE(20 MG) BY MOUTH DAILY 30 capsule 3   levothyroxine (SYNTHROID) 75 MCG tablet Take 1 tablet (75 mcg total) by mouth daily before breakfast. 90 tablet 1   losartan (COZAAR) 50 MG tablet Take 1 tablet (50 mg total) by mouth daily. 90 tablet 1   metroNIDAZOLE (METROGEL) 1 % gel Apply 1 Application topically daily as needed (rosacea).     Multiple Vitamins-Minerals (MULTI VITAMIN/MINERALS) TABS Take 1 tablet by mouth daily.     nitroGLYCERIN (NITROSTAT) 0.4 MG SL tablet Place 1 tablet (0.4 mg total) under the tongue every 5 (five) minutes as needed for chest pain. 30 tablet 0   pantoprazole (PROTONIX) 40 MG tablet Take 1 tablet (40 mg total) by mouth daily. 30 tablet 3   No current facility-administered medications for this visit.   Allergies:  Patient has no known allergies.   Social History: The patient  reports that she has never  smoked. She has never been exposed to tobacco smoke. She has never used smokeless tobacco. She reports that she does not currently use alcohol. She reports that she does not use drugs.   Family History: The patient's family history includes Aortic aneurysm in her mother; Colon cancer in her sister; Heart attack in her sister; Other in her brother and sister; Renal Disease in her father; Throat cancer in her brother.   ROS:  Please see the history of present illness. Otherwise, complete review of systems is positive for none  All other systems are reviewed and  negative.   Physical Exam: VS:  LMP  (LMP Unknown) , BMI There is no height or weight on file to calculate BMI.  Wt Readings from Last 3 Encounters:  11/19/22 203 lb 3.2 oz (92.2 kg)  11/09/22 198 lb (89.8 kg)  10/17/22 205 lb (93 kg)    General: Patient appears comfortable at rest. HEENT: Conjunctiva and lids normal, oropharynx clear with moist mucosa. Neck: Supple, no elevated JVP or carotid bruits, no thyromegaly. Lungs: Clear to auscultation, nonlabored breathing at rest. Cardiac: Regular rate and rhythm, no S3 or significant systolic murmur, no pericardial rub. Abdomen: Soft, nontender, no hepatomegaly, bowel sounds present, no guarding or rebound. Extremities: No pitting edema, distal pulses 2+. Skin: Warm and dry. Musculoskeletal: No kyphosis. Neuropsychiatric: Alert and oriented x3, affect grossly appropriate.  Recent Labwork: 11/09/2022: Magnesium 1.9 11/14/2022: ALT 13; AST 20; BUN 10; Creatinine, Ser 0.74; Hemoglobin 13.1; Platelets 328; Potassium 4.5; Sodium 136; TSH 1.840     Component Value Date/Time   CHOL 219 (H) 11/14/2022 0930   TRIG 88 11/14/2022 0930   HDL 65 11/14/2022 0930   CHOLHDL 3.4 11/14/2022 0930   LDLCALC 138 (H) 11/14/2022 0930    Other Studies Reviewed Today: Echocardiogram from 2021 LVEF normal No valvular heart disease  Assessment and Plan:  # Chest pressure -Patient had 1 episode of substernal chest pressure radiating to her back and lasted for 30 minutes. Tested positive for COVID-19 a few days prior to the chest pressure episode.  No recurrence since then.  Cardiac risk factors include HTN and advanced age 12 years. Obtain treadmill exercise Myoview and 2D echocardiogram (mother had thoracic aortic aneurysm).  # HLD, not at goal -Lipid panel from 7/24 reviewed, LDL 138 (elevated) and total cholesterol 219, elevated. Normal TG levels. TSH within normal limits.  Discussed about improvement in lipid levels with weight loss.  Patient opted for  medication management as she was not able to lose weight.  Will start rosuvastatin 10 mg nightly.  Goal LDL less than 100.  Side effect included myalgias and patient is instructed to take the medication every other day if she experiences myalgias and reach out to her PCP.  # HTN, controlled -Continue losartan 50 mg once daily.  I have spent a total of 45 minutes with patient reviewing chart, EKGs, labs and examining patient as well as establishing an assessment and plan that was discussed with the patient.  > 50% of time was spent in direct patient care.    Medication Adjustments/Labs and Tests Ordered: Current medicines are reviewed at length with the patient today.  Concerns regarding medicines are outlined above.   Tests Ordered: Orders Placed This Encounter  Procedures   NM Myocar Multi W/Spect W/Wall Motion / EF   ECHOCARDIOGRAM COMPLETE     Medication Changes: Meds ordered this encounter  Medications   rosuvastatin (CRESTOR) 10 MG tablet    Sig: Take 1  tablet (10 mg total) by mouth at bedtime.    Dispense:  90 tablet    Refill:  1    11/20/2022-New      Disposition:  Follow up  pending results  Signed Marisa Hufstetler Verne Spurr, MD, 11/20/2022 8:50 AM    [facility] [address] [place], [place], [place] [address]

## 2022-11-20 NOTE — Patient Instructions (Signed)
Medication Instructions:  Your physician has recommended you make the following change in your medication:  Start taking Rosuvastatin 10 mg at daily at bedtime  Labwork: None  Testing/Procedures: Your physician has requested that you have an echocardiogram. Echocardiography is a painless test that uses sound waves to create images of your heart. It provides your doctor with information about the size and shape of your heart and how well your heart's chambers and valves are working. This procedure takes approximately one hour. There are no restrictions for this procedure. Please do NOT wear cologne, perfume, aftershave, or lotions (deodorant is allowed). Please arrive 15 minutes prior to your appointment time.  Your physician has requested that you have en exercise stress myoview. For further information please visit [email or web address] Please follow instruction sheet, as given.   Follow-Up: Your physician recommends that you schedule a follow-up appointment in: Pending Results  Any Other Special Instructions Will Be Listed Below (If Applicable).  If you need a refill on your cardiac medications before your next appointment, please call your pharmacy.

## 2022-11-25 ENCOUNTER — Encounter (HOSPITAL_COMMUNITY)
Admission: RE | Admit: 2022-11-25 | Discharge: 2022-11-25 | Disposition: A | Discharge status: Home / Self Care | Payer: Medicare Other | Source: Ambulatory Visit | Attending: Internal Medicine | Admitting: Internal Medicine

## 2022-11-25 ENCOUNTER — Ambulatory Visit (HOSPITAL_COMMUNITY)
Admission: RE | Admit: 2022-11-25 | Discharge: 2022-11-25 | Disposition: A | Discharge status: Home / Self Care | Payer: Federal, State, Local not specified - PPO | Source: Ambulatory Visit | Attending: Internal Medicine | Admitting: Internal Medicine

## 2022-11-25 DIAGNOSIS — R0789 Other chest pain: Secondary | ICD-10-CM | POA: Insufficient documentation

## 2022-11-25 LAB — NM MYOCAR MULTI W/SPECT W/WALL MOTION / EF
Angina Index: 0
Base ST Depression (mm): 0 mm
Duke Treadmill Score: 5
Estimated workload: 7
Exercise duration (min): 4 min
Exercise duration (sec): 52 s
LV dias vol: 84 mL (ref 46–106)
LV sys vol: 35 mL
MPHR: 155 {beats}/min
Nuc Stress EF: 58 %
Peak HR: 146 {beats}/min
Percent HR: 94 %
RATE: 0.3
RPE: 19
Rest HR: 57 {beats}/min
Rest Nuclear Isotope Dose: 10.1 mCi
SDS: 0
SRS: 0
SSS: 0
ST Depression (mm): 0 mm
Stress Nuclear Isotope Dose: 30 mCi
TID: 1.11

## 2022-11-25 MED ORDER — TECHNETIUM TC 99M TETROFOSMIN IV KIT
10.1000 | PACK | Freq: Once | INTRAVENOUS | Status: AC | PRN
  Administered 2022-11-25: 10.1 via INTRAVENOUS

## 2022-11-25 MED ORDER — REGADENOSON 0.4 MG/5ML IV SOLN
INTRAVENOUS | Status: AC
  Filled 2022-11-25: qty 5

## 2022-11-25 MED ORDER — SODIUM CHLORIDE FLUSH 0.9 % IV SOLN
INTRAVENOUS | Status: AC
  Administered 2022-11-25: 10 mL via INTRAVENOUS
  Filled 2022-11-25: qty 10

## 2022-11-25 MED ORDER — TECHNETIUM TC 99M TETROFOSMIN IV KIT
30.0000 | PACK | Freq: Once | INTRAVENOUS | Status: AC | PRN
  Administered 2022-11-25: 30 via INTRAVENOUS

## 2022-11-27 ENCOUNTER — Other Ambulatory Visit: Payer: Federal, State, Local not specified - PPO

## 2022-12-01 ENCOUNTER — Encounter (HOSPITAL_BASED_OUTPATIENT_CLINIC_OR_DEPARTMENT_OTHER): Payer: Self-pay

## 2022-12-01 ENCOUNTER — Ambulatory Visit (HOSPITAL_BASED_OUTPATIENT_CLINIC_OR_DEPARTMENT_OTHER): Admit: 2022-12-01 | Admitting: Otolaryngology

## 2022-12-01 SURGERY — TYMPANOPLASTY WITH GRAFT
Anesthesia: General | Laterality: Left

## 2022-12-03 ENCOUNTER — Ambulatory Visit (INDEPENDENT_AMBULATORY_CARE_PROVIDER_SITE_OTHER): Payer: Medicare Other | Admitting: "Endocrinology

## 2022-12-03 ENCOUNTER — Other Ambulatory Visit: Payer: Self-pay | Admitting: Internal Medicine

## 2022-12-03 ENCOUNTER — Encounter: Payer: Self-pay | Admitting: "Endocrinology

## 2022-12-03 VITALS — BP 128/80 | HR 60 | Ht 67.0 in | Wt 202.6 lb

## 2022-12-03 DIAGNOSIS — R7303 Prediabetes: Secondary | ICD-10-CM | POA: Diagnosis not present

## 2022-12-03 DIAGNOSIS — E782 Mixed hyperlipidemia: Secondary | ICD-10-CM | POA: Diagnosis not present

## 2022-12-03 DIAGNOSIS — E890 Postprocedural hypothyroidism: Secondary | ICD-10-CM

## 2022-12-03 NOTE — Patient Instructions (Signed)

## 2022-12-03 NOTE — Progress Notes (Signed)
Endocrinology Consult Note                                            12/03/2022, 3:09 PM   Subjective:    Patient ID: Molly Knight, female    DOB: [birth year], PCP Anabel Halon, MD   Past Medical History:  Diagnosis Date   Depression    GERD (gastroesophageal reflux disease)    Hiatal hernia    Thyroid disease    Thyroid nodule 09/27/2015   Last Assessment & Plan:  Formatting of this note might be different from the original. Stable.  Her left lobe is normal in size on physical exam with no palpable nodule.   Past Surgical History:  Procedure Laterality Date   ABDOMINAL HYSTERECTOMY  2003   bladder tach     CESAREAN SECTION     COLONOSCOPY  04/2019   [place] - Dr. Roseanne Reno - repeat Jan 2025 per patient.   ESOPHAGOGASTRODUODENOSCOPY (EGD) WITH PROPOFOL N/A 10/09/2022   Procedure: ESOPHAGOGASTRODUODENOSCOPY (EGD) WITH PROPOFOL;  Surgeon: Dolores Frame, MD;  Location: [facility];  Service: Gastroenterology;  Laterality: N/A;  12:30pm;asa 2   SAVORY DILATION  10/09/2022   Procedure: SAVORY DILATION;  Surgeon: Marguerita Merles, Reuel Boom, MD;  Location: [facility];  Service: Gastroenterology;;   SHOULDER SURGERY Bilateral 02/2019   THYROIDECTOMY, PARTIAL  10/2016   Social History   Socioeconomic History   Marital status: Married    Spouse name: Not on file   Number of children: Not on file   Years of education: Not on file   Highest education level: Associate degree: academic program  Occupational History   Not on file  Tobacco Use   Smoking status: Never    Passive exposure: Never   Smokeless tobacco: Never  Vaping Use   Vaping status: Never Used  Substance and Sexual Activity   Alcohol use: Not Currently   Drug use: Never   Sexual activity: Not Currently    Birth control/protection: Surgical    Comment: hyst  Other Topics Concern   Not on file  Social History Narrative   Not on file   Social Determinants of Health    Financial Resource Strain: Low Risk  (11/15/2022)   Overall Financial Resource Strain (CARDIA)    Difficulty of Paying Living Expenses: Not hard at all  Food Insecurity: No Food Insecurity (11/15/2022)   Hunger Vital Sign    Worried About Running Out of Food in the Last Year: Never true    Ran Out of Food in the Last Year: Never true  Transportation Needs: No Transportation Needs (11/15/2022)   PRAPARE - Administrator, Civil Service (Medical): No    Lack of Transportation (Non-Medical): No  Physical Activity: Insufficiently Active (11/15/2022)   Exercise Vital Sign    Days of Exercise per Week: 3 days    Minutes of Exercise per Session: 30 min  Stress: No Stress Concern Present (11/15/2022)   [employer] of Occupational Health - Occupational Stress Questionnaire    Feeling of Stress : Not at all  Social Connections: Moderately Isolated (11/15/2022)   Social Connection and Isolation Panel [NHANES]    Frequency of Communication with Friends and Family: More than three times a week    Frequency of Social Gatherings with Friends and Family: Once a week  Attends Religious Services: Never    Active Member of Clubs or Organizations: No    Attends Engineer, structural: Not on file    Marital Status: Married   Family History  Problem Relation Age of Onset   Hypertension Mother    Aortic aneurysm Mother    Renal Disease Father    Colon cancer Sister    Heart attack Sister    Other Sister        tahycardia   Throat cancer Brother    Other Brother        heart valve surgery   Outpatient Encounter Medications as of 12/03/2022  Medication Sig   Lactobacillus (PROBIOTIC ACIDOPHILUS PO) Take 1 tablet by mouth daily.   meclizine (ANTIVERT) 25 MG tablet Take 25 mg by mouth 3 (three) times daily as needed for dizziness.   Calcium Citrate-Vitamin D (CALCIUM + D PO) Take 1 tablet by mouth 2 (two) times daily.   estradiol (VIVELLE-DOT) 0.05 MG/24HR patch Place 1 patch  (0.05 mg total) onto the skin 2 (two) times a week.   famotidine (PEPCID) 20 MG tablet TAKE 1 TABLET(20 MG) BY MOUTH AT BEDTIME   FLUoxetine (PROZAC) 20 MG capsule TAKE 1 CAPSULE(20 MG) BY MOUTH DAILY   levothyroxine (SYNTHROID) 75 MCG tablet TAKE 1 TABLET(75 MCG) BY MOUTH DAILY BEFORE BREAKFAST   losartan (COZAAR) 50 MG tablet Take 1 tablet (50 mg total) by mouth daily.   metroNIDAZOLE (METROGEL) 1 % gel Apply 1 Application topically daily as needed (rosacea).   Multiple Vitamins-Minerals (MULTI VITAMIN/MINERALS) TABS Take 1 tablet by mouth daily.   nitroGLYCERIN (NITROSTAT) 0.4 MG SL tablet Place 1 tablet (0.4 mg total) under the tongue every 5 (five) minutes as needed for chest pain.   pantoprazole (PROTONIX) 40 MG tablet Take 1 tablet (40 mg total) by mouth daily.   rosuvastatin (CRESTOR) 10 MG tablet Take 1 tablet (10 mg total) by mouth at bedtime.   No facility-administered encounter medications on file as of 12/03/2022.   ALLERGIES: No Known Allergies  VACCINATION STATUS: Immunization History  Administered Date(s) Administered   Influenza,inj,Quad PF,6+ Mos 01/23/2019, 01/10/2020, 12/18/2020, 01/09/2022   Moderna Covid-19 Vaccine Bivalent Booster 37yrs & up 03/01/2021   Moderna Sars-Covid-2 Vaccination 05/17/2019, 05/20/2019, 06/17/2019, 04/04/2020   PNEUMOCOCCAL CONJUGATE-20 05/13/2022   Tdap 03/24/2014   Zoster Recombinant(Shingrix) 08/07/2017, 10/16/2017    HPI Molly Knight is 67 y.o. female who presents today with a medical history as above. she is being seen in consultation for postsurgical hypothyroidism requested by Anabel Halon, MD.  History is obtained directly from the patient as well as records she brought in during the concert.  She is known to have multinodular goiter prior to 2018 when she underwent right thyroid Lobectomy in [place].  Her surgical report shows benign hyperplastic follicular nodule with partial cystic degeneration and fibrosis.  She does  not recall if there was any thyroid ultrasound postsurgery. She was put on thyroid hormone replacement subsequently.  She took various doses over the years, currently 75 mcg of levothyroxine p.o. daily with some minor inconsistency.  Her most recent thyroid function test in July was consistent with appropriate replacement.  She denies dysphagia, shortness breath, nor voice change. She denies any family history of thyroid malignancy.  Her other medical problems include hyperlipidemia, prediabetes.  She is on Crestor 10 mg p.o. nightly.  She is on ongoing calcium/vitamin D supplement. She denies palpitations, tremors.  She reports mild heat intolerance. She is not following any  particular dietary program for exercise program.   Review of Systems  Constitutional: + Progressively gaining weight,  no fatigue, no subjective hyperthermia, no subjective hypothermia Eyes: no blurry vision, no xerophthalmia ENT: no sore throat, no nodules palpated in throat, no dysphagia/odynophagia, no hoarseness Cardiovascular: no Chest Pain, no Shortness of Breath, no palpitations, no leg swelling Respiratory: no cough, no shortness of breath Gastrointestinal: no Nausea/Vomiting/Diarhhea Musculoskeletal: no muscle/joint aches Skin: no rashes Neurological: no tremors, no numbness, no tingling, no dizziness Psychiatric: no depression, no anxiety  Objective:       12/03/2022    1:05 PM 11/20/2022    8:55 AM 11/19/2022    3:25 PM  Vitals with BMI  Height 5\' 7"  5\' 7"  5\' 7"   Weight 202 lbs 10 oz 203 lbs 6 oz 203 lbs 3 oz  BMI 31.72 31.85 31.82  Systolic 128 115 098  Diastolic 80 80 72  Pulse 60 60 68    BP 128/80   Pulse 60   Ht 5\' 7"  (1.702 m)   Wt 202 lb 9.6 oz (91.9 kg)   LMP  (LMP Unknown)   BMI 31.73 kg/m   Wt Readings from Last 3 Encounters:  12/03/22 202 lb 9.6 oz (91.9 kg)  11/20/22 203 lb 6.4 oz (92.3 kg)  11/19/22 203 lb 3.2 oz (92.2 kg)    Physical Exam  Constitutional:  Body mass index is  31.73 kg/m.,  not in acute distress, normal state of mind Eyes: PERRLA, EOMI, no exophthalmos ENT: moist mucous membranes, + scar from her remote past surgery, no gross thyromegaly, no gross cervical lymphadenopathy Cardiovascular: normal precordial activity, Regular Rate and Rhythm, no Murmur/Rubs/Gallops Respiratory:  adequate breathing efforts, no gross chest deformity, Clear to auscultation bilaterally Gastrointestinal: abdomen soft, Non -tender, No distension, Bowel Sounds present, no gross organomegaly Musculoskeletal: no gross deformities, strength intact in all four extremities, no peripheral edema Skin: moist, warm, no rashes Neurological: no tremor with outstretched hands, Deep tendon reflexes normal in bilateral lower extremities.  CMP ( most recent) CMP     Component Value Date/Time   NA 136 11/14/2022 0930   K 4.5 11/14/2022 0930   CL 98 11/14/2022 0930   CO2 25 11/14/2022 0930   GLUCOSE 89 11/14/2022 0930   GLUCOSE 145 (H) 11/09/2022 1119   BUN 10 11/14/2022 0930   CREATININE 0.74 11/14/2022 0930   CALCIUM 9.4 11/14/2022 0930   PROT 6.3 11/14/2022 0930   ALBUMIN 4.1 11/14/2022 0930   AST 20 11/14/2022 0930   ALT 13 11/14/2022 0930   ALKPHOS 81 11/14/2022 0930   BILITOT 0.3 11/14/2022 0930   EGFR 90 11/14/2022 0930   GFRNONAA >60 11/09/2022 1119     Diabetic Labs (most recent): Lab Results  Component Value Date   HGBA1C 6.2 (H) 11/14/2022   HGBA1C 5.7 (H) 06/10/2021   HGBA1C 5.4 05/01/2020     Lipid Panel ( most recent) Lipid Panel     Component Value Date/Time   CHOL 219 (H) 11/14/2022 0930   TRIG 88 11/14/2022 0930   HDL 65 11/14/2022 0930   CHOLHDL 3.4 11/14/2022 0930   LDLCALC 138 (H) 11/14/2022 0930   LABVLDL 16 11/14/2022 0930      Lab Results  Component Value Date   TSH 1.840 11/14/2022   TSH 1.920 01/24/2022   TSH 2.110 06/10/2021   TSH 1.570 09/13/2020   TSH 2.030 05/01/2020   FREET4 1.33 11/14/2022   FREET4 1.26 01/24/2022    FREET4 1.08 06/10/2021  Assessment & Plan:   1. Postoperative hypothyroidism 2. Mixed hyperlipidemia 3. Prediabetes  - Molly Knight  is being seen at a kind request of Anabel Halon, MD. - I have reviewed her available  records and clinically evaluated the patient. - Based on these reviews, she has a right thyroid lobectomy in 2018 with subsequent postsurgical hypothyroidism for which levothyroxine 75 mcg p.o. daily before breakfast. Her most recent thyroid function tests are consistent with appropriate replacement.  - We discussed about the correct intake of her thyroid hormone, on empty stomach at fasting, with water, separated by at least 30 minutes from breakfast and other medications,  and separated by more than 4 hours from calcium, iron, multivitamins, acid reflux medications (PPIs). -Patient is made aware of the fact that thyroid hormone replacement is needed for life, dose to be adjusted by periodic monitoring of thyroid function tests.  -Regarding her past history of multinodular goiter, she is offered repeat/surveillance thyroid ultrasound which she will complete it before her next visit. In light of her metabolic dysfunction indicated by hyperlipidemia and prediabetes, she is advised to continue Crestor 10 mg p.o. nightly. - she acknowledges that there is a room for improvement in her food and drink choices. - Suggestion is made for her to avoid simple carbohydrates  from her diet including Cakes, Sweet Desserts, Ice Cream, Soda (diet and regular), Sweet Tea, Candies, Chips, Cookies, Store Bought Juices, Alcohol , Artificial Sweeteners,  Coffee Creamer, and "Sugar-free" Products, Lemonade. This will help patient to have more stable blood glucose profile and potentially avoid unintended weight gain. He   - I did not initiate any new prescriptions today. - she is advised to maintain close follow up with Anabel Halon, MD for primary care needs.   -Thank you for  involving me in the care of this pleasant patient.  Time spent with the patient: 46  minutes, of which >50% was spent in  counseling her about her hypothyroidism, hyperlipidemia, prediabetes and the rest in obtaining information about her symptoms, reviewing her previous labs/studies ( including abstractions from other facilities),  evaluations, and treatments,  and developing a plan to confirm diagnosis and long term treatment based on the latest standards of care/guidelines; and documenting her care.  Molly Knight participated in the discussions, expressed understanding, and voiced agreement with the above plans.  All questions were answered to her satisfaction. she is encouraged to contact clinic should she have any questions or concerns prior to her return visit.  Follow up plan: Return for F/U with Pre-visit Labs, Thyroid / Neck Ultrasound, A1c -NV.   Molly Lunch, MD [facility] [address] [place], [place] [address] Phone: [phone]  Fax: [phone]     12/03/2022, 3:09 PM  This note was partially dictated with voice recognition software. Similar sounding words can be transcribed inadequately or may not  be corrected upon review.

## 2022-12-11 ENCOUNTER — Ambulatory Visit: Payer: Federal, State, Local not specified - PPO | Attending: Internal Medicine

## 2022-12-11 DIAGNOSIS — R0789 Other chest pain: Secondary | ICD-10-CM | POA: Diagnosis not present

## 2022-12-12 LAB — ECHOCARDIOGRAM COMPLETE
AR max vel: 2.81 cm2
AV Area VTI: 2.66 cm2
AV Area mean vel: 2.55 cm2
AV Mean grad: 5 mmHg
AV Peak grad: 8.8 mmHg
Ao pk vel: 1.48 m/s
Area-P 1/2: 3.79 cm2
Calc EF: 64.7 %
MV VTI: 2.96 cm2
S' Lateral: 3.6 cm
Single Plane A2C EF: 58.9 %
Single Plane A4C EF: 66.7 %

## 2022-12-15 ENCOUNTER — Telehealth: Payer: Self-pay

## 2022-12-15 ENCOUNTER — Ambulatory Visit (HOSPITAL_COMMUNITY)
Admission: RE | Admit: 2022-12-15 | Discharge: 2022-12-15 | Disposition: A | Discharge status: Home / Self Care | Payer: Medicare Other | Source: Ambulatory Visit | Attending: "Endocrinology | Admitting: "Endocrinology

## 2022-12-15 DIAGNOSIS — E890 Postprocedural hypothyroidism: Secondary | ICD-10-CM | POA: Insufficient documentation

## 2022-12-15 NOTE — Telephone Encounter (Signed)
Patient verbalized understanding and informed. Copy sent to PCP

## 2022-12-15 NOTE — Telephone Encounter (Signed)
-----   Message from Vishnu P Mallipeddi sent at 12/15/2022  8:47 AM EDT ----- Normal pumping function of the heart and no valvular heart disease.  Aorta and ascending aorta are structurally normal, no evidence of aneurysm.

## 2022-12-17 ENCOUNTER — Encounter (INDEPENDENT_AMBULATORY_CARE_PROVIDER_SITE_OTHER): Payer: Self-pay

## 2022-12-17 NOTE — Telephone Encounter (Signed)
At the last office visit the patient was supposed to a 3 month follow up. Please contact her with an appointment. Thanks

## 2022-12-25 ENCOUNTER — Encounter: Payer: Self-pay | Admitting: Internal Medicine

## 2022-12-26 ENCOUNTER — Ambulatory Visit
Admission: RE | Admit: 2022-12-26 | Discharge: 2022-12-26 | Disposition: A | Discharge status: Home / Self Care | Payer: Federal, State, Local not specified - PPO | Source: Ambulatory Visit | Attending: Physician Assistant | Admitting: Physician Assistant

## 2022-12-26 ENCOUNTER — Ambulatory Visit: Payer: Medicare Other

## 2022-12-26 ENCOUNTER — Other Ambulatory Visit: Payer: Self-pay | Admitting: Internal Medicine

## 2022-12-26 VITALS — BP 153/84 | HR 68 | Temp 98.1°F | Resp 20

## 2022-12-26 DIAGNOSIS — Z7722 Contact with and (suspected) exposure to environmental tobacco smoke (acute) (chronic): Secondary | ICD-10-CM | POA: Diagnosis not present

## 2022-12-26 DIAGNOSIS — R053 Chronic cough: Secondary | ICD-10-CM | POA: Diagnosis not present

## 2022-12-26 DIAGNOSIS — R059 Cough, unspecified: Secondary | ICD-10-CM | POA: Diagnosis not present

## 2022-12-26 DIAGNOSIS — J40 Bronchitis, not specified as acute or chronic: Secondary | ICD-10-CM | POA: Diagnosis not present

## 2022-12-26 DIAGNOSIS — K219 Gastro-esophageal reflux disease without esophagitis: Secondary | ICD-10-CM

## 2022-12-26 DIAGNOSIS — J4521 Mild intermittent asthma with (acute) exacerbation: Secondary | ICD-10-CM

## 2022-12-26 MED ORDER — PANTOPRAZOLE SODIUM 40 MG PO TBEC
40.0000 mg | DELAYED_RELEASE_TABLET | Freq: Two times a day (BID) | ORAL | 3 refills | Status: DC
Start: 2022-12-26 — End: 2022-12-29

## 2022-12-26 MED ORDER — BUDESONIDE-FORMOTEROL FUMARATE 80-4.5 MCG/ACT IN AERO
2.0000 | INHALATION_SPRAY | Freq: Two times a day (BID) | RESPIRATORY_TRACT | 1 refills | Status: DC
Start: 2022-12-26 — End: 2023-02-03

## 2022-12-26 MED ORDER — CETIRIZINE HCL 10 MG PO TABS: 10.0000 mg | ORAL_TABLET | Freq: Every day | ORAL | 1 refills | Status: DC

## 2022-12-26 MED ORDER — ALBUTEROL SULFATE HFA 108 (90 BASE) MCG/ACT IN AERS
2.0000 | INHALATION_SPRAY | Freq: Once | RESPIRATORY_TRACT | Status: AC
  Administered 2022-12-26: 2 via RESPIRATORY_TRACT

## 2022-12-26 NOTE — Discharge Instructions (Signed)
I will contact you if the radiologist sees something on your x-ray that changes our treatment plan.  As we discussed, I am concerned that you might have allergies leading to asthma symptoms.  Start cetirizine daily.  Use the albuterol inhaler every 4-6 hours as needed.  Start Symbicort twice daily.  Make sure to rinse your mouth following use of this medication to prevent thrush.  Continue your acid reflux medicine as previously prescribed.  If you have any worsening or changing symptoms including worsening cough, shortness of breath, fever you need to be seen immediately.  Follow-up with your primary care within a few weeks.

## 2022-12-26 NOTE — ED Triage Notes (Signed)
Pt reports she has a bad cough, sore throat , and fluid in ears x 2 months

## 2022-12-26 NOTE — ED Provider Notes (Signed)
[facility]    CSN: [id number] Arrival date & time: 12/26/22  1029      History   Chief Complaint Chief Complaint  Patient presents with   Cough    Coughing for two months; nothing seems to work. - Entered by patient    HPI Molly Knight is a 67 y.o. female.   Patient presents today with a several month history of persistent cough.  She reports that this is worse at night and will sometimes wake her up at night.  It is primarily dry and she will also develop some sore throat, fullness in her ears, congestion.  She denies any fever, chest pain, shortness of breath, nausea, vomiting.  She does have a history of seasonal allergies but is not taking any medication for this on a regular basis.  Denies history of chronic lung condition including asthma, COPD, smoking.  Denies any recent antibiotics or steroids.  She did have COVID back in July 2024 but does not believe that this triggered her symptoms as they have already been present.  She has had COVID-19 vaccines but has not had most recent booster.  She does have a history of GERD for which she is prescribed Pepcid and pantoprazole.  She reports compliance with this medication and does not feel her GERD has worsened recently.    Past Medical History:  Diagnosis Date   Depression    GERD (gastroesophageal reflux disease)    Hiatal hernia    Thyroid disease    Thyroid nodule 09/27/2015   Last Assessment & Plan:  Formatting of this note might be different from the original. Stable.  Her left lobe is normal in size on physical exam with no palpable nodule.    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   H/O tooth extraction 01/01/2022   Essential hypertension 12/09/2021   Sciatica of left side 11/06/2021   Encounter for general adult medical examination with abnormal findings 06/14/2021   Benign paroxysmal positional vertigo 12/18/2020   Snoring 06/25/2020   MDD (major depressive disorder), recurrent, in partial remission (HCC) 01/10/2020   Hot flashes 01/10/2020   Obesity (BMI 30.0-34.9) 01/10/2020   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 Roseanne Reno - repeat Jan 2025 per patient.   ESOPHAGOGASTRODUODENOSCOPY (EGD) WITH PROPOFOL N/A 10/09/2022   Procedure: ESOPHAGOGASTRODUODENOSCOPY (EGD) WITH PROPOFOL;  Surgeon: Dolores Frame, MD;  [facility];  Service: Gastroenterology;  Laterality: N/A;  12:30pm;asa 2   SAVORY DILATION  10/09/2022   Procedure: SAVORY DILATION;  Surgeon: Dolores Frame, MD;  Location: [facility];  Service: Gastroenterology;;   SHOULDER SURGERY Bilateral 02/2019   THYROIDECTOMY, PARTIAL  10/2016    OB History  Gravida  6   Para  3   Term  3   Preterm      AB  3   Living  3      SAB  3   IAB      Ectopic      Multiple      Live Births  3            Home Medications    Prior to Admission medications   Medication Sig Start Date End Date Taking? Authorizing Provider  budesonide-formoterol (SYMBICORT) 80-4.5 MCG/ACT inhaler Inhale 2 puffs into the lungs 2 (two) times daily. 12/26/22  Yes Chealsey Miyamoto,  Noberto Retort, PA-C  cetirizine (ZYRTEC ALLERGY) 10 MG tablet Take 1 tablet (10 mg total) by mouth at bedtime. 12/26/22  Yes Jhalen Eley K, PA-C  Calcium Citrate-Vitamin D (CALCIUM + D PO) Take 1 tablet by mouth 2 (two) times daily.    [provider]  estradiol (VIVELLE-DOT) 0.05 MG/24HR patch Place 1 patch (0.05 mg total) onto the skin 2 (two) times a week. 10/09/22   Lazaro Arms, MD  famotidine (PEPCID) 20 MG tablet TAKE 1 TABLET(20 MG) BY MOUTH AT BEDTIME 11/06/22   Carlan, Chelsea L, NP  FLUoxetine (PROZAC) 20 MG capsule TAKE 1 CAPSULE(20 MG) BY MOUTH DAILY 10/02/22   Anabel Halon, MD  Lactobacillus (PROBIOTIC ACIDOPHILUS PO) Take 1 tablet by mouth daily.    [provider]  levothyroxine (SYNTHROID) 75 MCG tablet TAKE 1 TABLET(75 MCG) BY MOUTH DAILY BEFORE BREAKFAST 12/03/22   Anabel Halon, MD  losartan (COZAAR) 50 MG tablet Take 1 tablet (50 mg total) by mouth daily. 11/19/22   Anabel Halon, MD  meclizine (ANTIVERT) 25 MG tablet Take 25 mg by mouth 3 (three) times daily as needed for dizziness.    [provider]  Multiple Vitamins-Minerals (MULTI VITAMIN/MINERALS) TABS Take 1 tablet by mouth daily.    [provider]  nitroGLYCERIN (NITROSTAT) 0.4 MG SL tablet Place 1 tablet (0.4 mg total) under the tongue every 5 (five) minutes as needed for chest pain. 11/09/22   Peter Garter, PA  pantoprazole (PROTONIX) 40 MG tablet Take 1 tablet (40 mg total) by mouth 2 (two) times daily. 12/26/22   Anabel Halon, MD  rosuvastatin (CRESTOR) 10 MG tablet Take 1 tablet (10 mg total) by mouth at bedtime. 11/20/22   Mallipeddi, Orion Modest, MD    Family History Family History  Problem Relation Age of Onset   Hypertension Mother    Aortic aneurysm Mother    Renal Disease Father    Colon cancer Sister    Heart attack Sister    Other Sister        tahycardia   Throat cancer Brother    Other Brother        heart valve surgery    Social History Social History    Tobacco Use   Smoking status: Never    Passive exposure: Never   Smokeless tobacco: Never  Vaping Use   Vaping status: Never Used  Substance Use Topics   Alcohol use: Not Currently   Drug use: Never     Allergies   Patient has no known allergies.   Review of Systems Review of Systems  Constitutional:  Negative for activity change, appetite change, fatigue and fever.  HENT:  Positive for postnasal drip and sore throat. Negative for congestion, ear pain, sinus pressure and sneezing.   Respiratory:  Positive for cough. Negative  for shortness of breath.   Cardiovascular:  Negative for chest pain.  Gastrointestinal:  Negative for abdominal pain, diarrhea, nausea and vomiting.     Physical Exam Triage Vital Signs ED Triage Vitals  Encounter Vitals Group     BP 12/26/22 1057 (!) 153/84     Systolic BP Percentile --      Diastolic BP Percentile --      Pulse Rate 12/26/22 1057 68     Resp 12/26/22 1057 20     Temp 12/26/22 1057 98.1 F (36.7 C)     Temp Source 12/26/22 1057 Oral     SpO2 12/26/22 1057 97 %     Weight --      Height --      Head Circumference --      Peak Flow --      Pain Score 12/26/22 1058 0     Pain Loc --      Pain Education --      Exclude from Growth Chart --    No data found.  Updated Vital Signs BP (!) 153/84 (BP Location: Right Arm)   Pulse 68   Temp 98.1 F (36.7 C) (Oral)   Resp 20   LMP  (LMP Unknown)   SpO2 97%   Visual Acuity Right Eye Distance:   Left Eye Distance:   Bilateral Distance:    Right Eye Near:   Left Eye Near:    Bilateral Near:     Physical Exam Vitals reviewed.  Constitutional:      General: She is awake. She is not in acute distress.    Appearance: Normal appearance. She is well-developed. She is not ill-appearing.     Comments: Very pleasant female appears stated age in no acute distress sitting comfortably in exam room  HENT:     Head: Normocephalic and atraumatic.     Right Ear: Ear canal and  external ear normal. There is impacted cerumen. Tympanic membrane is not erythematous or bulging.     Left Ear: Ear canal and external ear normal. There is impacted cerumen. Tympanic membrane is not erythematous or bulging.     Ears:     Comments: Cerumen impaction noted bilaterally; able to visualize approximately 60% of TM that appears normal.    Nose:     Right Sinus: Maxillary sinus tenderness present. No frontal sinus tenderness.     Left Sinus: Maxillary sinus tenderness and frontal sinus tenderness present.     Mouth/Throat:     Pharynx: Uvula midline. No oropharyngeal exudate or posterior oropharyngeal erythema.  Cardiovascular:     Rate and Rhythm: Normal rate and regular rhythm.     Heart sounds: Normal heart sounds, S1 normal and S2 normal. No murmur heard. Pulmonary:     Effort: Pulmonary effort is normal.     Breath sounds: Normal breath sounds. No wheezing, rhonchi or rales.     Comments: Reactive cough with deep breathing.  Decreased aeration bilateral bases. Psychiatric:        Behavior: Behavior is cooperative.      UC Treatments / Results  Labs (all labs ordered are listed, but only abnormal results are displayed) Labs Reviewed - No data to display  EKG   Radiology No results found.  Procedures Procedures (including critical care time)  Medications Ordered in UC Medications  albuterol (VENTOLIN HFA) 108 (90 Base) MCG/ACT inhaler 2 puff (2 puffs Inhalation Given 12/26/22 1125)    Initial Impression / Assessment and Plan /  UC Course  I have reviewed the triage vital signs and the nursing notes.  Pertinent labs & imaging results that were available during my care of the patient were reviewed by me and considered in my medical decision making (see chart for details).     Patient is well-appearing, afebrile, nontoxic, nontachycardic.  No indication for viral testing given she has been symptomatic for several months and this would not change management.  No  evidence of acute infection on physical exam though would warrant initiation of antibiotics.  Chest x-ray was obtained and based on my primary read there is no obvious cardiopulmonary disease.  At the time of discharge we were waiting for radiologist over read and we will contact patient if there is something abnormal and we need to change our treatment plan.  She was given albuterol in clinic with some improvement of symptoms.  Discussed symptoms are likely related to allergy related asthma given her clinical presentation.  She was started on cetirizine daily and sent home with albuterol to use every 4-6 hours as needed.  Will also start Symbicort we discussed that she should rinse her mouth following use of this medication to prevent thrush.  We did discuss that GERD could be contributing to chronic cough and so she should continue the previously prescribed medications to manage this condition.  Recommended close follow-up with her primary care.  Discussed that if she has any worsening or changing symptoms including fever, worsening cough, shortness of breath she needs to be seen immediately.  Strict return precautions given.  Final Clinical Impressions(s) / UC Diagnoses   Final diagnoses:  Chronic cough  Mild intermittent extrinsic asthma with acute exacerbation     Discharge Instructions      I will contact you if the radiologist sees something on your x-ray that changes our treatment plan.  As we discussed, I am concerned that you might have allergies leading to asthma symptoms.  Start cetirizine daily.  Use the albuterol inhaler every 4-6 hours as needed.  Start Symbicort twice daily.  Make sure to rinse your mouth following use of this medication to prevent thrush.  Continue your acid reflux medicine as previously prescribed.  If you have any worsening or changing symptoms including worsening cough, shortness of breath, fever you need to be seen immediately.  Follow-up with your primary care  within a few weeks.     ED Prescriptions     Medication Sig Dispense Auth. Provider   budesonide-formoterol (SYMBICORT) 80-4.5 MCG/ACT inhaler Inhale 2 puffs into the lungs 2 (two) times daily. 1 each Odean Fester K, PA-C   cetirizine (ZYRTEC ALLERGY) 10 MG tablet Take 1 tablet (10 mg total) by mouth at bedtime. 30 tablet Eann Cleland, Noberto Retort, PA-C      PDMP not reviewed this encounter.   Jeani Hawking, PA-C 12/26/22 1156

## 2022-12-27 ENCOUNTER — Other Ambulatory Visit: Payer: Self-pay | Admitting: Internal Medicine

## 2022-12-27 DIAGNOSIS — K219 Gastro-esophageal reflux disease without esophagitis: Secondary | ICD-10-CM

## 2022-12-31 ENCOUNTER — Ambulatory Visit: Payer: Medicare Other

## 2022-12-31 VITALS — Ht 67.0 in | Wt 197.0 lb

## 2022-12-31 DIAGNOSIS — Z0000 Encounter for general adult medical examination without abnormal findings: Secondary | ICD-10-CM | POA: Diagnosis not present

## 2022-12-31 NOTE — Patient Instructions (Signed)
Ms. Molly Knight , Thank you for taking time to come for your Medicare Wellness Visit. I appreciate your ongoing commitment to your health goals. Please review the following plan we discussed and let me know if I can assist you in the future.   Referrals/Orders/Follow-Ups/Clinician Recommendations:  You are due for the vaccines checked below. You may have these done at your preferred pharmacy. Please have them fax the office proof of the vaccines so that we can update your chart.   [x]  Flu (due annually)  Recommended this fall either at PCP office or through your local pharmacy. The flu season starts August 1 of each year.   []  Shingrix (Shingles vaccine): CDC recommends 2 doses of Shingrix separated by 2-6 months for aged 25 years and older:  []  Pneumonia Vaccines: Recommended for adults 65 years or older  []  TDAP (Tetanus) Vaccine every 10 years:Recommended every 10 years; Please call your insurance company to determine your out of pocket expense. You also receive this vaccine at your local pharmacy or Health Dept.  [x]  Covid-19: Available now at any [facility] pharmacy (see info below)  You may also get your vaccines at any [facility] (locations listed below.) Vaccine hours are Monday - Friday 9:00 - 4:00. No appointments are required. Most insurances are accepted including Medicaid. Anyone can use the community pharmacies, and people are not required to have a [facility] provider.  Community Pharmacy Locations offering vaccines:   [facility] exercise [facility]   [facility] [place] [facility]  10 vaccines are offered at the [employer]: Covid, flu, Tdap, shingles, RSV, pneumonia, meningococcal, hepatitis A, hepatitis B, and HPV.    This is a list of the screening recommended for you and due dates:  Health Maintenance  Topic Date Due   COVID-19 Vaccine (6 - 2023-24 season) 12/21/2022   Flu Shot   11/20/2023*   Mammogram  09/19/2023   Medicare Annual Wellness Visit  12/31/2023   DTaP/Tdap/Td vaccine (2 - Td or Tdap) 03/24/2024   Colon Cancer Screening  05/15/2024   DEXA scan (bone density measurement)  08/03/2024   Pneumonia Vaccine  Completed   Hepatitis C Screening  Completed   HIV Screening  Completed   Zoster (Shingles) Vaccine  Completed   HPV Vaccine  Aged Out  *Topic was postponed. The date shown is not the original due date.    Advanced directives: (ACP Link)Information on Advanced Care Planning can be found at [facility] Directives Advance Health Care Directives ([email or web address]/)   Next Medicare Annual Wellness Visit scheduled for next year: Yes March 09, 2024 at 2:20pm virtual visit  Understanding Your Risk for Falls Millions of people have serious injuries from falls each year. It is important to understand your risk of falling. Talk with your health care provider about your risk and what you can do to lower it. If you do have a serious fall, make sure to tell your provider. Falling once raises your risk of falling again. How can falls affect me? Serious injuries from falls are common. These include: Broken bones, such as hip fractures. Head injuries, such as traumatic brain injuries (TBI) or concussions. A fear of falling can cause you to avoid activities and stay at home. This can make your muscles weaker and raise your risk for a fall. What can increase my risk? There are a number of risk factors that  increase your risk for falling. The more risk factors you have, the higher your risk of falling. Serious injuries from a fall happen most often to people who are older than 66 years old. Teenagers and young adults ages 59-29 are also at higher risk. Common risk factors include: Weakness in the lower body. Being generally weak or confused due to long-term (chronic) illness. Dizziness or balance problems. Poor vision. Medicines  that cause dizziness or drowsiness. These may include: Medicines for your blood pressure, heart, anxiety, insomnia, or swelling (edema). Pain medicines. Muscle relaxants. Other risk factors include: Drinking alcohol. Having had a fall in the past. Having foot pain or wearing improper footwear. Working at a dangerous job. Having any of the following in your home: Tripping hazards, such as floor clutter or loose rugs. Poor lighting. Pets. Having dementia or memory loss. What actions can I take to lower my risk of falling?     Physical activity Stay physically fit. Do strength and balance exercises. Consider taking a regular class to build strength and balance. Yoga and tai chi are good options. Vision Have your eyes checked every year and your prescription for glasses or contacts updated as needed. Shoes and walking aids Wear non-skid shoes. Wear shoes that have rubber soles and low heels. Do not wear high heels. Do not walk around the house in socks or slippers. Use a cane or walker as told by your provider. Home safety Attach secure railings on both sides of your stairs. Install grab bars for your bathtub, shower, and toilet. Use a non-skid mat in your bathtub or shower. Attach bath mats securely with double-sided, non-slip rug tape. Use good lighting in all rooms. Keep a flashlight near your bed. Make sure there is a clear path from your bed to the bathroom. Use night-lights. Do not use throw rugs. Make sure all carpeting is taped or tacked down securely. Remove all clutter from walkways and stairways, including extension cords. Repair uneven or broken steps and floors. Avoid walking on icy or slippery surfaces. Walk on the grass instead of on icy or slick sidewalks. Use ice melter to get rid of ice on walkways in the winter. Use a cordless phone. Questions to ask your health care provider Can you help me check my risk for a fall? Do any of my medicines make me more likely to  fall? Should I take a vitamin D supplement? What exercises can I do to improve my strength and balance? Should I make an appointment to have my vision checked? Do I need a bone density test to check for weak bones (osteoporosis)? Would it help to use a cane or a walker? Where to find more information Centers for Disease Control and Prevention, [employer]: TonerPromos.no Community-Based Fall Prevention Programs: TonerPromos.no [employer] on Aging: BaseRingTones.pl Contact a health care provider if: You fall at home. You are afraid of falling at home. You feel weak, drowsy, or dizzy. This information is not intended to replace advice given to you by your health care provider. Make sure you discuss any questions you have with your health care provider. Document Revised: 12/09/2021 Document Reviewed: 12/09/2021 Elsevier Patient Education  2024 [employer].

## 2022-12-31 NOTE — Progress Notes (Signed)
 Because this visit was a virtual/telehealth visit,  certain criteria was not obtained, such a blood pressure, CBG if applicable, and timed get up and go. Any medications not marked as "taking" were not mentioned during the medication reconciliation part of the visit. Any vitals not documented were not able to be obtained due to this being a telehealth visit or patient was unable to self-report a recent blood pressure reading due to a lack of equipment at home via telehealth. Vitals that have been documented are verbally provided by the patient.   Subjective:   Molly Knight is a 67 y.o. female who presents for Medicare Annual (Subsequent) preventive examination.  Visit Complete: Virtual  I connected with  Molly Knight on 12/31/22 by a audio enabled telemedicine application and verified that I am speaking with the correct person using two identifiers.  Patient Location: Home  Provider Location: Home Office  I discussed the limitations of evaluation and management by telemedicine. The patient expressed understanding and agreed to proceed.  Patient Medicare AWV questionnaire was completed by the patient on 12/24/2022; I have confirmed that all information answered by patient is correct and no changes since this date.  Review of Systems     Cardiac Risk Factors include: advanced age (>55men, >89 women);obesity (BMI >30kg/m2);dyslipidemia;hypertension     Objective:    Today's Vitals   12/24/22 1203 12/31/22 0912  Weight:  197 lb (89.4 kg)  Height:  5\' 7"  (1.702 m)  PainSc: 0-No pain    Body mass index is 30.85 kg/m.     12/31/2022    9:11 AM 11/09/2022   11:00 AM 10/09/2022   11:08 AM  Advanced Directives  Does Patient Have a Medical Advance Directive? No No No  Would patient like information on creating a medical advance directive? Yes (MAU/Ambulatory/Procedural Areas - Information given)  No - Patient declined    Current Medications (verified) Outpatient Encounter  Medications as of 12/31/2022  Medication Sig   Calcium Citrate-Vitamin D (CALCIUM + D PO) Take 1 tablet by mouth 2 (two) times daily.   cetirizine (ZYRTEC ALLERGY) 10 MG tablet Take 1 tablet (10 mg total) by mouth at bedtime.   estradiol (VIVELLE-DOT) 0.05 MG/24HR patch Place 1 patch (0.05 mg total) onto the skin 2 (two) times a week.   famotidine (PEPCID) 20 MG tablet TAKE 1 TABLET(20 MG) BY MOUTH AT BEDTIME   FLUoxetine (PROZAC) 20 MG capsule TAKE 1 CAPSULE(20 MG) BY MOUTH DAILY   Lactobacillus (PROBIOTIC ACIDOPHILUS PO) Take 1 tablet by mouth daily.   levothyroxine (SYNTHROID) 75 MCG tablet TAKE 1 TABLET(75 MCG) BY MOUTH DAILY BEFORE BREAKFAST   losartan (COZAAR) 50 MG tablet Take 1 tablet (50 mg total) by mouth daily.   meclizine (ANTIVERT) 25 MG tablet Take 25 mg by mouth 3 (three) times daily as needed for dizziness.   Multiple Vitamins-Minerals (MULTI VITAMIN/MINERALS) TABS Take 1 tablet by mouth daily.   pantoprazole (PROTONIX) 40 MG tablet TAKE 1 TABLET(40 MG) BY MOUTH DAILY   rosuvastatin (CRESTOR) 10 MG tablet Take 1 tablet (10 mg total) by mouth at bedtime.   budesonide-formoterol (SYMBICORT) 80-4.5 MCG/ACT inhaler Inhale 2 puffs into the lungs 2 (two) times daily.   nitroGLYCERIN (NITROSTAT) 0.4 MG SL tablet Place 1 tablet (0.4 mg total) under the tongue every 5 (five) minutes as needed for chest pain.   No facility-administered encounter medications on file as of 12/31/2022.    Allergies (verified) Patient has no known allergies.   History: Past Medical History:  Diagnosis Date   Allergy    Seasonal   Depression    GERD (gastroesophageal reflux disease)    Heart murmur 1965   Hiatal hernia    Hypertension 2023   Thyroid disease    Thyroid nodule 09/27/2015   Last Assessment & Plan:  Formatting of this note might be different from the original. Stable.  Her left lobe is normal in size on physical exam with no palpable nodule.   Past Surgical History:  Procedure  Laterality Date   ABDOMINAL HYSTERECTOMY  2003   bladder tach     CESAREAN SECTION     COLONOSCOPY  04/2019   [place] - Dr. Roseanne Reno - repeat Jan 2025 per patient.   ESOPHAGOGASTRODUODENOSCOPY (EGD) WITH PROPOFOL N/A 10/09/2022   Procedure: ESOPHAGOGASTRODUODENOSCOPY (EGD) WITH PROPOFOL;  Surgeon: Dolores Frame, MD;  Location: [facility];  Service: Gastroenterology;  Laterality: N/A;  12:30pm;asa 2   SAVORY DILATION  10/09/2022   Procedure: SAVORY DILATION;  Surgeon: Marguerita Merles, Reuel Boom, MD;  Location: [facility];  Service: Gastroenterology;;   SHOULDER SURGERY Bilateral 02/2019   THYROIDECTOMY, PARTIAL  10/2016   TUBAL LIGATION  1991   Family History  Problem Relation Age of Onset   Hypertension Mother    Aortic aneurysm Mother    Heart disease Mother    Renal Disease Father    Alcohol abuse Father    Asthma Father    Heart disease Father    Colon cancer Sister    Cancer Sister    Early death Sister    Heart attack Sister    Heart disease Sister    Other Sister        tahycardia   Throat cancer Brother    Cancer Brother    Other Brother        heart valve surgery   Heart disease Sister    Social History   Socioeconomic History   Marital status: Married    Spouse name: Not on file   Number of children: Not on file   Years of education: Not on file   Highest education level: Associate degree: academic program  Occupational History   Not on file  Tobacco Use   Smoking status: Never    Passive exposure: Never   Smokeless tobacco: Never  Vaping Use   Vaping status: Never Used  Substance and Sexual Activity   Alcohol use: Not Currently   Drug use: Never   Sexual activity: Yes    Birth control/protection: Surgical    Comment: hyst  Other Topics Concern   Not on file  Social History Narrative   Not on file   Social Determinants of Health   Financial Resource Strain: Low Risk  (12/24/2022)   Overall Financial Resource Strain  (CARDIA)    Difficulty of Paying Living Expenses: Not hard at all  Food Insecurity: No Food Insecurity (12/24/2022)   Hunger Vital Sign    Worried About Running Out of Food in the Last Year: Never true    Ran Out of Food in the Last Year: Never true  Transportation Needs: No Transportation Needs (12/24/2022)   PRAPARE - Administrator, Civil Service (Medical): No    Lack of Transportation (Non-Medical): No  Physical Activity: Insufficiently Active (12/24/2022)   Exercise Vital Sign    Days of Exercise per Week: 3 days    Minutes of Exercise per Session: 20 min  Stress: No Stress Concern Present (12/24/2022)   [employer]  of Occupational Health - Occupational Stress Questionnaire    Feeling of Stress : Not at all  Social Connections: Unknown (12/24/2022)   Social Connection and Isolation Panel [NHANES]    Frequency of Communication with Friends and Family: Once a week    Frequency of Social Gatherings with Friends and Family: Once a week    Attends Religious Services: Patient declined    Database administrator or Organizations: No    Attends Engineer, structural: Never    Marital Status: Married  Recent Concern: Social Connections - Socially Isolated (12/24/2022)   Social Connection and Isolation Panel [NHANES]    Frequency of Communication with Friends and Family: Once a week    Frequency of Social Gatherings with Friends and Family: Once a week    Attends Religious Services: Never    Database administrator or Organizations: No    Attends Engineer, structural: Not on file    Marital Status: Married    Tobacco Counseling Counseling given: Yes   Clinical Intake:  Pre-visit preparation completed: Yes  Pain : No/denies pain Pain Score: 0-No pain     BMI - recorded: 30.85 Nutritional Status: BMI > 30  Obese Nutritional Risks: None Diabetes: No  How often do you need to have someone help you when you read instructions, pamphlets, or other written  materials from your doctor or pharmacy?: 1 - Never  Interpreter Needed?: No  Information entered by ::  Anjelika Ausburn, CMA   Activities of Daily Living    12/24/2022   12:03 PM  In your present state of health, do you have any difficulty performing the following activities:  Hearing? 0  Vision? 0  Difficulty concentrating or making decisions? 0  Walking or climbing stairs? 0  Dressing or bathing? 0  Doing errands, shopping? 0  Preparing Food and eating ? N  Using the Toilet? N  In the past six months, have you accidently leaked urine? N  Do you have problems with loss of bowel control? N  Managing your Medications? N  Managing your Finances? N  Housekeeping or managing your Housekeeping? N    Patient Care Team: Anabel Halon, MD as PCP - General (Internal Medicine) Mallipeddi, Orion Modest, MD as PCP - Cardiology (Cardiology)  Indicate any recent Medical Services you may have received from other than [facility] providers in the past year (date may be approximate).     Assessment:   This is a routine wellness examination for Molly Knight.  Hearing/Vision screen Hearing Screening - Comments:: Patient denies any hearing difficulties.   Vision Screening - Comments:: Wears rx glasses - up to date with routine eye exams with Dr. Daisy Lazar at [facility]   Goals Addressed             This Visit's Progress    patient stated       Lose weight, move more, and get off some of these medications       Depression Screen    12/31/2022    9:18 AM 11/19/2022    3:26 PM 07/14/2022   10:56 AM 05/13/2022   10:33 AM 04/04/2022    2:45 PM 01/09/2022    9:35 AM 12/09/2021    8:15 AM  PHQ 2/9 Scores  PHQ - 2 Score 0 0 3 0 0 0 1  PHQ- 9 Score   11  2      Fall Risk    12/24/2022   12:03 PM 11/19/2022  3:26 PM 07/14/2022   10:56 AM 05/13/2022   10:33 AM 04/04/2022    2:45 PM  Fall Risk   Falls in the past year? 0 0 0 0 0  Number falls in past yr: 0 0 0 0 0  Injury with  Fall? 0 0 0 0 0  Risk for fall due to : No Fall Risks    No Fall Risks  Follow up Falls prevention discussed    Falls evaluation completed    MEDICARE RISK AT HOME: Medicare Risk at Home Any stairs in or around the home?: Yes If so, are there any without handrails?: Yes Home free of loose throw rugs in walkways, pet beds, electrical cords, etc?: No Adequate lighting in your home to reduce risk of falls?: Yes Life alert?: No Use of a cane, walker or w/c?: No Grab bars in the bathroom?: No Shower chair or bench in shower?: No Elevated toilet seat or a handicapped toilet?: No  TIMED UP AND GO:  Was the test performed?  No    Cognitive Function:        12/31/2022    9:16 AM  6CIT Screen  What Year? 0 points  What month? 0 points  What time? 0 points  Count back from 20 0 points  Months in reverse 0 points  Repeat phrase 0 points  Total Score 0 points    Immunizations Immunization History  Administered Date(s) Administered   Influenza,inj,Quad PF,6+ Mos 01/23/2019, 01/10/2020, 12/18/2020, 01/09/2022   Moderna Covid-19 Vaccine Bivalent Booster 90yrs & up 03/01/2021   Moderna Sars-Covid-2 Vaccination 05/17/2019, 05/20/2019, 06/17/2019, 04/04/2020   PNEUMOCOCCAL CONJUGATE-20 05/13/2022   Tdap 03/24/2014   Zoster Recombinant(Shingrix) 08/07/2017, 10/16/2017    TDAP status: Up to date  Flu Vaccine status: Due, Education has been provided regarding the importance of this vaccine. Advised may receive this vaccine at local pharmacy or Health Dept. Aware to provide a copy of the vaccination record if obtained from local pharmacy or Health Dept. Verbalized acceptance and understanding.  Pneumococcal vaccine status: Up to date  Covid-19 vaccine status: Information provided on how to obtain vaccines.   Qualifies for Shingles Vaccine? No   Zostavax completed Yes   Shingrix Completed?: Yes  Screening Tests Health Maintenance  Topic Date Due   Medicare Annual Wellness (AWV)   Never done   INFLUENZA VACCINE  11/20/2022   COVID-19 Vaccine (6 - 2023-24 season) 12/21/2022   DTaP/Tdap/Td (2 - Td or Tdap) 03/24/2024   MAMMOGRAM  09/18/2024   Colonoscopy  05/15/2029   Pneumonia Vaccine 28+ Years old  Completed   DEXA SCAN  Completed   Hepatitis C Screening  Completed   HIV Screening  Completed   Zoster Vaccines- Shingrix  Completed   HPV VACCINES  Aged Out    Health Maintenance  Health Maintenance Due  Topic Date Due   Medicare Annual Wellness (AWV)  Never done   INFLUENZA VACCINE  11/20/2022   COVID-19 Vaccine (6 - 2023-24 season) 12/21/2022    Colorectal cancer screening: Type of screening: Colonoscopy. Completed 05/16/2019. Repeat every 5 years  Mammogram status: Completed 09/19/2022. Repeat every year  Bone Density status: Completed 09/19/2023. Results reflect: Bone density results: OSTEOPENIA. Repeat every 2 years.  Lung Cancer Screening: (Low Dose CT Chest recommended if Age 72-80 years, 20 pack-year currently smoking OR have quit w/in 15years.) does not qualify.   Lung Cancer Screening Referral: na  Additional Screening:  Hepatitis C Screening: does not qualify; Completed 06/10/2021  Vision Screening:  Recommended annual ophthalmology exams for early detection of glaucoma and other disorders of the eye. Is the patient up to date with their annual eye exam?  Yes  Who is the provider or what is the name of the office in which the patient attends annual eye exams? Dr. Daisy Lazar [facility]  Dental Screening: Recommended annual dental exams for proper oral hygiene  Diabetic Foot Exam: na  Community Resource Referral / Chronic Care Management: CRR required this visit?  No   CCM required this visit?  No     Plan:     I have personally reviewed and noted the following in the patient's chart:   Medical and social history Use of alcohol, tobacco or illicit drugs  Current medications and supplements including opioid  prescriptions. Patient is not currently taking opioid prescriptions. Functional ability and status Nutritional status Physical activity Advanced directives List of other physicians Hospitalizations, surgeries, and ER visits in previous 12 months Vitals Screenings to include cognitive, depression, and falls Referrals and appointments  In addition, I have reviewed and discussed with patient certain preventive protocols, quality metrics, and best practice recommendations. A written personalized care plan for preventive services as well as general preventive health recommendations were provided to patient.     Jordan Hawks Raeanne Deschler, CMA   12/31/2022   After Visit Summary: (MyChart) Due to this being a telephonic visit, the after visit summary with patients personalized plan was offered to patient via MyChart   Nurse Notes:

## 2023-01-22 ENCOUNTER — Other Ambulatory Visit: Payer: Self-pay | Admitting: Internal Medicine

## 2023-01-22 ENCOUNTER — Other Ambulatory Visit: Payer: Self-pay

## 2023-01-22 DIAGNOSIS — K219 Gastro-esophageal reflux disease without esophagitis: Secondary | ICD-10-CM

## 2023-01-22 MED ORDER — PANTOPRAZOLE SODIUM 40 MG PO TBEC: 40.0000 mg | DELAYED_RELEASE_TABLET | Freq: Every day | ORAL | 1 refills | Status: DC

## 2023-01-30 ENCOUNTER — Other Ambulatory Visit: Payer: Self-pay | Admitting: Internal Medicine

## 2023-01-30 DIAGNOSIS — F320 Major depressive disorder, single episode, mild: Secondary | ICD-10-CM

## 2023-02-03 ENCOUNTER — Encounter (INDEPENDENT_AMBULATORY_CARE_PROVIDER_SITE_OTHER): Payer: Self-pay | Admitting: Gastroenterology

## 2023-02-03 ENCOUNTER — Ambulatory Visit (INDEPENDENT_AMBULATORY_CARE_PROVIDER_SITE_OTHER): Payer: Medicare Other | Admitting: Adult Health

## 2023-02-03 ENCOUNTER — Encounter: Payer: Self-pay | Admitting: Adult Health

## 2023-02-03 ENCOUNTER — Ambulatory Visit (INDEPENDENT_AMBULATORY_CARE_PROVIDER_SITE_OTHER): Payer: Medicare Other | Admitting: Gastroenterology

## 2023-02-03 VITALS — BP 150/81 | HR 58 | Temp 98.1°F | Ht 67.0 in | Wt 202.5 lb

## 2023-02-03 VITALS — BP 136/77 | HR 62 | Ht 67.0 in | Wt 203.5 lb

## 2023-02-03 DIAGNOSIS — Z90710 Acquired absence of both cervix and uterus: Secondary | ICD-10-CM | POA: Diagnosis not present

## 2023-02-03 DIAGNOSIS — R1310 Dysphagia, unspecified: Secondary | ICD-10-CM | POA: Diagnosis not present

## 2023-02-03 DIAGNOSIS — K219 Gastro-esophageal reflux disease without esophagitis: Secondary | ICD-10-CM

## 2023-02-03 DIAGNOSIS — Z860101 Personal history of adenomatous and serrated colon polyps: Secondary | ICD-10-CM

## 2023-02-03 DIAGNOSIS — N898 Other specified noninflammatory disorders of vagina: Secondary | ICD-10-CM | POA: Diagnosis not present

## 2023-02-03 DIAGNOSIS — Z800 Family history of malignant neoplasm of digestive organs: Secondary | ICD-10-CM | POA: Diagnosis not present

## 2023-02-03 DIAGNOSIS — B379 Candidiasis, unspecified: Secondary | ICD-10-CM | POA: Diagnosis not present

## 2023-02-03 DIAGNOSIS — N941 Unspecified dyspareunia: Secondary | ICD-10-CM | POA: Diagnosis not present

## 2023-02-03 LAB — POCT WET PREP (WET MOUNT)

## 2023-02-03 MED ORDER — FLUCONAZOLE 150 MG PO TABS
150.0000 mg | ORAL_TABLET | Freq: Every day | ORAL | 1 refills | Status: DC
Start: 2023-02-03 — End: 2023-02-16

## 2023-02-03 MED ORDER — ESTRADIOL 0.1 MG/GM VA CREA
TOPICAL_CREAM | VAGINAL | 1 refills | Status: DC
Start: 2023-02-03 — End: 2023-07-21

## 2023-02-03 NOTE — Progress Notes (Signed)
Referring Provider: Anabel Halon, MD Primary Care Physician:  Anabel Halon, MD Primary GI Physician: Dr. Levon Hedger   Chief Complaint  Patient presents with   Gastroesophageal Reflux    Follow up on GERD and dysphagia. Has been careful about what she is eating. Taking protonix as needed and pepcid as needed.  now instead of daily. Takes about once a week.    HPI:   Molly Knight is a 67 y.o. female with past medical history of depression, GERD, hiatal hernia, thyroid disease   Patient presenting today for follow up of GERD and dysphagia   Last seen may 2024, at that time reported long history of GERD.  Symptoms worse around the end of 2023, early 2024.  PCP had started Protonix 40 mg daily which had helped some but still having coughing with swallowing.  Having to clear her throat a lot and having occasional heartburn at times.  Some dysphagia mostly with pills occasionally with food.  Recommended to continue with Protonix 40 mg daily, start Pepcid 20 mg nightly, reflux precautions, schedule EGD, plan for repeat colonoscopy in January 2026  Present:  GERD is doing better with changing her diet and trying to avoid eating late. Dysphagia has also improved. She did have some pain and  more coughing after EGD, was given magic mouthwash and symptoms went away. She is taking her PPI/H2B PRN now, has not needed it in a few days. When she does have symptoms, she will have some heartburn and acid regurgitation. She also notes she was having some coughing and feeling like she had gurgling in the back of her throat with certain foods but this has not happened in the last couple of weeks since she has tried to be more active and has tried to eat slower.  No nausea or vomiting. Denies abdominal pain. No constipation or diarrhea. Denies rectal bleeding or melena.    Last Colonoscopy: 04/2019 [facility]-descending colon polyp, Tubular adenoma   Last Endoscopy:09/2022 - No endoscopic  esophageal abnormality to explain                            patient's dysphagia. Esophagus dilated. Dilated.                           - 1 cm hiatal hernia.                           - Normal stomach.                           - Normal examined duodenum.                           - No specimens collected.  Recommendations:  Repeat Colonoscopy in January 2026  Past Medical History:  Diagnosis Date   Allergy    Seasonal   Depression    GERD (gastroesophageal reflux disease)    Heart murmur 1965   Hiatal hernia    Hypertension 2023   Thyroid disease    Thyroid nodule 09/27/2015   Last Assessment & Plan:  Formatting of this note might be different from the original. Stable.  Her left lobe is normal in size on physical exam with no palpable nodule.    Past Surgical History:  Procedure Laterality Date   ABDOMINAL HYSTERECTOMY  2003   bladder tach     CESAREAN SECTION     COLONOSCOPY  04/2019   [place] - Dr. Roseanne Reno - repeat Jan 2025 per patient.   ESOPHAGOGASTRODUODENOSCOPY (EGD) WITH PROPOFOL N/A 10/09/2022   Procedure: ESOPHAGOGASTRODUODENOSCOPY (EGD) WITH PROPOFOL;  Surgeon: Dolores Frame, MD;  [facility];  Service: Gastroenterology;  Laterality: N/A;  12:30pm;asa 2   SAVORY DILATION  10/09/2022   Procedure: SAVORY DILATION;  Surgeon: Marguerita Merles, Reuel Boom, MD;  Location: [facility];  Service: Gastroenterology;;   SHOULDER SURGERY Bilateral 02/2019   THYROIDECTOMY, PARTIAL  10/2016   TUBAL LIGATION  1991    Current Outpatient Medications  Medication Sig Dispense Refill   estradiol (VIVELLE-DOT) 0.05 MG/24HR patch Place 1 patch (0.05 mg total) onto the skin 2 (two) times a week. 24 patch 3   famotidine (PEPCID) 20 MG tablet TAKE 1 TABLET(20 MG) BY MOUTH AT BEDTIME 90 tablet 0   levothyroxine (SYNTHROID) 75 MCG tablet TAKE 1 TABLET(75 MCG) BY MOUTH DAILY BEFORE BREAKFAST 90 tablet 1   Multiple Vitamins-Minerals (MULTI VITAMIN/MINERALS)  TABS Take 1 tablet by mouth daily.     pantoprazole (PROTONIX) 40 MG tablet TAKE 1 TABLET(40 MG) BY MOUTH DAILY 90 tablet 0   FLUoxetine (PROZAC) 20 MG capsule TAKE 1 CAPSULE(20 MG) BY MOUTH DAILY (Patient not taking: Reported on 02/03/2023) 30 capsule 3   losartan (COZAAR) 50 MG tablet Take 1 tablet (50 mg total) by mouth daily. (Patient not taking: Reported on 02/03/2023) 90 tablet 1   No current facility-administered medications for this visit.    Allergies as of 02/03/2023   (No Known Allergies)    Family History  Problem Relation Age of Onset   Hypertension Mother    Aortic aneurysm Mother    Heart disease Mother    Renal Disease Father    Alcohol abuse Father    Asthma Father    Heart disease Father    Colon cancer Sister    Cancer Sister    Early death Sister    Heart attack Sister    Heart disease Sister    Other Sister        tahycardia   Throat cancer Brother    Cancer Brother    Other Brother        heart valve surgery   Heart disease Sister     Social History   Socioeconomic History   Marital status: Married    Spouse name: Not on file   Number of children: Not on file   Years of education: Not on file   Highest education level: Associate degree: academic program  Occupational History   Not on file  Tobacco Use   Smoking status: Never    Passive exposure: Never   Smokeless tobacco: Never  Vaping Use   Vaping status: Never Used  Substance and Sexual Activity   Alcohol use: Not Currently   Drug use: Never   Sexual activity: Yes    Birth control/protection: Surgical    Comment: hyst  Other Topics Concern   Not on file  Social History Narrative   Not on file   Social Determinants of Health   Financial Resource Strain: Low Risk  (12/24/2022)   Overall Financial Resource Strain (CARDIA)    Difficulty of Paying Living Expenses: Not hard at all  Food Insecurity: No Food Insecurity (12/24/2022)   Hunger Vital Sign    Worried About Running Out  of  Food in the Last Year: Never true    Ran Out of Food in the Last Year: Never true  Transportation Needs: No Transportation Needs (12/24/2022)   PRAPARE - Administrator, Civil Service (Medical): No    Lack of Transportation (Non-Medical): No  Physical Activity: Insufficiently Active (12/24/2022)   Exercise Vital Sign    Days of Exercise per Week: 3 days    Minutes of Exercise per Session: 20 min  Stress: No Stress Concern Present (12/24/2022)   [employer] of Occupational Health - Occupational Stress Questionnaire    Feeling of Stress : Not at all  Social Connections: Unknown (12/24/2022)   Social Connection and Isolation Panel [NHANES]    Frequency of Communication with Friends and Family: Once a week    Frequency of Social Gatherings with Friends and Family: Once a week    Attends Religious Services: Patient declined    Database administrator or Organizations: No    Attends Engineer, structural: Never    Marital Status: Married  Recent Concern: Social Connections - Socially Isolated (12/24/2022)   Social Connection and Isolation Panel [NHANES]    Frequency of Communication with Friends and Family: Once a week    Frequency of Social Gatherings with Friends and Family: Once a week    Attends Religious Services: Never    Database administrator or Organizations: No    Attends Engineer, structural: Not on file    Marital Status: Married    Review of systems General: negative for malaise, night sweats, fever, chills, weight loss Neck: Negative for lumps, goiter, pain and significant neck swelling Resp: Negative for cough, wheezing, dyspnea at rest CV: Negative for chest pain, leg swelling, palpitations, orthopnea GI: denies melena, hematochezia, nausea, vomiting, diarrhea, constipation, dysphagia, odyonophagia, early satiety or unintentional weight loss.  Neuro: negative for tremor, gait imbalance, syncope and seizures. The remainder of the review of systems  is noncontributory.  Physical Exam: BP (!) 150/81   Pulse (!) 58   Temp 98.1 F (36.7 C) (Oral)   Ht 5\' 7"  (1.702 m)   Wt 202 lb 8 oz (91.9 kg)   LMP  (LMP Unknown)   BMI 31.72 kg/m  General:   Alert and oriented. No distress noted. Pleasant and cooperative.  Head:  Normocephalic and atraumatic. Eyes:  Conjuctiva clear without scleral icterus. Mouth:  Oral mucosa pink and moist. Good dentition. No lesions. Heart: Normal rate and rhythm, s1 and s2 heart sounds present.  Lungs: Clear lung sounds in all lobes. Respirations equal and unlabored. Abdomen:  +BS, soft, non-tender and non-distended. No rebound or guarding. No HSM or masses noted. Neurologic:  Alert and  oriented x4 Psych:  Alert and cooperative. Normal mood and affect.  Invalid input(s): "6 MONTHS"   ASSESSMENT: Karyl Sharrar is a 67 y.o. female presenting today for follow up of dysphagia and GERD  Dysphagia improved since EGD with dilation. She has changed her diet and implemented reflux precautions. Having much less GERD symptoms and only taking PPI PRN. I discussed continued lifestyle and dietary modifications with her, she can continue to use PPI PRN, however, if she starts to require dosing 2 or more times per week, would recommend resuming regular scheduled dosing of her PPI atleast every other day in order to keep GERD well controlled.   Family history of CRC/personal history of TAs: last TCS in January 2021 with 1 TA. Will be due for next colonoscopy in  January 2026. Denies rectal bleeding, melena, abdominal pain or changes in bowel habits.    PLAN:  Repeat Colonoscopy in January 2026  2.  Continue with dietary and lifestyle changes/reflux precautions  3. Will need to resume regular scheduled PPI dosing if having symptoms 2+ times per week  All questions were answered, patient verbalized understanding and is in agreement with plan as outlined above.    Follow Up: 6 months   Amazing Cowman L. Jeanmarie Hubert, MSN, APRN,  AGNP-C Adult-Gerontology Nurse Practitioner [facility]  I have reviewed the note and agree with the APP's assessment as described in this progress note  And continue discussion about cTIF in follow-up appointment if requiring to take medications regularly but interested in not taking medications chronically.  Katrinka Blazing, MD Gastroenterology and Hepatology [facility]

## 2023-02-03 NOTE — Patient Instructions (Signed)
Due for Repeat Colonoscopy in January 2026  Continue with dietary and lifestyle changes Be mindful of greasy, spicy, fried, citrus foods, caffeine, carbonated drinks, chocolate and alcohol as these can increase reflux symptoms Stay upright 2-3 hours after eating, prior to lying down and avoid eating late in the evenings.  If you are needing your acid reflux medication 2 or more times per week, would recommend you resume scheduled dosing of protonix atleast every other day.  Follow up 6 months  It was a pleasure to see you today. I want to create trusting relationships with patients and provide genuine, compassionate, and quality care. I truly value your feedback! please be on the lookout for a survey regarding your visit with me today. I appreciate your input about our visit and your time in completing this!    Kilan Banfill L. Jeanmarie Hubert, MSN, APRN, AGNP-C Adult-Gerontology Nurse Practitioner [facility] at [facility]

## 2023-02-03 NOTE — Progress Notes (Signed)
  Subjective:     Patient ID: Molly Knight, female   DOB: 1955/06/21, 67 y.o.   MRN: [id number]  HPI Molly Knight is a 67 year old white female, married, sp hysterectomy in complaining of vaginal discharge, and itching and burning. Has vaginal dryness and pain with sex.  PCP is Dr Allena Katz  Review of Systems +vaginal discharge +itching and burning in vaginal area +vaginal dryness, pain with sex Reviewed past medical,surgical, social and family history. Reviewed medications and allergies.     Objective:   Physical Exam BP 136/77 (BP Location: Left Arm, Patient Position: Sitting, Cuff Size: Normal)   Pulse 62   Ht 5\' 7"  (1.702 m)   Wt 203 lb 8 oz (92.3 kg)   LMP  (LMP Unknown)   BMI 31.87 kg/m     Skin warm and dry.Pelvic: external genitalia is normal in appearance no lesions, vagina: white discharge without odor,tissue is thin and dry,urethra has no lesions or masses noted, cervix and uterus are absent,adnexa: no masses or tenderness noted. Bladder is non tender and no masses felt. Wet prep: + yeast  Upstream - 02/03/23 1451       Pregnancy Intention Screening   Does the patient want to become pregnant in the next year? N/A    Does the patient's partner want to become pregnant in the next year? N/A    Would the patient like to discuss contraceptive options today? N/A      Contraception Wrap Up   Current Method Female Sterilization   hyst   End Method Female Sterilization   hyst   Contraception Counseling Provided No            Examination chaperoned by [place] Scear[employer] NP student  Assessment:     1. Vaginal discharge +white discharge, +yeast  - POCT Wet Prep [facility])  2. Itching in the vaginal area Has itching and burning inner labia area +yeast on wet prep will rx diflucan  - POCT Wet Prep [facility])  3. Vaginal dryness Will rx estrogen vaginal cream   Meds ordered this encounter  Medications   fluconazole (DIFLUCAN) 150 MG tablet    Sig: Take 1 tablet  (150 mg total) by mouth daily.    Dispense:  1 tablet    Refill:  1    Order Specific Question:   Supervising Provider    Answer:   Despina Hidden, LUTHER H [[id number]]   estradiol (ESTRACE) 0.1 MG/GM vaginal cream    Sig: Use 0.5 gm in the vagina for 2 weeks then 2-3 x weekly    Dispense:  42.5 g    Refill:  1    Order Specific Question:   Supervising Provider    Answer:   EURE, LUTHER H [[id number]]     4. S/P hysterectomy  5. Dyspareunia in female Will rx estrogen vaginal cream, use about 2-3 weeks before having sex   Use lubricate with sex  6. Yeast infection +yeast on wet prep Will rx diflucan 150 mg 1 now can repeat if needed  - POCT Wet Prep [facility])     Plan:     Follow up in about 8 weeks for recheck

## 2023-02-04 ENCOUNTER — Encounter (INDEPENDENT_AMBULATORY_CARE_PROVIDER_SITE_OTHER): Payer: Self-pay

## 2023-02-14 ENCOUNTER — Other Ambulatory Visit: Payer: Self-pay | Admitting: Adult Health

## 2023-03-19 LAB — TSH: TSH: 1.71 u[IU]/mL (ref 0.450–4.500)

## 2023-03-19 LAB — T4, FREE: Free T4: 1.15 ng/dL (ref 0.82–1.77)

## 2023-03-23 ENCOUNTER — Ambulatory Visit (INDEPENDENT_AMBULATORY_CARE_PROVIDER_SITE_OTHER): Payer: Medicare Other | Admitting: Internal Medicine

## 2023-03-23 ENCOUNTER — Encounter: Payer: Self-pay | Admitting: Internal Medicine

## 2023-03-23 VITALS — BP 136/76 | HR 68 | Ht 67.0 in | Wt 202.4 lb

## 2023-03-23 DIAGNOSIS — K219 Gastro-esophageal reflux disease without esophagitis: Secondary | ICD-10-CM | POA: Diagnosis not present

## 2023-03-23 DIAGNOSIS — I10 Essential (primary) hypertension: Secondary | ICD-10-CM

## 2023-03-23 DIAGNOSIS — E782 Mixed hyperlipidemia: Secondary | ICD-10-CM

## 2023-03-23 DIAGNOSIS — E890 Postprocedural hypothyroidism: Secondary | ICD-10-CM | POA: Diagnosis not present

## 2023-03-23 DIAGNOSIS — R7303 Prediabetes: Secondary | ICD-10-CM

## 2023-03-23 DIAGNOSIS — M5432 Sciatica, left side: Secondary | ICD-10-CM

## 2023-03-23 MED ORDER — PREDNISONE 20 MG PO TABS: 20.0000 mg | ORAL_TABLET | Freq: Every day | ORAL | 0 refills | Status: DC

## 2023-03-23 MED ORDER — PANTOPRAZOLE SODIUM 40 MG PO TBEC
40.0000 mg | DELAYED_RELEASE_TABLET | Freq: Every day | ORAL | 1 refills | Status: DC
Start: 2023-03-23 — End: 2024-02-10

## 2023-03-23 NOTE — Assessment & Plan Note (Signed)
BP Readings from Last 1 Encounters:  03/23/23 136/76   Well-controlled with losartan 50 mg QD Advised DASH diet and moderate exercise/walking, at least 150 mins/week Advised to check BP at home and contact if BP > 140/90 persistently

## 2023-03-23 NOTE — Patient Instructions (Addendum)
Please take Prednisone 20 mg once daily.  Please continue to take medications as prescribed.  Please continue to follow low carb diet and perform moderate exercise/walking at least 150 mins/week.  Please get fasting blood tests done before the next visit.

## 2023-03-23 NOTE — Assessment & Plan Note (Addendum)
Lipid profile reviewed On Crestor 10 mg QD

## 2023-03-23 NOTE — Assessment & Plan Note (Addendum)
Likely has underlying hip OA Prednisone 20 mg x 5 days Avoid heavy lifting and frequent bending Warm compresses locally Followed by orthopedic surgery

## 2023-03-23 NOTE — Progress Notes (Signed)
Established Patient Office Visit  Subjective:  Patient ID: Molly Knight, female    DOB: 1955-09-19  Age: 67 y.o. MRN: [id number]  CC:  Chief Complaint  Patient presents with   Hypertension    Four month follow up    Depression    Four month follow up     HPI Molly Knight is a 67 y.o. female with past medical history of postoperative hypothyroidism, hot flashes and depression who presents for f/u of her chronic medical conditions.  BP is wnl today.  She takes losartan 50 mg QD regularly.  Patient denies dizziness, chest pain, dyspnea or palpitations. She was referred to cardiology from ER and had cardiac stress test, which was unremarkable.  She has not had any episode of chest pain or dyspnea since then.  She has been placed on Crestor 10 mg QD for HLD.  Hypothyroidism: She takes levothyroxine 75 mcg QD.  She denies chronic fatigue, recent change in weight or appetite, tremors or palpitations.  She has been taking Prozac and uses estradiol patch for hot flashes. She has mild hot flashes, but is manageable for now.  She has acute on chronic low back pain since 03/17/23, sharp, radiating to LLE.  Denies any recent injury.  She used to take naproxen for it, but tries to avoid it due to GERD.  She has had difficulty walking due to severe low back pain at times.  She has history of perforated tympanic membrane, which required surgery.  Her ear drainage has resolved now, but still has mild left sided ear discomfort.  Denies any fever or chills.  She is planned to see ENT specialist for tympanoplasty with graft.   Past Medical History:  Diagnosis Date   Allergy    Seasonal   Depression    GERD (gastroesophageal reflux disease)    Heart murmur 1965   Hiatal hernia    Hypertension 2023   Thyroid disease    Thyroid nodule 09/27/2015   Last Assessment & Plan:  Formatting of this note might be different from the original. Stable.  Her left lobe is normal in size on physical  exam with no palpable nodule.    Past Surgical History:  Procedure Laterality Date   ABDOMINAL HYSTERECTOMY  2003   bladder tach     CESAREAN SECTION     COLONOSCOPY  04/2019   [place] - Dr. Roseanne Reno - repeat Jan 2025 per patient.   ESOPHAGOGASTRODUODENOSCOPY (EGD) WITH PROPOFOL N/A 10/09/2022   Procedure: ESOPHAGOGASTRODUODENOSCOPY (EGD) WITH PROPOFOL;  Surgeon: Dolores Frame, MD;  Location: [facility];  Service: Gastroenterology;  Laterality: N/A;  12:30pm;asa 2   SAVORY DILATION  10/09/2022   Procedure: SAVORY DILATION;  Surgeon: Marguerita Merles, Reuel Boom, MD;  Location: [facility];  Service: Gastroenterology;;   SHOULDER SURGERY Bilateral 02/2019   THYROIDECTOMY, PARTIAL  10/2016   TUBAL LIGATION  1991    Family History  Problem Relation Age of Onset   Hypertension Mother    Aortic aneurysm Mother    Heart disease Mother    Renal Disease Father    Alcohol abuse Father    Asthma Father    Heart disease Father    Colon cancer Sister    Cancer Sister    Early death Sister    Heart attack Sister    Heart disease Sister    Other Sister        tahycardia   Throat cancer Brother    Cancer Brother  Other Brother        heart valve surgery   Heart disease Sister     Social History   Socioeconomic History   Marital status: Married    Spouse name: Not on file   Number of children: Not on file   Years of education: Not on file   Highest education level: Associate degree: academic program  Occupational History   Not on file  Tobacco Use   Smoking status: Never    Passive exposure: Never   Smokeless tobacco: Never  Vaping Use   Vaping status: Never Used  Substance and Sexual Activity   Alcohol use: Not Currently   Drug use: Never   Sexual activity: Not Currently    Birth control/protection: Surgical    Comment: hyst  Other Topics Concern   Not on file  Social History Narrative   Not on file   Social Determinants of Health    Financial Resource Strain: Low Risk  (03/20/2023)   Overall Financial Resource Strain (CARDIA)    Difficulty of Paying Living Expenses: Not hard at all  Food Insecurity: No Food Insecurity (03/20/2023)   Hunger Vital Sign    Worried About Running Out of Food in the Last Year: Never true    Ran Out of Food in the Last Year: Never true  Transportation Needs: Patient Declined (03/20/2023)   PRAPARE - Transportation    Lack of Transportation (Medical): Patient declined    Lack of Transportation (Non-Medical): Patient declined  Physical Activity: Inactive (03/20/2023)   Exercise Vital Sign    Days of Exercise per Week: 0 days    Minutes of Exercise per Session: 20 min  Stress: Patient Declined (03/20/2023)   [employer] of Occupational Health - Occupational Stress Questionnaire    Feeling of Stress : Patient declined  Social Connections: Unknown (03/20/2023)   Social Connection and Isolation Panel [NHANES]    Frequency of Communication with Friends and Family: Three times a week    Frequency of Social Gatherings with Friends and Family: Patient declined    Attends Religious Services: Patient declined    Database administrator or Organizations: Yes    Attends Banker Meetings: 1 to 4 times per year    Marital Status: Married  Recent Concern: Social Connections - Socially Isolated (12/24/2022)   Social Connection and Isolation Panel [NHANES]    Frequency of Communication with Friends and Family: Once a week    Frequency of Social Gatherings with Friends and Family: Once a week    Attends Religious Services: Never    Database administrator or Organizations: No    Attends Engineer, structural: Not on file    Marital Status: Married  Catering manager Violence: Not At Risk (12/31/2022)   Humiliation, Afraid, Rape, and Kick questionnaire    Fear of Current or Ex-Partner: No    Emotionally Abused: No    Physically Abused: No    Sexually Abused: No     Outpatient Medications Prior to Visit  Medication Sig Dispense Refill   rosuvastatin (CRESTOR) 10 MG tablet Take 10 mg by mouth daily.     estradiol (ESTRACE) 0.1 MG/GM vaginal cream Use 0.5 gm in the vagina for 2 weeks then 2-3 x weekly 42.5 g 1   estradiol (VIVELLE-DOT) 0.05 MG/24HR patch Place 1 patch (0.05 mg total) onto the skin 2 (two) times a week. 24 patch 3   famotidine (PEPCID) 20 MG tablet TAKE 1 TABLET(20 MG) BY MOUTH  AT BEDTIME 90 tablet 0   fluconazole (DIFLUCAN) 150 MG tablet TAKE 1 TABLET(150 MG) BY MOUTH DAILY 1 tablet 1   FLUoxetine (PROZAC) 20 MG capsule TAKE 1 CAPSULE(20 MG) BY MOUTH DAILY 30 capsule 3   levothyroxine (SYNTHROID) 75 MCG tablet TAKE 1 TABLET(75 MCG) BY MOUTH DAILY BEFORE BREAKFAST 90 tablet 1   losartan (COZAAR) 50 MG tablet Take 1 tablet (50 mg total) by mouth daily. 90 tablet 1   Multiple Vitamins-Minerals (MULTI VITAMIN/MINERALS) TABS Take 1 tablet by mouth daily.     pantoprazole (PROTONIX) 40 MG tablet TAKE 1 TABLET(40 MG) BY MOUTH DAILY 90 tablet 0   No facility-administered medications prior to visit.    No Known Allergies  ROS Review of Systems  Constitutional:  Negative for chills and fever.  HENT:  Negative for congestion, ear discharge, sinus pressure, sinus pain and sore throat.   Eyes:  Negative for pain and discharge.  Respiratory:  Negative for cough and shortness of breath.   Cardiovascular:  Negative for chest pain and palpitations.  Gastrointestinal:  Negative for abdominal pain, diarrhea, nausea and vomiting.  Endocrine: Negative for polydipsia and polyuria.  Genitourinary:  Negative for dysuria and hematuria.  Musculoskeletal:  Positive for arthralgias and back pain. Negative for neck pain and neck stiffness.  Skin:  Negative for rash.  Neurological:  Negative for dizziness and weakness.  Psychiatric/Behavioral:  Negative for agitation and behavioral problems.       Objective:    Physical Exam Constitutional:       General: She is not in acute distress.    Appearance: She is not diaphoretic.  HENT:     Head: Normocephalic and atraumatic.     Nose: No congestion.     Mouth/Throat:     Mouth: Mucous membranes are moist.     Pharynx: No posterior oropharyngeal erythema.  Eyes:     Extraocular Movements: Extraocular movements intact.  Cardiovascular:     Rate and Rhythm: Normal rate and regular rhythm.     Pulses: Normal pulses.     Heart sounds: Normal heart sounds. No murmur heard. Pulmonary:     Breath sounds: Normal breath sounds. No wheezing or rales.  Musculoskeletal:     Lumbar back: Tenderness present. Decreased range of motion. Positive left straight leg raise test.     Right lower leg: No edema.     Left lower leg: No edema.  Skin:    General: Skin is warm.     Findings: No rash.  Neurological:     General: No focal deficit present.     Mental Status: She is alert and oriented to person, place, and time.  Psychiatric:        Mood and Affect: Mood normal.        Behavior: Behavior normal.     BP 136/76 (BP Location: Left Arm)   Pulse 68   Ht 5\' 7"  (1.702 m)   Wt 202 lb 6.4 oz (91.8 kg)   LMP  (LMP Unknown)   SpO2 97%   BMI 31.70 kg/m  Wt Readings from Last 3 Encounters:  03/23/23 202 lb 6.4 oz (91.8 kg)  02/03/23 203 lb 8 oz (92.3 kg)  02/03/23 202 lb 8 oz (91.9 kg)    Lab Results  Component Value Date   TSH 1.710 03/18/2023   Lab Results  Component Value Date   WBC 6.7 11/14/2022   HGB 13.1 11/14/2022   HCT 41.3 11/14/2022   MCV 93 11/14/2022  PLT 328 11/14/2022   Lab Results  Component Value Date   NA 136 11/14/2022   K 4.5 11/14/2022   CO2 25 11/14/2022   GLUCOSE 89 11/14/2022   BUN 10 11/14/2022   CREATININE 0.74 11/14/2022   BILITOT 0.3 11/14/2022   ALKPHOS 81 11/14/2022   AST 20 11/14/2022   ALT 13 11/14/2022   PROT 6.3 11/14/2022   ALBUMIN 4.1 11/14/2022   CALCIUM 9.4 11/14/2022   ANIONGAP 7 11/09/2022   EGFR 90 11/14/2022   Lab Results   Component Value Date   CHOL 219 (H) 11/14/2022   Lab Results  Component Value Date   HDL 65 11/14/2022   Lab Results  Component Value Date   LDLCALC 138 (H) 11/14/2022   Lab Results  Component Value Date   TRIG 88 11/14/2022   Lab Results  Component Value Date   CHOLHDL 3.4 11/14/2022   Lab Results  Component Value Date   HGBA1C 6.2 (H) 11/14/2022      Assessment & Plan:   Problem List Items Addressed This Visit       Cardiovascular and Mediastinum   Essential hypertension - Primary (Chronic)    BP Readings from Last 1 Encounters:  03/23/23 136/76   Well-controlled with losartan 50 mg QD Advised DASH diet and moderate exercise/walking, at least 150 mins/week Advised to check BP at home and contact if BP > 140/90 persistently      Relevant Medications   rosuvastatin (CRESTOR) 10 MG tablet     Digestive   Gastroesophageal reflux disease    Well-controlled with Pantoprazole 40 mg once daily now      Relevant Medications   pantoprazole (PROTONIX) 40 MG tablet     Endocrine   Postoperative hypothyroidism    Due to h/o multinodular goiter Lab Results  Component Value Date   TSH 1.710 03/18/2023   Takes Levothyroxine 75 mcg QD        Nervous and Auditory   Sciatica of left side    Likely has underlying hip OA Prednisone 20 mg x 5 days Avoid heavy lifting and frequent bending Warm compresses locally Followed by orthopedic surgery      Relevant Medications   predniSONE (DELTASONE) 20 MG tablet     Other   Mixed hyperlipidemia (Chronic)    Lipid profile reviewed On Crestor 10 mg QD      Relevant Medications   rosuvastatin (CRESTOR) 10 MG tablet   Other Relevant Orders   Lipid Profile   CMP14+EGFR   Prediabetes    Lab Results  Component Value Date   HGBA1C 6.2 (H) 11/14/2022   Advised to follow low-carb diet      Relevant Orders   CMP14+EGFR   Hemoglobin A1c     Meds ordered this encounter  Medications   pantoprazole  (PROTONIX) 40 MG tablet    Sig: Take 1 tablet (40 mg total) by mouth daily.    Dispense:  90 tablet    Refill:  1    **Patient requests 90 days supply**   predniSONE (DELTASONE) 20 MG tablet    Sig: Take 1 tablet (20 mg total) by mouth daily with breakfast.    Dispense:  5 tablet    Refill:  0    Follow-up: Return in about 4 months (around 07/22/2023) for HTN and HLD.    Anabel Halon, MD

## 2023-03-23 NOTE — Assessment & Plan Note (Signed)
Due to h/o multinodular goiter Lab Results  Component Value Date   TSH 1.710 03/18/2023   Takes Levothyroxine 75 mcg QD

## 2023-03-23 NOTE — Assessment & Plan Note (Signed)
Lab Results  Component Value Date   HGBA1C 6.2 (H) 11/14/2022   Advised to follow low-carb diet

## 2023-03-23 NOTE — Assessment & Plan Note (Signed)
Well-controlled with Pantoprazole 40 mg once daily now

## 2023-03-24 ENCOUNTER — Encounter: Payer: Self-pay | Admitting: Internal Medicine

## 2023-03-26 ENCOUNTER — Ambulatory Visit (INDEPENDENT_AMBULATORY_CARE_PROVIDER_SITE_OTHER): Payer: Medicare Other | Admitting: "Endocrinology

## 2023-03-26 ENCOUNTER — Encounter: Payer: Self-pay | Admitting: "Endocrinology

## 2023-03-26 VITALS — BP 134/76 | HR 60 | Ht 67.0 in | Wt 203.2 lb

## 2023-03-26 DIAGNOSIS — E782 Mixed hyperlipidemia: Secondary | ICD-10-CM

## 2023-03-26 DIAGNOSIS — R7303 Prediabetes: Secondary | ICD-10-CM | POA: Diagnosis not present

## 2023-03-26 DIAGNOSIS — E890 Postprocedural hypothyroidism: Secondary | ICD-10-CM | POA: Diagnosis not present

## 2023-03-26 LAB — POCT GLYCOSYLATED HEMOGLOBIN (HGB A1C): HbA1c, POC (controlled diabetic range): 5.9 % (ref 0.0–7.0)

## 2023-03-26 MED ORDER — LEVOTHYROXINE SODIUM 75 MCG PO TABS
75.0000 ug | ORAL_TABLET | Freq: Every day | ORAL | 1 refills | Status: DC
Start: 2023-03-26 — End: 2023-06-24

## 2023-03-26 NOTE — Progress Notes (Signed)
03/26/2023, 12:09 PM   Endocrinology follow-up note  Subjective:    Patient ID: Molly Knight, female    DOB: 07-07-1955, PCP Anabel Halon, MD   Past Medical History:  Diagnosis Date   Allergy    Seasonal   Depression    GERD (gastroesophageal reflux disease)    Heart murmur 1965   Hiatal hernia    Hypertension 2023   Thyroid disease    Thyroid nodule 09/27/2015   Last Assessment & Plan:  Formatting of this note might be different from the original. Stable.  Her left lobe is normal in size on physical exam with no palpable nodule.   Past Surgical History:  Procedure Laterality Date   ABDOMINAL HYSTERECTOMY  2003   bladder tach     CESAREAN SECTION     COLONOSCOPY  04/2019   [place] - Dr. Roseanne Reno - repeat Jan 2025 per patient.   ESOPHAGOGASTRODUODENOSCOPY (EGD) WITH PROPOFOL N/A 10/09/2022   Procedure: ESOPHAGOGASTRODUODENOSCOPY (EGD) WITH PROPOFOL;  Surgeon: Dolores Frame, MD;  [facility]: [facility];  Service: Gastroenterology;  Laterality: N/A;  12:30pm;asa 2   SAVORY DILATION  10/09/2022   Procedure: SAVORY DILATION;  Surgeon: Marguerita Merles, Reuel Boom, MD;  Location: [facility];  Service: Gastroenterology;;   SHOULDER SURGERY Bilateral 02/2019   THYROIDECTOMY, PARTIAL  10/2016   TUBAL LIGATION  1991   Social History   Socioeconomic History   Marital status: Married    Spouse name: Not on file   Number of children: Not on file   Years of education: Not on file   Highest education level: Associate degree: academic program  Occupational History   Not on file  Tobacco Use   Smoking status: Never    Passive exposure: Never   Smokeless tobacco: Never  Vaping Use   Vaping status: Never Used  Substance and Sexual Activity   Alcohol use: Not Currently   Drug use: Never   Sexual activity: Not Currently    Birth control/protection: Surgical    Comment: hyst  Other Topics Concern    Not on file  Social History Narrative   Not on file   Social Determinants of Health   Financial Resource Strain: Low Risk  (03/20/2023)   Overall Financial Resource Strain (CARDIA)    Difficulty of Paying Living Expenses: Not hard at all  Food Insecurity: No Food Insecurity (03/20/2023)   Hunger Vital Sign    Worried About Running Out of Food in the Last Year: Never true    Ran Out of Food in the Last Year: Never true  Transportation Needs: Patient Declined (03/20/2023)   PRAPARE - Transportation    Lack of Transportation (Medical): Patient declined    Lack of Transportation (Non-Medical): Patient declined  Physical Activity: Inactive (03/20/2023)   Exercise Vital Sign    Days of Exercise per Week: 0 days    Minutes of Exercise per Session: 20 min  Stress: Patient Declined (03/20/2023)   [employer] of Occupational Health - Occupational Stress Questionnaire    Feeling of Stress : Patient declined  Social Connections: Unknown (03/20/2023)   Social Connection and Isolation Panel [NHANES]    Frequency of Communication with Friends and Family: Three times a  week    Frequency of Social Gatherings with Friends and Family: Patient declined    Attends Religious Services: Patient declined    Active Member of Clubs or Organizations: Yes    Attends Engineer, structural: 1 to 4 times per year    Marital Status: Married  Recent Concern: Social Connections - Socially Isolated (12/24/2022)   Social Connection and Isolation Panel [NHANES]    Frequency of Communication with Friends and Family: Once a week    Frequency of Social Gatherings with Friends and Family: Once a week    Attends Religious Services: Never    Database administrator or Organizations: No    Attends Engineer, structural: Not on file    Marital Status: Married   Family History  Problem Relation Age of Onset   Hypertension Mother    Aortic aneurysm Mother    Heart disease Mother    Renal Disease  Father    Alcohol abuse Father    Asthma Father    Heart disease Father    Colon cancer Sister    Cancer Sister    Early death Sister    Heart attack Sister    Heart disease Sister    Other Sister        tahycardia   Throat cancer Brother    Cancer Brother    Other Brother        heart valve surgery   Heart disease Sister    Outpatient Encounter Medications as of 03/26/2023  Medication Sig   estradiol (ESTRACE) 0.1 MG/GM vaginal cream Use 0.5 gm in the vagina for 2 weeks then 2-3 x weekly   estradiol (VIVELLE-DOT) 0.05 MG/24HR patch Place 1 patch (0.05 mg total) onto the skin 2 (two) times a week.   famotidine (PEPCID) 20 MG tablet TAKE 1 TABLET(20 MG) BY MOUTH AT BEDTIME   fluconazole (DIFLUCAN) 150 MG tablet TAKE 1 TABLET(150 MG) BY MOUTH DAILY   FLUoxetine (PROZAC) 20 MG capsule TAKE 1 CAPSULE(20 MG) BY MOUTH DAILY   levothyroxine (SYNTHROID) 75 MCG tablet Take 1 tablet (75 mcg total) by mouth daily before breakfast.   losartan (COZAAR) 50 MG tablet Take 1 tablet (50 mg total) by mouth daily.   Multiple Vitamins-Minerals (MULTI VITAMIN/MINERALS) TABS Take 1 tablet by mouth daily.   pantoprazole (PROTONIX) 40 MG tablet Take 1 tablet (40 mg total) by mouth daily.   predniSONE (DELTASONE) 20 MG tablet Take 1 tablet (20 mg total) by mouth daily with breakfast.   rosuvastatin (CRESTOR) 10 MG tablet Take 10 mg by mouth daily.   [DISCONTINUED] levothyroxine (SYNTHROID) 75 MCG tablet TAKE 1 TABLET(75 MCG) BY MOUTH DAILY BEFORE BREAKFAST   No facility-administered encounter medications on file as of 03/26/2023.   ALLERGIES: No Known Allergies  VACCINATION STATUS: Immunization History  Administered Date(s) Administered   Fluad Quad(high Dose 65+) 12/31/2022   Influenza,inj,Quad PF,6+ Mos 01/23/2019, 01/10/2020, 12/18/2020, 01/09/2022   Moderna Covid-19 Vaccine Bivalent Booster 28yrs & up 03/01/2021   Moderna Sars-Covid-2 Vaccination 05/17/2019, 05/20/2019, 06/17/2019, 04/04/2020    PNEUMOCOCCAL CONJUGATE-20 05/13/2022   Tdap 03/24/2014   Unspecified SARS-COV-2 Vaccination 01/29/2023   Zoster Recombinant(Shingrix) 08/07/2017, 10/16/2017    HPI Molly Knight is 67 y.o. female who presents today with a medical history as above. she is being seen in follow-up after she was seen in consultation for postsurgical hypothyroidism requested by Anabel Halon, MD.    She is known to have multinodular goiter prior to 2018 when she underwent  right thyroid Lobectomy in [place].  Her surgical report shows benign hyperplastic follicular nodule with partial cystic degeneration and fibrosis.   She was started on levothyroxine thyroid hormone replacement over subsequent years.  She is currently on venlafaxine 75 mg p.o. daily before breakfast. She reports much better consistency taking this medication.  Her previsit thyroid/neck ultrasound documents 0.8 cm thyroid residual right thyroid bed, and 3 cm left thyroid Lobe is no nodules and no cervical lymphadenopathy.  Her most recent thyroid function tests are consistent with appropriate replacement.  She denies dysphagia, shortness breath, nor voice change. She denies any family history of thyroid malignancy.  Her other medical problems include hyperlipidemia, prediabetes.  She is on Crestor 10 mg p.o. nightly.  She is on ongoing calcium/vitamin D supplement. She denies palpitations, tremors.  She reports mild heat intolerance. She is not following any particular dietary program for exercise program.   Review of Systems  Constitutional: + Progressively gaining weight,  no fatigue, no subjective hyperthermia, no subjective hypothermia Eyes: no blurry vision, no xerophthalmia ENT: no sore throat, no nodules palpated in throat, no dysphagia/odynophagia, no hoarseness   Objective:       03/26/2023    8:17 AM 03/23/2023    8:49 AM 03/23/2023    8:16 AM  Vitals with BMI  Height 5\' 7"   5\' 7"   Weight 203 lbs 3 oz  202 lbs 6 oz  BMI  31.82  31.69  Systolic 134 136 782  Diastolic 76 76 74  Pulse 60  68    BP 134/76   Pulse 60   Ht 5\' 7"  (1.702 m)   Wt 203 lb 3.2 oz (92.2 kg)   LMP  (LMP Unknown)   BMI 31.83 kg/m   Wt Readings from Last 3 Encounters:  03/26/23 203 lb 3.2 oz (92.2 kg)  03/23/23 202 lb 6.4 oz (91.8 kg)  02/03/23 203 lb 8 oz (92.3 kg)    Physical Exam  Constitutional:  Body mass index is 31.83 kg/m.,  not in acute distress, normal state of mind Eyes: PERRLA, EOMI, no exophthalmos ENT: moist mucous membranes, + scar from her remote past surgery, no gross thyromegaly, no gross cervical lymphadenopathy   CMP ( most recent) CMP     Component Value Date/Time   NA 136 11/14/2022 0930   K 4.5 11/14/2022 0930   CL 98 11/14/2022 0930   CO2 25 11/14/2022 0930   GLUCOSE 89 11/14/2022 0930   GLUCOSE 145 (H) 11/09/2022 1119   BUN 10 11/14/2022 0930   CREATININE 0.74 11/14/2022 0930   CALCIUM 9.4 11/14/2022 0930   PROT 6.3 11/14/2022 0930   ALBUMIN 4.1 11/14/2022 0930   AST 20 11/14/2022 0930   ALT 13 11/14/2022 0930   ALKPHOS 81 11/14/2022 0930   BILITOT 0.3 11/14/2022 0930   EGFR 90 11/14/2022 0930   GFRNONAA >60 11/09/2022 1119     Diabetic Labs (most recent): Lab Results  Component Value Date   HGBA1C 5.9 03/26/2023   HGBA1C 6.2 (H) 11/14/2022   HGBA1C 5.7 (H) 06/10/2021     Lipid Panel ( most recent) Lipid Panel     Component Value Date/Time   CHOL 219 (H) 11/14/2022 0930   TRIG 88 11/14/2022 0930   HDL 65 11/14/2022 0930   CHOLHDL 3.4 11/14/2022 0930   LDLCALC 138 (H) 11/14/2022 0930   LABVLDL 16 11/14/2022 0930      Lab Results  Component Value Date   TSH 1.710 03/18/2023   TSH  1.840 11/14/2022   TSH 1.920 01/24/2022   TSH 2.110 06/10/2021   TSH 1.570 09/13/2020   FREET4 1.15 03/18/2023   FREET4 1.33 11/14/2022   FREET4 1.26 01/24/2022   FREET4 1.08 06/10/2021     Previously thyroid/neck ultrasound on December 15, 2022 0.8 x 0.8 cm echogenic tissue in the  right thyroidectomy bed is suspicious for residual thyroid tissue. No nodule is identified.   IMPRESSION: 1. 0.8 x 0.8 cm echogenic tissue in the right thyroidectomy bed is suspicious for residual thyroid tissue. No thyroid nodules are identified. 2. No enlarged neck lymph nodes.   Assessment & Plan:   1. Postoperative hypothyroidism 2. Mixed hyperlipidemia 3. Prediabetes-point-of-care A1c today 5.9%   she has a right thyroid lobectomy in 2018 with subsequent postsurgical hypothyroidism. Her previsit thyroid ultrasound is unremarkable except that she has a 3 cm left lobe of the thyroid.  Her previsit thyroid function tests are consistent with appropriate replacement with levothyroxine 75 mg p.o. daily before breakfast.   - We discussed about the correct intake of her thyroid hormone, on empty stomach at fasting, with water, separated by at least 30 minutes from breakfast and other medications,  and separated by more than 4 hours from calcium, iron, multivitamins, acid reflux medications (PPIs). -Patient is made aware of the fact that thyroid hormone replacement is needed for life, dose to be adjusted by periodic monitoring of thyroid function tests.  In light of her metabolic dysfunction indicated by hyperlipidemia and prediabetes, she is advised to continue Crestor 10 mg p.o. nightly.  Her point-of-care A1c is 5.9%.  She would not need pharmaceutical intervention for now.   - she acknowledges that there is a room for improvement in her food and drink choices. - Suggestion is made for her to avoid simple carbohydrates  from her diet including Cakes, Sweet Desserts, Ice Cream, Soda (diet and regular), Sweet Tea, Candies, Chips, Cookies, Store Bought Juices, Alcohol , Artificial Sweeteners,  Coffee Creamer, and "Sugar-free" Products, Lemonade. This will help patient to have more stable blood glucose profile and potentially avoid unintended weight gain.   - she is advised to maintain  close follow up with Anabel Halon, MD for primary care needs.   I spent  25  minutes in the care of the patient today including review of labs from Thyroid Function, CMP, and other relevant labs ; imaging/biopsy records (current and previous including abstractions from other facilities); face-to-face time discussing  her lab results and symptoms, medications doses, her options of short and long term treatment based on the latest standards of care / guidelines;   and documenting the encounter.  Molly Knight  participated in the discussions, expressed understanding, and voiced agreement with the above plans.  All questions were answered to her satisfaction. she is encouraged to contact clinic should she have any questions or concerns prior to her return visit.   Follow up plan: Return in about 6 months (around 09/24/2023) for Fasting Labs  in AM B4 8, A1c -NV.   Marquis Lunch, MD [facility] [address] [facility] [address] [place], [place] [address] Phone: [phone]  Fax: [phone]     03/26/2023, 12:09 PM  This note was partially dictated with voice recognition software. Similar sounding words can be transcribed inadequately or may not  be corrected upon review.

## 2023-03-26 NOTE — Patient Instructions (Signed)
                                     Advice for Weight Management  -For most of us the best way to lose weight is by diet management. Generally speaking, diet management means consuming less calories intentionally which over time brings about progressive weight loss.  This can be achieved more effectively by avoiding ultra processed carbohydrates, processed meats, unhealthy fats.    It is critically important to know your numbers: how much calorie you are consuming and how much calorie you need. More importantly, our carbohydrates sources should be unprocessed naturally occurring  complex starch food items.  It is always important to balance nutrition also by  appropriate intake of proteins (mainly plant-based), healthy fats/oils, plenty of fruits and vegetables.   -The American College of Lifestyle Medicine (ACL M) recommends nutrition derived mostly from Whole Food, Plant Predominant Sources example an apple instead of applesauce or apple pie. Eat Plenty of vegetables, Mushrooms, fruits, Legumes, Whole Grains, Nuts, seeds in lieu of processed meats, processed snacks/pastries red meat, poultry, eggs.  Use only water or unsweetened tea for hydration.  The College also recommends the need to stay away from risky substances including alcohol, smoking; obtaining 7-9 hours of restorative sleep, at least 150 minutes of moderate intensity exercise weekly, importance of healthy social connections, and being mindful of stress and seek help when it is overwhelming.    -Sticking to a routine mealtime to eat 3 meals a day and avoiding unnecessary snacks is shown to have a big role in weight control. Under normal circumstances, the only time we burn stored energy is when we are hungry, so allow  some hunger to take place- hunger means no food between appropriate meal times, only water.  It is not advisable to starve.   -It is better to avoid simple carbohydrates including:  Cakes, Sweet Desserts, Ice Cream, Soda (diet and regular), Sweet Tea, Candies, Chips, Cookies, Store Bought Juices, Alcohol in Excess of  1-2 drinks a day, Lemonade,  Artificial Sweeteners, Doughnuts, Coffee Creamers, "Sugar-free" Products, etc, etc.  This is not a complete list.....    -Consulting with certified diabetes educators is proven to provide you with the most accurate and current information on diet.  Also, you may be  interested in discussing diet options/exchanges , we can schedule a visit with Molly Knight, RDN, CDE for individualized nutrition education.  -Exercise: If you are able: 30 -60 minutes a day ,4 days a week, or 150 minutes of moderate intensity exercise weekly.    The longer the better if tolerated.  Combine stretch, strength, and aerobic activities.  If you were told in the past that you have high risk for cardiovascular diseases, or if you are currently symptomatic, you may seek evaluation by your heart doctor prior to initiating moderate to intense exercise programs.                                  Additional Care Considerations for Diabetes/Prediabetes   -Diabetes  is a chronic disease.  The most important care consideration is regular follow-up with your diabetes care provider with the goal being avoiding or delaying its complications and to take advantage of advances in medications and technology.  If appropriate actions are taken early enough, type 2 diabetes can even be   reversed.  Seek information from the right source.  - Whole Food, Plant Predominant Nutrition is highly recommended: Eat Plenty of vegetables, Mushrooms, fruits, Legumes, Whole Grains, Nuts, seeds in lieu of processed meats, processed snacks/pastries red meat, poultry, eggs as recommended by American College of  Lifestyle Medicine (ACLM).  -Type 2 diabetes is known to coexist with other important comorbidities such as high blood pressure and high cholesterol.  It is critical to control not only the  diabetes but also the high blood pressure and high cholesterol to minimize and delay the risk of complications including coronary artery disease, stroke, amputations, blindness, etc.  The good news is that this diet recommendation for type 2 diabetes is also very helpful for managing high cholesterol and high blood blood pressure.  - Studies showed that people with diabetes will benefit from a class of medications known as ACE inhibitors and statins.  Unless there are specific reasons not to be on these medications, the standard of care is to consider getting one from these groups of medications at an optimal doses.  These medications are generally considered safe and proven to help protect the heart and the kidneys.    - People with diabetes are encouraged to initiate and maintain regular follow-up with eye doctors, foot doctors, dentists , and if necessary heart and kidney doctors.     - It is highly recommended that people with diabetes quit smoking or stay away from smoking, and get yearly  flu vaccine and pneumonia vaccine at least every 5 years.  See above for additional recommendations on exercise, sleep, stress management , and healthy social connections.      

## 2023-03-31 ENCOUNTER — Encounter: Payer: Self-pay | Admitting: Adult Health

## 2023-03-31 ENCOUNTER — Ambulatory Visit (INDEPENDENT_AMBULATORY_CARE_PROVIDER_SITE_OTHER): Payer: Medicare Other | Admitting: Adult Health

## 2023-03-31 VITALS — BP 132/78 | HR 58 | Ht 67.0 in | Wt 201.0 lb

## 2023-03-31 DIAGNOSIS — N898 Other specified noninflammatory disorders of vagina: Secondary | ICD-10-CM

## 2023-03-31 DIAGNOSIS — Z90710 Acquired absence of both cervix and uterus: Secondary | ICD-10-CM | POA: Diagnosis not present

## 2023-03-31 NOTE — Progress Notes (Signed)
  Subjective:     Patient ID: Molly Knight, female   DOB: 06-13-1955, 67 y.o.   MRN: [id number]  HPI Molly Knight is a 67 year old white female, married, sp hysterectomy back in follow up on itching and vaginal dryness, is using estrace vaginal cream now, and feels much better.  PCP is Dr Allena Katz.   Review of Systems No itching Vaginal feels better Has not had sex yet Reviewed past medical,surgical, social and family history. Reviewed medications and allergies.     Objective:   Physical Exam BP 132/78 (BP Location: Right Arm, Patient Position: Sitting, Cuff Size: Normal)   Pulse (!) 58   Ht 5\' 7"  (1.702 m)   Wt 201 lb (91.2 kg)   LMP  (LMP Unknown)   BMI 31.48 kg/m     Skin warm and dry.Pelvic: external genitalia is normal in appearance no lesions, vagina: pinker and more moisture,urethra has no lesions or masses noted, cervix and uterus are absent, adnexa: no masses or tenderness noted. Bladder is non tender and no masses felt.   Upstream - 03/31/23 0919       Pregnancy Intention Screening   Does the patient want to become pregnant in the next year? N/A    Does the patient's partner want to become pregnant in the next year? N/A    Would the patient like to discuss contraceptive options today? N/A      Contraception Wrap Up   Current Method Female Sterilization   hyst   End Method Female Sterilization   hyst   Contraception Counseling Provided No            Pt gave verbal permission of exam without chaperone  Assessment:     1. Vaginal Dryness Feels much better  Continue Estrace vaginal cream 2-3 x weekly has refill  2. Itching in the vaginal area Has resolved   3. S/P hysterectomy    Plan:     Follow up prn

## 2023-04-20 ENCOUNTER — Ambulatory Visit (INDEPENDENT_AMBULATORY_CARE_PROVIDER_SITE_OTHER): Payer: Medicare Other | Admitting: Gastroenterology

## 2023-04-20 ENCOUNTER — Encounter: Payer: Self-pay | Admitting: Gastroenterology

## 2023-04-20 VITALS — BP 150/88 | HR 57 | Temp 97.9°F | Ht 67.0 in | Wt 202.8 lb

## 2023-04-20 DIAGNOSIS — Z8719 Personal history of other diseases of the digestive system: Secondary | ICD-10-CM | POA: Diagnosis not present

## 2023-04-20 DIAGNOSIS — R151 Fecal smearing: Secondary | ICD-10-CM | POA: Diagnosis not present

## 2023-04-20 DIAGNOSIS — K219 Gastro-esophageal reflux disease without esophagitis: Secondary | ICD-10-CM | POA: Diagnosis not present

## 2023-04-20 DIAGNOSIS — K5900 Constipation, unspecified: Secondary | ICD-10-CM | POA: Diagnosis not present

## 2023-04-20 NOTE — Patient Instructions (Addendum)
Continue pantoprazole 40 mg once daily for your acid reflux.  For the fecal leakage/constipation: Start Benefiber 1 tablespoon daily and at least 8 ounces of fluid of your choice. Start MiraLAX 17 g once daily, if you are having too many looser stools with this then you can reduce to every other day. Consider Kegel exercises or pelvic floor exercises.  We will follow-up in 8 weeks, sooner if needed.  It was a pleasure to see you today. I want to create trusting relationships with patients. If you receive a survey regarding your visit,  I greatly appreciate you taking time to fill this out on paper or through your MyChart. I value your feedback.  Brooke Bonito, MSN, FNP-BC, AGACNP-BC [facility]  Pelvic floor exercises: ?Bridge pose     The bridge pose targets the pelvic floor and engages the core and glute muscles. Follow these steps to perform the bridge pose:  Lie on your back with your knees bent and feet hip-width apart.??   Place your arms at your sides, palms facing down.??  Inhale and lift your hips towards the ceiling, engaging your pelvic floor muscles.??  Hold the pose for 10-15 seconds while maintaining steady breath.??  Slowly lower your hips back to the floor and repeat for 10-15 repetitions. woman doing bridge pose   Squats     Squats are excellent for working the muscles in the lower body, including the pelvic floor. Here's a guide to performing squats correctly: Stand with your feet shoulder-width apart.??  Lower your body as if sitting back into a chair, keeping your chest and back straight.??  Engage your pelvic floor muscles as you lower and rise.??  Aim for three sets of 10-15 squats, gradually increasing the intensity as your strength improves.?  Man doing squats   Pelvic tilts??      Pelvic tilts help strengthen your lower back and abdominal muscles, contributing to pelvic floor stability. Follow these steps:??  Lie on your  back with your knees bent and feet flat on the floor.??  Tighten your abdominal muscles and press your lower back into the floor.??  Hold for a few seconds, then release.??  Repeat this movement 10-15 times, gradually increasing as your muscles become stronger.??    Bird dog??    The bird-dog exercise activates multiple muscle groups, including the pelvic floor. Follow these steps to perform the exercise:?? Start on your hands and knees, aligning your wrists under your shoulders and your knees under your hips. Keep your back straight.??  Engage your core muscles and retract your shoulder blades down toward your hips.??  Simultaneously extend and raise your left leg and right arm, maintaining a neutral body position. Hold this position for a few seconds.??  Gently lower your arm and leg to the starting position. Repeat the movement with the opposite arm and leg.?  woman doing bird dog yoga pose   Incorporating these pelvic floor exercises into your regular fitness routine can significantly improve pelvic health. Consistency is key - aim to perform these exercises 2-3 times a week for optimal results. Strengthening your pelvic floor helps prevent common issues and promotes overall wellness and a higher quality of life.

## 2023-04-20 NOTE — Progress Notes (Signed)
GI Office Note    Referring Provider: Anabel Halon, MD Primary Care Physician:  Anabel Halon, MD Primary Gastroenterologist: Dolores Frame, MD  Date:  04/20/2023  ID:  Molly Knight, DOB 12/09/1955, MRN [id number]   Chief Complaint   Chief Complaint  Patient presents with   Gastroesophageal Reflux    Follow up on GERD. Reflux is better. Watching diet and taking med every day.    fecal leakage    Concerns about fecal leakage for past 3 weeks. Abnormal odor to stools. Sister passed with colon cancer at age 38.    History of Present Illness  Molly Knight is a 67 y.o. female with a history of GERD, hiatal hernia, thyroid disease presenting today with complaint of diarrhea.  Colonoscopy January 2021 at [facility] GI: -Descending colon polyp -Pathology revealed tubular adenoma -Repeat colonoscopy 2026  EGD June 2024: - No endoscopic esophageal abnormality to explain patient's dysphagia s/p dilation - 1 cm hiatal hernia. - Normal stomach. - Normal examined duodenum. - No specimens collected.  Last office visit 02/03/23.  Patient reported there was doing better with diet changes and avoiding eating late.  Dysphagia has also improved.  Reported more coughing after her EGD but was given Magic mouthwash and her symptoms were away.  Taking a PPI and H2 blocker as needed, will take when she has some symptoms.  Denies any nausea or vomiting.  Denies abdominal pain, constipation, or diarrhea, BRBPR, or melena.  Labs 03/18/2023: Normal TSH and T4.  Today: Reports her reflux has been better, she is watching her diet and taking her pantoprazole 40 mg once daily.  She has been concerned about fecal leakage for the past 3 weeks and an abnormal odor to her stools.  This concerns her given her sister passed with colon cancer at age 66.   Her normal is more firm stools and the harder and would go about every 2-3 days. She will take some probiotics or magnesium in  the past if she needed it. She took imodium and that did not change things. She used to have to strain some if it had been a while. Has been having soft small stools, will go very little at times and then every time she goes to the bathroom she will have a smear and can feel some pressure and some pasty stools. Has not tried anything over the counter other than the imodium. She knows she has hemorrhoids. She reports fiber gives her gas  - used to take metamucil but gave her worse gas. She will use wipes every now and then.   Has admitted to not following the best diet right now. Has been trying to get back into exercising. Has been feeling some pelvic or rectal fullness and pressure.  Wt Readings from Last 3 Encounters:  04/20/23 202 lb 12.8 oz (92 kg)  03/31/23 201 lb (91.2 kg)  03/26/23 203 lb 3.2 oz (92.2 kg)   Current Outpatient Medications  Medication Sig Dispense Refill   estradiol (ESTRACE) 0.1 MG/GM vaginal cream Use 0.5 gm in the vagina for 2 weeks then 2-3 x weekly 42.5 g 1   estradiol (VIVELLE-DOT) 0.05 MG/24HR patch Place 1 patch (0.05 mg total) onto the skin 2 (two) times a week. 24 patch 3   FLUoxetine (PROZAC) 20 MG capsule TAKE 1 CAPSULE(20 MG) BY MOUTH DAILY 30 capsule 3   levothyroxine (SYNTHROID) 75 MCG tablet Take 1 tablet (75 mcg total) by mouth daily before breakfast. 90 tablet 1  losartan (COZAAR) 50 MG tablet Take 1 tablet (50 mg total) by mouth daily. 90 tablet 1   Multiple Vitamins-Minerals (MULTI VITAMIN/MINERALS) TABS Take 1 tablet by mouth daily.     pantoprazole (PROTONIX) 40 MG tablet Take 1 tablet (40 mg total) by mouth daily. 90 tablet 1   rosuvastatin (CRESTOR) 10 MG tablet Take 10 mg by mouth daily.     No current facility-administered medications for this visit.    Past Medical History:  Diagnosis Date   Allergy    Seasonal   Depression    GERD (gastroesophageal reflux disease)    Heart murmur 1965   Hiatal hernia    Hypertension 2023   Thyroid  disease    Thyroid nodule 09/27/2015   Last Assessment & Plan:  Formatting of this note might be different from the original. Stable.  Her left lobe is normal in size on physical exam with no palpable nodule.    Past Surgical History:  Procedure Laterality Date   ABDOMINAL HYSTERECTOMY  2003   bladder tach     CESAREAN SECTION     COLONOSCOPY  04/2019   [place] - Dr. Roseanne Reno - repeat Jan 2025 per patient.   ESOPHAGOGASTRODUODENOSCOPY (EGD) WITH PROPOFOL N/A 10/09/2022   Procedure: ESOPHAGOGASTRODUODENOSCOPY (EGD) WITH PROPOFOL;  Surgeon: Dolores Frame, MD;  [facility];  Service: Gastroenterology;  Laterality: N/A;  12:30pm;asa 2   SAVORY DILATION  10/09/2022   Procedure: SAVORY DILATION;  Surgeon: Marguerita Merles, Reuel Boom, MD;  [facility]: [facility];  Service: Gastroenterology;;   SHOULDER SURGERY Bilateral 02/2019   THYROIDECTOMY, PARTIAL  10/2016   TUBAL LIGATION  1991    Family History  Problem Relation Age of Onset   Hypertension Mother    Aortic aneurysm Mother    Heart disease Mother    Renal Disease Father    Alcohol abuse Father    Asthma Father    Heart disease Father    Colon cancer Sister    Cancer Sister    Early death Sister    Heart attack Sister    Heart disease Sister    Other Sister        tahycardia   Throat cancer Brother    Cancer Brother    Other Brother        heart valve surgery   Heart disease Sister     Allergies as of 04/20/2023   (No Known Allergies)    Social History   Socioeconomic History   Marital status: Married    Spouse name: Not on file   Number of children: Not on file   Years of education: Not on file   Highest education level: Associate degree: academic program  Occupational History   Not on file  Tobacco Use   Smoking status: Never    Passive exposure: Never   Smokeless tobacco: Never  Vaping Use   Vaping status: Never Used  Substance and Sexual Activity   Alcohol use: Not  Currently   Drug use: Never   Sexual activity: Not Currently    Birth control/protection: Surgical    Comment: hyst  Other Topics Concern   Not on file  Social History Narrative   Not on file   Social Drivers of Health   Financial Resource Strain: Low Risk  (03/20/2023)   Overall Financial Resource Strain (CARDIA)    Difficulty of Paying Living Expenses: Not hard at all  Food Insecurity: No Food Insecurity (03/20/2023)   Hunger Vital Sign  Worried About Programme researcher, broadcasting/film/video in the Last Year: Never true    Ran Out of Food in the Last Year: Never true  Transportation Needs: Patient Declined (03/20/2023)   PRAPARE - Transportation    Lack of Transportation (Medical): Patient declined    Lack of Transportation (Non-Medical): Patient declined  Physical Activity: Inactive (03/20/2023)   Exercise Vital Sign    Days of Exercise per Week: 0 days    Minutes of Exercise per Session: 20 min  Stress: Patient Declined (03/20/2023)   [employer] of Occupational Health - Occupational Stress Questionnaire    Feeling of Stress : Patient declined  Social Connections: Unknown (03/20/2023)   Social Connection and Isolation Panel [NHANES]    Frequency of Communication with Friends and Family: Three times a week    Frequency of Social Gatherings with Friends and Family: Patient declined    Attends Religious Services: Patient declined    Active Member of Clubs or Organizations: Yes    Attends Banker Meetings: 1 to 4 times per year    Marital Status: Married  Recent Concern: Social Connections - Socially Isolated (12/24/2022)   Social Connection and Isolation Panel [NHANES]    Frequency of Communication with Friends and Family: Once a week    Frequency of Social Gatherings with Friends and Family: Once a week    Attends Religious Services: Never    Database administrator or Organizations: No    Attends Engineer, structural: Not on file    Marital Status: Married      Review of Systems   Gen: Denies fever, chills, anorexia. Denies fatigue, weakness, weight loss.  CV: Denies chest pain, palpitations, syncope, peripheral edema, and claudication. Resp: Denies dyspnea at rest, cough, wheezing, coughing up blood, and pleurisy. GI: See HPI Derm: Denies rash, itching, dry skin Psych: Denies depression, anxiety, memory loss, confusion. No homicidal or suicidal ideation.  Heme: Denies bruising, bleeding, and enlarged lymph nodes.  Physical Exam   BP (!) 150/88   Pulse (!) 57   Temp 97.9 F (36.6 C) (Oral)   Ht 5\' 7"  (1.702 m)   Wt 202 lb 12.8 oz (92 kg)   LMP  (LMP Unknown)   BMI 31.76 kg/m   General:   Alert and oriented. No distress noted. Pleasant and cooperative.  Head:  Normocephalic and atraumatic. Eyes:  Conjuctiva clear without scleral icterus. Mouth:  Oral mucosa pink and moist. Good dentition. No lesions.  Abdomen:  +BS, soft, non-tender and non-distended. No rebound or guarding. No HSM or masses noted. Rectal: deferred Msk:  Symmetrical without gross deformities. Normal posture. Extremities:  Without edema. Neurologic:  Alert and  oriented x4 Psych:  Alert and cooperative. Normal mood and affect.  Assessment  Stevanie Britts is a 67 y.o. female with a history of GERD, hiatal hernia, thyroid disease  presenting today with fecal soilage and constipation.   Constipation, fecal soiling: Since that her fecal soiling is secondary to constipation and withholding stool in the rectal vault due to incomplete emptying.  She has a history of known constipation as well as hemorrhoids.  Laxity of rectal mucosa tissue and hemorrhoids is likely holding onto some of the stool within the rectal vault which is causing frequent soiling and need for frequent hygiene.  She has not tolerated Metamucil in the past due to significant gassiness however she has not tried Benefiber therefore I recommended this today.  Advised her to take this along with  MiraLAX either  daily or every other day depending on her bowel habits.  She states her stools have been smaller and has had frequent flatulence which I suspect is from the constipation.  Diet could be also playing a role currently.  We will try this and if no improvement will likely plan for rectal exam and depending on rectal exam then we may proceed with early interval surveillance colonoscopy given her personal history of polyps and her family history of colon cancer.  We also discussed pelvic floor exercises today and provided her some separate written education.  GERD: Well-controlled with pantoprazole 40 mg once daily.  PLAN   Miralax 17 g daily or every other day Benefiber 1 Tablspoon daily.  Continue pantoprazole 40 mg once daily.  Pelvic floor exercises/Kegel exercises. Follow-up in 8 weeks  Brooke Bonito, MSN, FNP-BC, AGACNP-BC [facility]

## 2023-05-06 ENCOUNTER — Other Ambulatory Visit: Payer: Self-pay | Admitting: Internal Medicine

## 2023-05-06 ENCOUNTER — Encounter: Payer: Self-pay | Admitting: Internal Medicine

## 2023-05-06 DIAGNOSIS — E782 Mixed hyperlipidemia: Secondary | ICD-10-CM

## 2023-05-06 MED ORDER — ATORVASTATIN CALCIUM 20 MG PO TABS: 20.0000 mg | ORAL_TABLET | Freq: Every day | ORAL | 5 refills | Status: DC

## 2023-05-08 ENCOUNTER — Other Ambulatory Visit: Payer: Self-pay | Admitting: Internal Medicine

## 2023-05-08 DIAGNOSIS — F320 Major depressive disorder, single episode, mild: Secondary | ICD-10-CM

## 2023-05-20 ENCOUNTER — Encounter: Payer: Self-pay | Admitting: Orthopedic Surgery

## 2023-05-20 ENCOUNTER — Ambulatory Visit (INDEPENDENT_AMBULATORY_CARE_PROVIDER_SITE_OTHER): Payer: Medicare Other | Admitting: Orthopedic Surgery

## 2023-05-20 VITALS — BP 138/78 | HR 80 | Ht 67.0 in | Wt 202.0 lb

## 2023-05-20 DIAGNOSIS — S46001D Unspecified injury of muscle(s) and tendon(s) of the rotator cuff of right shoulder, subsequent encounter: Secondary | ICD-10-CM

## 2023-05-20 MED ORDER — METHYLPREDNISOLONE ACETATE 40 MG/ML IJ SUSP
40.0000 mg | Freq: Once | INTRAMUSCULAR | Status: AC
Start: 2023-05-20 — End: 2023-05-20
  Administered 2023-05-20: 40 mg via INTRA_ARTICULAR

## 2023-05-20 NOTE — Patient Instructions (Signed)

## 2023-05-20 NOTE — Progress Notes (Signed)
Return patient Visit  Assessment: Molly Knight is a 68 y.o. female with the following: Right rotator cuff tendinitis  Plan: Molly Knight has had recurrence of pain in her right shoulder.  Prior injection was very successful, with improved pain until just recently.  She has good motion and strength overall.  She is interested in another injection.  This was completed in clinic today without issues.  We briefly discussed proceeding with an MRI.  She would like to avoid additional surgery, and imaging at this time.  She will follow-up as needed.  Procedure note injection - Right shoulder, ultrasound guidance   Verbal consent was obtained to inject the Right shoulder, glenohumeral joint  Timeout was completed to confirm the site of injection.   Using the ultrasound, the rotator cuff tendons were identified.  The joint space was also identified. The skin was prepped with alcohol and ethyl chloride was sprayed at the injection site.  A 21-gauge needle was used to inject 40 mg of Depo-Medrol and 1% lidocaine (4 cc) into the glenohumeral joint space of the Right shoulder using a posterolateral approach.  The needle was visualized entering the glenohumeral joint, and the medication was also visualized. There were no complications.  A sterile bandage was applied.   Note: In order to accurately identify the placement of the needle, ultrasound was required, to increase the accuracy, and specificity of the injection.      Follow-up: Return if symptoms worsen or fail to improve.  Subjective:  Chief Complaint  Patient presents with   Shoulder Pain    Right/ [place] injection on 10/17/22 helped a lot has now worn off would like another one     History of Present Illness: Molly Knight is a 68 y.o. female who returns for evaluation of right shoulder pain.  I have seen her a couple of times for pain in the right shoulder.  She has done well with injections.  Most recent injection was  greater than 6 months ago.  She was doing very well until recently.  No specific injury.  She has pain over the anterior shoulder, radiating distally.   Review of Systems: No fevers or chills No numbness or tingling No chest pain No shortness of breath No bowel or bladder dysfunction No GI distress No headaches     Objective: BP 138/78   Pulse 80   Ht 5\' 7"  (1.702 m)   Wt 202 lb (91.6 kg)   LMP  (LMP Unknown)   BMI 31.64 kg/m   Physical Exam:  General: Alert and oriented. and No acute distress. Gait: Normal gait.  Shoulder with well-healed surgical incisions.  Active forward flexion 170 degrees.  Abduction to 90 degrees.  No tenderness to palpation over the anterior shoulder.  Negative belly press.  5/5 strength, with some discomfort in the right shoulder.  Fingers are warm and well-perfused.  IMAGING: No new imaging obtained today   New Medications:  No orders of the defined types were placed in this encounter.     Oliver Barre, MD  05/20/2023 10:34 AM

## 2023-05-20 NOTE — Addendum Note (Signed)
Addended byCaffie Damme on: 05/20/2023 11:27 AM   Modules accepted: Orders

## 2023-06-16 ENCOUNTER — Ambulatory Visit (INDEPENDENT_AMBULATORY_CARE_PROVIDER_SITE_OTHER): Payer: Medicare Other | Admitting: Gastroenterology

## 2023-06-16 ENCOUNTER — Encounter (INDEPENDENT_AMBULATORY_CARE_PROVIDER_SITE_OTHER): Payer: Self-pay | Admitting: Gastroenterology

## 2023-06-16 VITALS — BP 138/77 | HR 64 | Temp 97.5°F | Ht 67.5 in | Wt 203.1 lb

## 2023-06-16 DIAGNOSIS — K5900 Constipation, unspecified: Secondary | ICD-10-CM | POA: Insufficient documentation

## 2023-06-16 DIAGNOSIS — K219 Gastro-esophageal reflux disease without esophagitis: Secondary | ICD-10-CM | POA: Diagnosis not present

## 2023-06-16 NOTE — Progress Notes (Signed)
 Referring Provider: Anabel Halon, MD Primary Care Physician:  Anabel Halon, MD Primary GI Physician: Dr. Levon Hedger   Chief Complaint  Patient presents with   Follow-up    Patient here today for a follow up appointment for [facility]. Patient symptoms are much better on Pantoprazole 40 mg prn.   HPI:   Molly Knight is a 68 y.o. female with past medical history of GERD, hiatal hernia, thyroid disease   Patient presenting today for follow up of constipation/fecal soiling and GERD   Last seen December 2024 by Brooke Bonito, NP, at that time patient reports her reflux symptoms were better she is watching what she was eating taking pantoprazole 40 mg daily.  Patient concerned about fecal leakage for the past 3 weeks and abnormal odor to her stool.  Reported she is having softer smaller stools.  Taking some Imodium.  Used Metamucil but this gave her worsening gas.  Patient recommended start MiraLAX 17 g daily or every other day, Benefiber 1 tablespoon daily, continue Protonix 40 mg daily, pelvic floor exercises/Kegel exercises  Present: She notes she is feeling much better since her last OV. She is only using miralax/benefiber PRN. She is having more regular BMs. Taking magnesium PRN if she feels constipated which helps. She is having a BM everyday to every other day. She notes she has not been eating the best lately.   GERD is well managed, is not needing her PPI daily. She reports she only takes this PRN when she knows she is going to be eating a trigger food. She notes maybe taking her PPI 1-2 times per week.   No red flag symptoms. Patient denies melena, hematochezia, nausea, vomiting, diarrhea, constipation, dysphagia, odyonophagia, early satiety or weight loss.    Colonoscopy January 2021 at [facility] GI: -Descending colon polyp -Pathology revealed tubular adenoma  Repeat colonoscopy 2026   EGD June 2024: - No endoscopic esophageal abnormality to explain patient's  dysphagia s/p dilation - 1 cm hiatal hernia. - Normal stomach. - Normal examined duodenum. - No specimens collected.   Past Medical History:  Diagnosis Date   Allergy    Seasonal   Depression    GERD (gastroesophageal reflux disease)    Heart murmur 1965   Hiatal hernia    Hypertension 2023   Thyroid disease    Thyroid nodule 09/27/2015   Last Assessment & Plan:  Formatting of this note might be different from the original. Stable.  Her left lobe is normal in size on physical exam with no palpable nodule.    Past Surgical History:  Procedure Laterality Date   ABDOMINAL HYSTERECTOMY  2003   bladder tach     CESAREAN SECTION     COLONOSCOPY  04/2019   [place] - Dr. Roseanne Reno - repeat Jan 2025 per patient.   ESOPHAGOGASTRODUODENOSCOPY (EGD) WITH PROPOFOL N/A 10/09/2022   Procedure: ESOPHAGOGASTRODUODENOSCOPY (EGD) WITH PROPOFOL;  Surgeon: Dolores Frame, MD;  [facility];  Service: Gastroenterology;  Laterality: N/A;  12:30pm;asa 2   SAVORY DILATION  10/09/2022   Procedure: SAVORY DILATION;  Surgeon: Marguerita Merles, Reuel Boom, MD;  [facility]: [facility];  Service: Gastroenterology;;   SHOULDER SURGERY Bilateral 02/2019   THYROIDECTOMY, PARTIAL  10/2016   TUBAL LIGATION  1991    Current Outpatient Medications  Medication Sig Dispense Refill   atorvastatin (LIPITOR) 20 MG tablet Take 1 tablet (20 mg total) by mouth daily. 30 tablet 5   estradiol (ESTRACE) 0.1 MG/GM vaginal cream Use  0.5 gm in the vagina for 2 weeks then 2-3 x weekly 42.5 g 1   estradiol (VIVELLE-DOT) 0.05 MG/24HR patch Place 1 patch (0.05 mg total) onto the skin 2 (two) times a week. 24 patch 3   FLUoxetine (PROZAC) 20 MG capsule TAKE 1 CAPSULE(20 MG) BY MOUTH DAILY 30 capsule 3   levothyroxine (SYNTHROID) 75 MCG tablet Take 1 tablet (75 mcg total) by mouth daily before breakfast. 90 tablet 1   losartan (COZAAR) 50 MG tablet Take 1 tablet (50 mg total) by mouth daily. 90 tablet 1    Multiple Vitamins-Minerals (MULTI VITAMIN/MINERALS) TABS Take 1 tablet by mouth daily.     pantoprazole (PROTONIX) 40 MG tablet Take 1 tablet (40 mg total) by mouth daily. 90 tablet 1   No current facility-administered medications for this visit.    Allergies as of 06/16/2023   (No Known Allergies)    Family History  Problem Relation Age of Onset   Hypertension Mother    Aortic aneurysm Mother    Heart disease Mother    Renal Disease Father    Alcohol abuse Father    Asthma Father    Heart disease Father    Colon cancer Sister    Cancer Sister    Early death Sister    Heart attack Sister    Heart disease Sister    Other Sister        tahycardia   Throat cancer Brother    Cancer Brother    Other Brother        heart valve surgery   Heart disease Sister     Social History   Socioeconomic History   Marital status: Married    Spouse name: Not on file   Number of children: Not on file   Years of education: Not on file   Highest education level: Associate degree: academic program  Occupational History   Not on file  Tobacco Use   Smoking status: Never    Passive exposure: Never   Smokeless tobacco: Never  Vaping Use   Vaping status: Never Used  Substance and Sexual Activity   Alcohol use: Not Currently   Drug use: Never   Sexual activity: Not Currently    Birth control/protection: Surgical    Comment: hyst  Other Topics Concern   Not on file  Social History Narrative   Not on file   Social Drivers of Health   Financial Resource Strain: Low Risk  (03/20/2023)   Overall Financial Resource Strain (CARDIA)    Difficulty of Paying Living Expenses: Not hard at all  Food Insecurity: No Food Insecurity (03/20/2023)   Hunger Vital Sign    Worried About Running Out of Food in the Last Year: Never true    Ran Out of Food in the Last Year: Never true  Transportation Needs: Patient Declined (03/20/2023)   PRAPARE - Transportation    Lack of Transportation  (Medical): Patient declined    Lack of Transportation (Non-Medical): Patient declined  Physical Activity: Inactive (03/20/2023)   Exercise Vital Sign    Days of Exercise per Week: 0 days    Minutes of Exercise per Session: 20 min  Stress: Patient Declined (03/20/2023)   [employer] of Occupational Health - Occupational Stress Questionnaire    Feeling of Stress : Patient declined  Social Connections: Unknown (03/20/2023)   Social Connection and Isolation Panel [NHANES]    Frequency of Communication with Friends and Family: Three times a week    Frequency of  Social Gatherings with Friends and Family: Patient declined    Attends Religious Services: Patient declined    Database administrator or Organizations: Yes    Attends Engineer, structural: 1 to 4 times per year    Marital Status: Married  Recent Concern: Social Connections - Socially Isolated (12/24/2022)   Social Connection and Isolation Panel [NHANES]    Frequency of Communication with Friends and Family: Once a week    Frequency of Social Gatherings with Friends and Family: Once a week    Attends Religious Services: Never    Database administrator or Organizations: No    Attends Engineer, structural: Not on file    Marital Status: Married    Review of systems General: negative for malaise, night sweats, fever, chills, weight loss Neck: Negative for lumps, goiter, pain and significant neck swelling Resp: Negative for cough, wheezing, dyspnea at rest CV: Negative for chest pain, leg swelling, palpitations, orthopnea GI: denies melena, hematochezia, nausea, vomiting, diarrhea, dysphagia, odyonophagia, early satiety or unintentional weight loss. +occasional GERD symptoms +constipation  Neuro: negative for tremor, gait imbalance, syncope and seizures. The remainder of the review of systems is noncontributory.  Physical Exam: BP 138/77 (BP Location: Right Arm, Patient Position: Sitting, Cuff Size: Large)    Pulse 64   Temp (!) 97.5 F (36.4 C) (Temporal)   Ht 5' 7.5" (1.715 m)   Wt 203 lb 1.6 oz (92.1 kg)   LMP  (LMP Unknown)   BMI 31.34 kg/m  General:   Alert and oriented. No distress noted. Pleasant and cooperative.  Head:  Normocephalic and atraumatic. Eyes:  Conjuctiva clear without scleral icterus. Mouth:  Oral mucosa pink and moist. Good dentition. No lesions. Heart: Normal rate and rhythm, s1 and s2 heart sounds present.  Lungs: Clear lung sounds in all lobes. Respirations equal and unlabored. Abdomen:  +BS, soft, non-tender and non-distended. No rebound or guarding. No HSM or masses noted. Neurologic:  Alert and  oriented x4 Psych:  Alert and cooperative. Normal mood and affect.  Invalid input(s): "6 MONTHS"   ASSESSMENT: Chauntae Hults is a 68 y.o. female presenting today for follow up constipation/fecal soiling and GERD  Constipation/fecal soiling: Fecal soiling has resolved, patient is having very little issues with constipation at this time and usually having a bowel movement every day to every other day.  Using magnesium on the times where she feels constipated which works well for her.  Very rarely takes MiraLAX or Benefiber.  Denies rectal bleeding or melena.  Would recommend she continue with current regimen.  GERD: Patient reports GERD is mostly well-managed with diet, will have to take her Protonix 40 mg maybe 1-2 times per week usually in preparation of a certain meal she is eating.  Recent EGD without presence of Barrett's esophagus or other abnormalities, would recommend good reflux precautions, can continue use PPI as needed though she is requiring this more than 1-2 times per week should consider regularly scheduled dosing of PPI.  Last colonoscopy in January 2021 with presence of tubular adenoma, recommended to repeat in 5 years, placed on recall list to ensure we get her scheduled for January 2026.   PLAN:  -continue with magnesium PRN for  constipation -Increase water intake, aim for atleast 64 oz per day Increase fruits, veggies and whole grains, kiwi and prunes are especially good for constipation -good reflux precautions - PPI PRN, consider scheduled dosing if needing more than 1-2 times per week -repeat TCS  January 2026 (will ensure on recall list)   All questions were answered, patient verbalized understanding and is in agreement with plan as outlined above.    Follow Up: 1 year   Jenay Morici L. Jeanmarie Hubert, MSN, APRN, AGNP-C Adult-Gerontology Nurse Practitioner [facility]  I have reviewed the note and agree with the APP's assessment as described in this progress note  Katrinka Blazing, MD Gastroenterology and Hepatology [facility]

## 2023-06-16 NOTE — Patient Instructions (Signed)
 I'm glad you are doing well! You can continue with your current regimen of magnesium for constipation and your protonix 40mg  as needed, if you are requiring protonix more than 1-2 times per week, you may want to consider taking it regularly atleast every other day.  Be mindful of greasy, spicy, tomato/citrus based foods, caffeine, chocolate and alcohol, avoid eating late and stay upright 2-3 hours after eating prior to laying down  Follow up 1 year  It was a pleasure to see you today. I want to create trusting relationships with patients and provide genuine, compassionate, and quality care. I truly value your feedback! please be on the lookout for a survey regarding your visit with me today. I appreciate your input about our visit and your time in completing this!    Marykathryn Carboni L. Jeanmarie Hubert, MSN, APRN, AGNP-C Adult-Gerontology Nurse Practitioner [facility] at [facility]

## 2023-06-19 ENCOUNTER — Encounter: Payer: Self-pay | Admitting: Internal Medicine

## 2023-06-19 ENCOUNTER — Other Ambulatory Visit: Payer: Self-pay | Admitting: Medical Genetics

## 2023-06-20 ENCOUNTER — Other Ambulatory Visit: Payer: Self-pay | Admitting: Internal Medicine

## 2023-06-20 DIAGNOSIS — I10 Essential (primary) hypertension: Secondary | ICD-10-CM

## 2023-06-22 ENCOUNTER — Other Ambulatory Visit (HOSPITAL_COMMUNITY)
Admission: RE | Admit: 2023-06-22 | Discharge: 2023-06-22 | Disposition: A | Discharge status: Home / Self Care | Payer: Self-pay | Source: Ambulatory Visit | Attending: Medical Genetics | Admitting: Medical Genetics

## 2023-06-23 ENCOUNTER — Other Ambulatory Visit (HOSPITAL_COMMUNITY): Payer: Self-pay

## 2023-06-24 ENCOUNTER — Other Ambulatory Visit: Payer: Self-pay | Admitting: Internal Medicine

## 2023-06-24 DIAGNOSIS — E890 Postprocedural hypothyroidism: Secondary | ICD-10-CM

## 2023-07-10 LAB — GENECONNECT MOLECULAR SCREEN: Genetic Analysis Overall Interpretation: NEGATIVE

## 2023-07-14 LAB — CMP14+EGFR
ALT: 14 IU/L (ref 0–32)
AST: 18 IU/L (ref 0–40)
Albumin: 4.1 g/dL (ref 3.9–4.9)
Alkaline Phosphatase: 81 IU/L (ref 44–121)
BUN/Creatinine Ratio: 15 (ref 12–28)
BUN: 11 mg/dL (ref 8–27)
Bilirubin Total: 0.4 mg/dL (ref 0.0–1.2)
CO2: 23 mmol/L (ref 20–29)
Calcium: 9.1 mg/dL (ref 8.7–10.3)
Chloride: 103 mmol/L (ref 96–106)
Creatinine, Ser: 0.75 mg/dL (ref 0.57–1.00)
Globulin, Total: 1.9 g/dL (ref 1.5–4.5)
Glucose: 96 mg/dL (ref 70–99)
Potassium: 4.3 mmol/L (ref 3.5–5.2)
Sodium: 139 mmol/L (ref 134–144)
Total Protein: 6 g/dL (ref 6.0–8.5)
eGFR: 88 mL/min/{1.73_m2} (ref >59)

## 2023-07-14 LAB — LIPID PANEL
Chol/HDL Ratio: 2.2 ratio (ref 0.0–4.4)
Cholesterol, Total: 165 mg/dL (ref 100–199)
HDL: 74 mg/dL (ref >39)
LDL Chol Calc (NIH): 79 mg/dL (ref 0–99)
Triglycerides: 62 mg/dL (ref 0–149)
VLDL Cholesterol Cal: 12 mg/dL (ref 5–40)

## 2023-07-14 LAB — HEMOGLOBIN A1C
Est. average glucose Bld gHb Est-mCnc: 120 mg/dL
Hgb A1c MFr Bld: 5.8 % — ABNORMAL HIGH (ref 4.8–5.6)

## 2023-07-21 ENCOUNTER — Encounter: Payer: Self-pay | Admitting: Obstetrics & Gynecology

## 2023-07-21 ENCOUNTER — Ambulatory Visit: Admitting: Obstetrics & Gynecology

## 2023-07-21 VITALS — BP 145/80 | HR 56 | Ht 67.0 in | Wt 202.0 lb

## 2023-07-21 DIAGNOSIS — Z01419 Encounter for gynecological examination (general) (routine) without abnormal findings: Secondary | ICD-10-CM | POA: Diagnosis not present

## 2023-07-21 DIAGNOSIS — Z0000 Encounter for general adult medical examination without abnormal findings: Secondary | ICD-10-CM

## 2023-07-21 MED ORDER — ESTRADIOL 0.1 MG/GM VA CREA: TOPICAL_CREAM | VAGINAL | 3 refills | Status: DC

## 2023-07-21 MED ORDER — ESTRADIOL 0.05 MG/24HR TD PTTW: 1.0000 | MEDICATED_PATCH | TRANSDERMAL | 3 refills | Status: AC

## 2023-07-21 NOTE — Progress Notes (Signed)
 Subjective:     Molly Knight is a 68 y.o. female here for a routine exam.  No LMP recorded (lmp unknown). Patient has had a hysterectomy. [id number] Birth Control Method:  hysterectomy Menstrual Calendar(currently): amenorrhea  Current complaints: none.   Current acute medical issues:  none   Recent Gynecologic History No LMP recorded (lmp unknown). Patient has had a hysterectomy. Last Pap: na,   Last mammogram: 09/19/22,  normal  Past Medical History:  Diagnosis Date   Allergy    Seasonal   Depression    GERD (gastroesophageal reflux disease)    Heart murmur 1965   Hiatal hernia    Hypertension 2023   Thyroid disease    Thyroid nodule 09/27/2015   Last Assessment & Plan:  Formatting of this note might be different from the original. Stable.  Her left lobe is normal in size on physical exam with no palpable nodule.    Past Surgical History:  Procedure Laterality Date   ABDOMINAL HYSTERECTOMY  2003   bladder tach     CESAREAN SECTION     COLONOSCOPY  04/2019   [place] - Dr. Roseanne Reno - repeat Jan 2025 per patient.   ESOPHAGOGASTRODUODENOSCOPY (EGD) WITH PROPOFOL N/A 10/09/2022   Procedure: ESOPHAGOGASTRODUODENOSCOPY (EGD) WITH PROPOFOL;  Surgeon: Dolores Frame, MD;  [facility];  Service: Gastroenterology;  Laterality: N/A;  12:30pm;asa 2   SAVORY DILATION  10/09/2022   Procedure: SAVORY DILATION;  Surgeon: Marguerita Merles, Reuel Boom, MD;  Location: [facility];  Service: Gastroenterology;;   SHOULDER SURGERY Bilateral 02/2019   THYROIDECTOMY, PARTIAL  10/2016   TUBAL LIGATION  1991    OB History     Gravida  6   Para  3   Term  3   Preterm      AB  3   Living  3      SAB  3   IAB      Ectopic      Multiple      Live Births  3           Social History   Socioeconomic History   Marital status: Married    Spouse name: Not on file   Number of children: Not on file   Years of education: Not on file   Highest  education level: Associate degree: occupational, Scientist, product/process development, or vocational program  Occupational History   Not on file  Tobacco Use   Smoking status: Never    Passive exposure: Never   Smokeless tobacco: Never  Vaping Use   Vaping status: Never Used  Substance and Sexual Activity   Alcohol use: Not Currently   Drug use: Never   Sexual activity: Not Currently    Birth control/protection: Surgical    Comment: hyst  Other Topics Concern   Not on file  Social History Narrative   Not on file   Social Drivers of Health   Financial Resource Strain: Low Risk  (07/16/2023)   Overall Financial Resource Strain (CARDIA)    Difficulty of Paying Living Expenses: Not hard at all  Food Insecurity: No Food Insecurity (07/16/2023)   Hunger Vital Sign    Worried About Running Out of Food in the Last Year: Never true    Ran Out of Food in the Last Year: Never true  Transportation Needs: No Transportation Needs (07/16/2023)   PRAPARE - Administrator, Civil Service (Medical): No    Lack of Transportation (Non-Medical): No  Physical Activity:  Insufficiently Active (07/16/2023)   Exercise Vital Sign    Days of Exercise per Week: 3 days    Minutes of Exercise per Session: 20 min  Stress: No Stress Concern Present (07/16/2023)   [employer] of Occupational Health - Occupational Stress Questionnaire    Feeling of Stress : Not at all  Social Connections: Moderately Integrated (07/16/2023)   Social Connection and Isolation Panel [NHANES]    Frequency of Communication with Friends and Family: Once a week    Frequency of Social Gatherings with Friends and Family: Once a week    Attends Religious Services: 1 to 4 times per year    Active Member of [employer] or Organizations: No    Attends Engineer, structural: 1 to 4 times per year    Marital Status: Married    Family History  Problem Relation Age of Onset   Hypertension Mother    Aortic aneurysm Mother    Heart disease Mother     Renal Disease Father    Alcohol abuse Father    Asthma Father    Heart disease Father    Colon cancer Sister    Cancer Sister    Early death Sister    Heart attack Sister    Heart disease Sister    Other Sister        tahycardia   Throat cancer Brother    Cancer Brother    Other Brother        heart valve surgery   Heart disease Sister      Current Outpatient Medications:    atorvastatin (LIPITOR) 20 MG tablet, Take 1 tablet (20 mg total) by mouth daily., Disp: 30 tablet, Rfl: 5   FLUoxetine (PROZAC) 20 MG capsule, TAKE 1 CAPSULE(20 MG) BY MOUTH DAILY, Disp: 30 capsule, Rfl: 3   levothyroxine (SYNTHROID) 75 MCG tablet, TAKE 1 TABLET(75 MCG) BY MOUTH DAILY BEFORE BREAKFAST, Disp: 90 tablet, Rfl: 1   losartan (COZAAR) 50 MG tablet, TAKE 1 TABLET(50 MG) BY MOUTH DAILY, Disp: 90 tablet, Rfl: 1   Multiple Vitamins-Minerals (MULTI VITAMIN/MINERALS) TABS, Take 1 tablet by mouth daily., Disp: , Rfl:    pantoprazole (PROTONIX) 40 MG tablet, Take 1 tablet (40 mg total) by mouth daily., Disp: 90 tablet, Rfl: 1   estradiol (ESTRACE) 0.1 MG/GM vaginal cream, Use 0.5 gm in the vagina for 2 weeks then 2-3 x weekly, Disp: 42.5 g, Rfl: 3   [START ON 07/23/2023] estradiol (VIVELLE-DOT) 0.05 MG/24HR patch, Place 1 patch (0.05 mg total) onto the skin 2 (two) times a week., Disp: 24 patch, Rfl: 3  Review of Systems  Review of Systems  Constitutional: Negative for fever, chills, weight loss, malaise/fatigue and diaphoresis.  HENT: Negative for hearing loss, ear pain, nosebleeds, congestion, sore throat, neck pain, tinnitus and ear discharge.   Eyes: Negative for blurred vision, double vision, photophobia, pain, discharge and redness.  Respiratory: Negative for cough, hemoptysis, sputum production, shortness of breath, wheezing and stridor.   Cardiovascular: Negative for chest pain, palpitations, orthopnea, claudication, leg swelling and PND.  Gastrointestinal: negative for abdominal pain. Negative  for heartburn, nausea, vomiting, diarrhea, constipation, blood in stool and melena.  Genitourinary: Negative for dysuria, urgency, frequency, hematuria and flank pain.  Musculoskeletal: Negative for myalgias, back pain, joint pain and falls.  Skin: Negative for itching and rash.  Neurological: Negative for dizziness, tingling, tremors, sensory change, speech change, focal weakness, seizures, loss of consciousness, weakness and headaches.  Endo/Heme/Allergies: Negative for environmental allergies and polydipsia. Does  not bruise/bleed easily.  Psychiatric/Behavioral: Negative for depression, suicidal ideas, hallucinations, memory loss and substance abuse. The patient is not nervous/anxious and does not have insomnia.        Objective:  Blood pressure (!) 145/80, pulse (!) 56, height 5\' 7"  (1.702 m), weight 202 lb (91.6 kg).   Physical Exam  Vitals reviewed. Constitutional: She is oriented to person, place, and time. She appears well-developed and well-nourished.  HENT:  Head: Normocephalic and atraumatic.        Right Ear: External ear normal.  Left Ear: External ear normal.  Nose: Nose normal.  Mouth/Throat: Oropharynx is clear and moist.  Eyes: Conjunctivae and EOM are normal. Pupils are equal, round, and reactive to light. Right eye exhibits no discharge. Left eye exhibits no discharge. No scleral icterus.  Neck: Normal range of motion. Neck supple. No tracheal deviation present. No thyromegaly present.  Cardiovascular: Normal rate, regular rhythm, normal heart sounds and intact distal pulses.  Exam reveals no gallop and no friction rub.   No murmur heard. Respiratory: Effort normal and breath sounds normal. No respiratory distress. She has no wheezes. She has no rales. She exhibits no tenderness.  GI: Soft. Bowel sounds are normal. She exhibits no distension and no mass. There is no tenderness. There is no rebound and no guarding.  Genitourinary:  Breasts no masses skin changes or  nipple changes bilaterally      Vulva is normal without lesions Vagina is pink moist without discharge Cervix normal in appearance and pap is done Uterus is normal size shape and contour Adnexa is negative with normal sized ovaries   Musculoskeletal: Normal range of motion. She exhibits no edema and no tenderness.  Neurological: She is alert and oriented to person, place, and time. She has normal reflexes. She displays normal reflexes. No cranial nerve deficit. She exhibits normal muscle tone. Coordination normal.  Skin: Skin is warm and dry. No rash noted. No erythema. No pallor.  Psychiatric: She has a normal mood and affect. Her behavior is normal. Judgment and thought content normal.       Medications Ordered at today's visit: Meds ordered this encounter  Medications   estradiol (ESTRACE) 0.1 MG/GM vaginal cream    Sig: Use 0.5 gm in the vagina for 2 weeks then 2-3 x weekly    Dispense:  42.5 g    Refill:  3   estradiol (VIVELLE-DOT) 0.05 MG/24HR patch    Sig: Place 1 patch (0.05 mg total) onto the skin 2 (two) times a week.    Dispense:  24 patch    Refill:  3    Other orders placed at today's visit: No orders of the defined types were placed in this encounter.    ASSESSMENT + PLAN:    ICD-10-CM   1. Well woman exam with routine gynecological exam  Z01.419           Return in about 2 years (around 07/20/2025) for gyn follow up.

## 2023-07-22 ENCOUNTER — Ambulatory Visit: Payer: Medicare Other | Admitting: Internal Medicine

## 2023-07-28 ENCOUNTER — Other Ambulatory Visit: Payer: Self-pay | Admitting: Obstetrics & Gynecology

## 2023-07-31 ENCOUNTER — Other Ambulatory Visit: Payer: Self-pay | Admitting: Obstetrics & Gynecology

## 2023-08-04 ENCOUNTER — Ambulatory Visit (INDEPENDENT_AMBULATORY_CARE_PROVIDER_SITE_OTHER): Payer: Federal, State, Local not specified - PPO | Admitting: Gastroenterology

## 2023-08-11 ENCOUNTER — Encounter: Payer: Self-pay | Admitting: Internal Medicine

## 2023-08-11 ENCOUNTER — Ambulatory Visit (INDEPENDENT_AMBULATORY_CARE_PROVIDER_SITE_OTHER): Admitting: Internal Medicine

## 2023-08-11 VITALS — BP 130/80 | HR 75 | Ht 67.0 in | Wt 200.8 lb

## 2023-08-11 DIAGNOSIS — J4520 Mild intermittent asthma, uncomplicated: Secondary | ICD-10-CM

## 2023-08-11 DIAGNOSIS — E782 Mixed hyperlipidemia: Secondary | ICD-10-CM

## 2023-08-11 DIAGNOSIS — I10 Essential (primary) hypertension: Secondary | ICD-10-CM | POA: Diagnosis not present

## 2023-08-11 DIAGNOSIS — E890 Postprocedural hypothyroidism: Secondary | ICD-10-CM | POA: Diagnosis not present

## 2023-08-11 DIAGNOSIS — R7303 Prediabetes: Secondary | ICD-10-CM | POA: Diagnosis not present

## 2023-08-11 DIAGNOSIS — J45909 Unspecified asthma, uncomplicated: Secondary | ICD-10-CM

## 2023-08-11 DIAGNOSIS — J309 Allergic rhinitis, unspecified: Secondary | ICD-10-CM

## 2023-08-11 DIAGNOSIS — R232 Flushing: Secondary | ICD-10-CM

## 2023-08-11 HISTORY — DX: Unspecified asthma, uncomplicated: J45.909

## 2023-08-11 NOTE — Progress Notes (Signed)
 Established Patient Office Visit  Subjective:  Patient ID: Molly Knight, female    DOB: [birth year]  Age: 68 y.o. MRN: [id number]  CC:  Chief Complaint  Patient presents with   Medical Management of Chronic Issues    4 month f/u    HPI Molly Knight is a 68 y.o. female with past medical history of postoperative hypothyroidism, hot flashes and depression who presents for f/u of her chronic medical conditions.  BP is wnl today.  She takes losartan  50 mg QD regularly.  Patient denies dizziness, chest pain, dyspnea or palpitations. She was referred to cardiology from ER and had cardiac stress test, which was unremarkable.  She has not had any episode of chest pain or dyspnea since then.  She takes atorvastatin  20 QD for HLD.  Hypothyroidism: She takes levothyroxine  75 mcg QD.  She denies chronic fatigue, recent change in weight or appetite, tremors or palpitations.  She has been taking Prozac  and uses estradiol  patch for hot flashes. She has mild hot flashes, but is manageable for now.  She reports dry cough, sinus pressure and postnasal drip for the last 2 weeks.  Denies any fever, chills.  She has exertional dyspnea, for which she uses albuterol  inhaler with adequate relief.  She has tried using it at nighttime as well for wheezing.  She did not have any wheezing on physical exam today.  She has history of perforated tympanic membrane, which required surgery.  Her ear drainage has resolved now, but still has mild left sided ear discomfort.  Denies any fever or chills.  She is planned to see ENT specialist for tympanoplasty with graft.   Past Medical History:  Diagnosis Date   Allergy    Seasonal   Depression    GERD (gastroesophageal reflux disease)    Heart murmur 1965   Hiatal hernia    Hypertension 2023   Reactive airway disease 08/11/2023   Thyroid  disease    Thyroid  nodule 09/27/2015   Last Assessment & Plan:  Formatting of this note might be different from the  original. Stable.  Her left lobe is normal in size on physical exam with no palpable nodule.    Past Surgical History:  Procedure Laterality Date   ABDOMINAL HYSTERECTOMY  2003   bladder tach     CESAREAN SECTION     COLONOSCOPY  04/2019   [place]  - Dr. Bernida Brink - repeat Jan 2025 per patient.   ESOPHAGOGASTRODUODENOSCOPY (EGD) WITH PROPOFOL  N/A 10/09/2022   Procedure: ESOPHAGOGASTRODUODENOSCOPY (EGD) WITH PROPOFOL ;  Surgeon: Urban Garden, MD;  [facility];  Service: Gastroenterology;  Laterality: N/A;  12:30pm;asa 2   SAVORY DILATION  10/09/2022   Procedure: SAVORY DILATION;  Surgeon: Umberto Ganong, Bearl Limes, MD;  [facility];  Service: Gastroenterology;;   SHOULDER SURGERY Bilateral 02/2019   THYROIDECTOMY, PARTIAL  10/2016   TUBAL LIGATION  1991    Family History  Problem Relation Age of Onset   Hypertension Mother    Aortic aneurysm Mother    Heart disease Mother    Renal Disease Father    Alcohol abuse Father    Asthma Father    Heart disease Father    Colon cancer Sister    Cancer Sister    Early death Sister    Heart attack Sister    Heart disease Sister    Other Sister        tahycardia   Throat cancer Brother    Cancer Brother  Other Brother        heart valve surgery   Heart disease Sister     Social History   Socioeconomic History   Marital status: Married    Spouse name: Not on file   Number of children: Not on file   Years of education: Not on file   Highest education level: Associate degree: occupational, Scientist, product/process development, or vocational program  Occupational History   Not on file  Tobacco Use   Smoking status: Never    Passive exposure: Never   Smokeless tobacco: Never  Vaping Use   Vaping status: Never Used  Substance and Sexual Activity   Alcohol use: Not Currently   Drug use: Never   Sexual activity: Not Currently    Birth control/protection: Surgical    Comment: hyst  Other Topics Concern   Not  on file  Social History Narrative   Not on file   Social Drivers of Health   Financial Resource Strain: Low Risk  (07/16/2023)   Overall Financial Resource Strain (CARDIA)    Difficulty of Paying Living Expenses: Not hard at all  Food Insecurity: No Food Insecurity (07/16/2023)   Hunger Vital Sign    Worried About Running Out of Food in the Last Year: Never true    Ran Out of Food in the Last Year: Never true  Transportation Needs: No Transportation Needs (07/16/2023)   PRAPARE - Administrator, Civil Service (Medical): No    Lack of Transportation (Non-Medical): No  Physical Activity: Insufficiently Active (07/16/2023)   Exercise Vital Sign    Days of Exercise per Week: 3 days    Minutes of Exercise per Session: 20 min  Stress: No Stress Concern Present (07/16/2023)   [employer] of Occupational Health - Occupational Stress Questionnaire    Feeling of Stress : Not at all  Social Connections: Moderately Integrated (07/16/2023)   Social Connection and Isolation Panel [NHANES]    Frequency of Communication with Friends and Family: Once a week    Frequency of Social Gatherings with Friends and Family: Once a week    Attends Religious Services: 1 to 4 times per year    Active Member of [employer] or Organizations: No    Attends Engineer, structural: 1 to 4 times per year    Marital Status: Married  Catering manager Violence: Not At Risk (12/31/2022)   Humiliation, Afraid, Rape, and Kick questionnaire    Fear of Current or Ex-Partner: No    Emotionally Abused: No    Physically Abused: No    Sexually Abused: No    Outpatient Medications Prior to Visit  Medication Sig Dispense Refill   albuterol  (VENTOLIN  HFA) 108 (90 Base) MCG/ACT inhaler Inhale 1-2 puffs into the lungs every 6 (six) hours as needed for wheezing or shortness of breath.     atorvastatin  (LIPITOR) 20 MG tablet Take 1 tablet (20 mg total) by mouth daily. (Patient taking differently: Take 20 mg by mouth  every other day.) 30 tablet 5   estradiol  (ESTRACE ) 0.1 MG/GM vaginal cream USE 0.5 GM IN THE VAGINA FOR 2 WEEKS THEN 2-3 X WEEKLY 42.5 g 3   estradiol  (VIVELLE -DOT) 0.05 MG/24HR patch Place 1 patch (0.05 mg total) onto the skin 2 (two) times a week. 24 patch 3   FLUoxetine  (PROZAC ) 20 MG capsule TAKE 1 CAPSULE(20 MG) BY MOUTH DAILY 30 capsule 3   levothyroxine  (SYNTHROID ) 75 MCG tablet TAKE 1 TABLET(75 MCG) BY MOUTH DAILY BEFORE BREAKFAST 90 tablet  1   losartan  (COZAAR ) 50 MG tablet TAKE 1 TABLET(50 MG) BY MOUTH DAILY 90 tablet 1   Multiple Vitamins-Minerals (MULTI VITAMIN/MINERALS) TABS Take 1 tablet by mouth daily.     pantoprazole  (PROTONIX ) 40 MG tablet Take 1 tablet (40 mg total) by mouth daily. 90 tablet 1   No facility-administered medications prior to visit.    No Known Allergies  ROS Review of Systems  Constitutional:  Negative for chills and fever.  HENT:  Positive for congestion and sinus pressure. Negative for ear discharge and sore throat.   Eyes:  Negative for pain and discharge.  Respiratory:  Positive for cough, shortness of breath (Intermittent) and wheezing.   Cardiovascular:  Negative for chest pain and palpitations.  Gastrointestinal:  Negative for abdominal pain, diarrhea, nausea and vomiting.  Endocrine: Negative for polydipsia and polyuria.  Genitourinary:  Negative for dysuria and hematuria.  Musculoskeletal:  Positive for arthralgias and back pain. Negative for neck pain and neck stiffness.  Skin:  Negative for rash.  Neurological:  Negative for dizziness and weakness.  Psychiatric/Behavioral:  Negative for agitation and behavioral problems.       Objective:    Physical Exam Constitutional:      General: She is not in acute distress.    Appearance: She is not diaphoretic.  HENT:     Head: Normocephalic and atraumatic.     Nose: Congestion present.     Mouth/Throat:     Mouth: Mucous membranes are moist.     Pharynx: Posterior oropharyngeal erythema  present.  Eyes:     Extraocular Movements: Extraocular movements intact.  Cardiovascular:     Rate and Rhythm: Normal rate and regular rhythm.     Heart sounds: Normal heart sounds. No murmur heard. Pulmonary:     Breath sounds: Normal breath sounds. No wheezing or rales.  Musculoskeletal:     Lumbar back: Tenderness present. Decreased range of motion. Positive left straight leg raise test.     Right lower leg: No edema.     Left lower leg: No edema.  Skin:    General: Skin is warm.     Findings: No rash.  Neurological:     General: No focal deficit present.     Mental Status: She is alert and oriented to person, place, and time.  Psychiatric:        Mood and Affect: Mood normal.        Behavior: Behavior normal.     BP 130/80   Pulse 75   Ht 5\' 7"  (1.702 m)   Wt 200 lb 12.8 oz (91.1 kg)   LMP  (LMP Unknown)   SpO2 95%   BMI 31.45 kg/m  Wt Readings from Last 3 Encounters:  08/11/23 200 lb 12.8 oz (91.1 kg)  07/21/23 202 lb (91.6 kg)  06/16/23 203 lb 1.6 oz (92.1 kg)    Lab Results  Component Value Date   TSH 1.710 03/18/2023   Lab Results  Component Value Date   WBC 6.7 11/14/2022   HGB 13.1 11/14/2022   HCT 41.3 11/14/2022   MCV 93 11/14/2022   PLT 328 11/14/2022   Lab Results  Component Value Date   NA 139 07/13/2023   K 4.3 07/13/2023   CO2 23 07/13/2023   GLUCOSE 96 07/13/2023   BUN 11 07/13/2023   CREATININE 0.75 07/13/2023   BILITOT 0.4 07/13/2023   ALKPHOS 81 07/13/2023   AST 18 07/13/2023   ALT 14 07/13/2023   PROT 6.0  07/13/2023   ALBUMIN 4.1 07/13/2023   CALCIUM  9.1 07/13/2023   ANIONGAP 7 11/09/2022   EGFR 88 07/13/2023   Lab Results  Component Value Date   CHOL 165 07/13/2023   Lab Results  Component Value Date   HDL 74 07/13/2023   Lab Results  Component Value Date   LDLCALC 79 07/13/2023   Lab Results  Component Value Date   TRIG 62 07/13/2023   Lab Results  Component Value Date   CHOLHDL 2.2 07/13/2023   Lab  Results  Component Value Date   HGBA1C 5.8 (H) 07/13/2023      Assessment & Plan:   Problem List Items Addressed This Visit       Cardiovascular and Mediastinum   Essential hypertension - Primary (Chronic)   BP Readings from Last 1 Encounters:  08/11/23 130/80   Well-controlled with losartan  50 mg QD Advised DASH diet and moderate exercise/walking, at least 150 mins/week Advised to check BP at home and contact if BP > 140/90 persistently      Hot flashes   On Prozac  Has Estradiol  patch        Respiratory   Reactive airway disease   Recent worsening of cough and dyspnea, likely due to pollen exposure Has allergic rhinitis as well Continue Zyrtec  and Flonase for allergic symptoms Albuterol  as needed for dyspnea or wheezing She was prescribed Symbicort  from urgent care in the past, but due to her intermittent symptoms, advised to use albuterol  instead for now      Allergic sinusitis   Sinus pressure and postnasal drip likely due to allergic sinusitis Zyrtec  or Xyzal as needed for allergic symptoms Continue Flonase Advised to use vaporizer for nasal congestion        Endocrine   Postoperative hypothyroidism   Due to h/o multinodular goiter Lab Results  Component Value Date   TSH 1.710 03/18/2023   Takes Levothyroxine  75 mcg QD        Other   Mixed hyperlipidemia (Chronic)   Lipid profile reviewed, LDL improved now On atorvastatin  20 mg QD, she has back pain and fatigue with it - has tried Crestor , had similar symptoms Advised to take atorvastatin  20 mg every other day for now      Relevant Orders   Lipid Profile   Prediabetes   Lab Results  Component Value Date   HGBA1C 5.8 (H) 07/13/2023   Advised to follow low-carb diet      Relevant Orders   CMP14+EGFR   Hemoglobin A1c      No orders of the defined types were placed in this encounter.   Follow-up: Return in about 6 months (around 02/10/2024) for HTN.    Meldon Sport, MD

## 2023-08-11 NOTE — Patient Instructions (Addendum)
 Please start taking Atorvastatin  every other day.  Please continue to take medications as prescribed.  Please continue to follow low salt diet and perform moderate exercise/walking at least 150 mins/week.

## 2023-08-11 NOTE — Assessment & Plan Note (Signed)
 BP Readings from Last 1 Encounters:  08/11/23 130/80   Well-controlled with losartan  50 mg QD Advised DASH diet and moderate exercise/walking, at least 150 mins/week Advised to check BP at home and contact if BP > 140/90 persistently

## 2023-08-11 NOTE — Assessment & Plan Note (Signed)
 Lab Results  Component Value Date   HGBA1C 5.8 (H) 07/13/2023   Advised to follow low-carb diet

## 2023-08-11 NOTE — Assessment & Plan Note (Signed)
On Prozac Has Estradiol patch

## 2023-08-11 NOTE — Assessment & Plan Note (Signed)
 Sinus pressure and postnasal drip likely due to allergic sinusitis Zyrtec  or Xyzal as needed for allergic symptoms Continue Flonase Advised to use vaporizer for nasal congestion

## 2023-08-11 NOTE — Assessment & Plan Note (Signed)
 Recent worsening of cough and dyspnea, likely due to pollen exposure Has allergic rhinitis as well Continue Zyrtec  and Flonase for allergic symptoms Albuterol  as needed for dyspnea or wheezing She was prescribed Symbicort  from urgent care in the past, but due to her intermittent symptoms, advised to use albuterol  instead for now

## 2023-08-11 NOTE — Assessment & Plan Note (Addendum)
 Lipid profile reviewed, LDL improved now On atorvastatin  20 mg QD, she has back pain and fatigue with it - has tried Crestor , had similar symptoms Advised to take atorvastatin  20 mg every other day for now

## 2023-08-11 NOTE — Assessment & Plan Note (Signed)
 Due to h/o multinodular goiter Lab Results  Component Value Date   TSH 1.710 03/18/2023   Takes Levothyroxine 75 mcg QD

## 2023-08-24 ENCOUNTER — Encounter: Payer: Self-pay | Admitting: Internal Medicine

## 2023-09-01 ENCOUNTER — Encounter: Payer: Self-pay | Admitting: Internal Medicine

## 2023-09-09 ENCOUNTER — Ambulatory Visit: Admitting: Internal Medicine

## 2023-09-17 ENCOUNTER — Encounter: Payer: Self-pay | Admitting: Internal Medicine

## 2023-09-17 ENCOUNTER — Telehealth (INDEPENDENT_AMBULATORY_CARE_PROVIDER_SITE_OTHER): Admitting: Internal Medicine

## 2023-09-17 VITALS — Ht 67.0 in | Wt 199.0 lb

## 2023-09-17 DIAGNOSIS — F411 Generalized anxiety disorder: Secondary | ICD-10-CM | POA: Diagnosis not present

## 2023-09-17 DIAGNOSIS — F3341 Major depressive disorder, recurrent, in partial remission: Secondary | ICD-10-CM

## 2023-09-17 MED ORDER — CITALOPRAM HYDROBROMIDE 20 MG PO TABS: 20.0000 mg | ORAL_TABLET | Freq: Every day | ORAL | 3 refills | Status: DC

## 2023-09-17 NOTE — Assessment & Plan Note (Signed)
 Has been well-controlled overall, but states that she has been stressed due to her husband's health, feels more down or depressed on cloudy days On Prozac , was better since moving to a new home - has acid reflux due to capsule form Due to recent worsening of depression and anxiety, changed to Celexa 20 mg once daily - advised to stop Prozac  and start Celexa after 3 days

## 2023-09-17 NOTE — Assessment & Plan Note (Signed)
 Uncontrolled due to her husband's health conditions Started Celexa 20 mg QD, instead of Prozac  Advised to perform simple relaxation techniques

## 2023-09-17 NOTE — Patient Instructions (Signed)
 Please stop taking Prozac .  Please start taking citalopram after 3 days.

## 2023-09-17 NOTE — Progress Notes (Signed)
 Virtual Visit via Video Note   Because of Mende Portlock's co-morbid illnesses, she is at least at moderate risk for complications without adequate follow up.  This format is felt to be most appropriate for this patient at this time.  All issues noted in this document were discussed and addressed.  A limited physical exam was performed with this format.      Evaluation Performed:  Follow-up visit  Date:  09/17/2023   ID:  Molly Knight, DOB Aug 25, 1955, MRN [id number]  Patient Location: Home Provider Location: Office/Clinic  Participants: Patient Location of Patient: Home Location of Provider: Telehealth Consent was obtain for visit to be over via telehealth. I verified that I am speaking with the correct person using two identifiers.  PCP:  Meldon Sport, MD   Chief Complaint: MDD and GAD follow up  History of Present Illness:    Molly Knight is a 68 y.o. female with PMH of postoperative hypothyroidism, hot flashes and depression who has a video visit for follow-up of MDD and GAD.  She has been stressed recently about her husband's health.  She has apathy, decreased concentration, insomnia and anxiety spells.  She has been struggling to lose weight as well.  She currently takes Prozac  20 mg QD, but has acid reflux due to capsule form.  Denies any SI or HI currently.  The patient does not have symptoms concerning for COVID-19 infection (fever, chills, cough, or new shortness of breath).   Past Medical, Surgical, Social History, Allergies, and Medications have been Reviewed.  Past Medical History:  Diagnosis Date   Allergy    Seasonal   Depression    GERD (gastroesophageal reflux disease)    Heart murmur 1965   Hiatal hernia    Hypertension 2023   Reactive airway disease 08/11/2023   Thyroid  disease    Thyroid  nodule 09/27/2015   Last Assessment & Plan:  Formatting of this note might be different from the original. Stable.  Her left lobe is normal in  size on physical exam with no palpable nodule.   Past Surgical History:  Procedure Laterality Date   ABDOMINAL HYSTERECTOMY  2003   bladder tach     CESAREAN SECTION     COLONOSCOPY  04/2019   [place]  - Dr. Bernida Brink - repeat Jan 2025 per patient.   ESOPHAGOGASTRODUODENOSCOPY (EGD) WITH PROPOFOL  N/A 10/09/2022   Procedure: ESOPHAGOGASTRODUODENOSCOPY (EGD) WITH PROPOFOL ;  Surgeon: Urban Garden, MD;  [facility];  Service: Gastroenterology;  Laterality: N/A;  12:30pm;asa 2   SAVORY DILATION  10/09/2022   Procedure: SAVORY DILATION;  Surgeon: Umberto Ganong, Bearl Limes, MD;  Location: [facility];  Service: Gastroenterology;;   SHOULDER SURGERY Bilateral 02/2019   THYROIDECTOMY, PARTIAL  10/2016   TUBAL LIGATION  1991     Current Meds  Medication Sig   albuterol  (VENTOLIN  HFA) 108 (90 Base) MCG/ACT inhaler Inhale 1-2 puffs into the lungs every 6 (six) hours as needed for wheezing or shortness of breath.   atorvastatin  (LIPITOR) 20 MG tablet Take 1 tablet (20 mg total) by mouth daily. (Patient taking differently: Take 20 mg by mouth every other day.)   estradiol  (ESTRACE ) 0.1 MG/GM vaginal cream USE 0.5 GM IN THE VAGINA FOR 2 WEEKS THEN 2-3 X WEEKLY   estradiol  (VIVELLE -DOT) 0.05 MG/24HR patch Place 1 patch (0.05 mg total) onto the skin 2 (two) times a week.   FLUoxetine  (PROZAC ) 20 MG capsule TAKE 1 CAPSULE(20 MG) BY MOUTH DAILY  levothyroxine  (SYNTHROID ) 75 MCG tablet TAKE 1 TABLET(75 MCG) BY MOUTH DAILY BEFORE BREAKFAST   losartan  (COZAAR ) 50 MG tablet TAKE 1 TABLET(50 MG) BY MOUTH DAILY   Multiple Vitamins-Minerals (MULTI VITAMIN/MINERALS) TABS Take 1 tablet by mouth daily.   pantoprazole  (PROTONIX ) 40 MG tablet Take 1 tablet (40 mg total) by mouth daily.     Allergies:   Patient has no known allergies.   ROS:   Please see the history of present illness. All other systems reviewed and are negative.   Labs/Other Tests and Data Reviewed:     Recent Labs: 11/09/2022: Magnesium 1.9 11/14/2022: Hemoglobin 13.1; Platelets 328 03/18/2023: TSH 1.710 07/13/2023: ALT 14; BUN 11; Creatinine, Ser 0.75; Potassium 4.3; Sodium 139   Recent Lipid Panel Lab Results  Component Value Date/Time   CHOL 165 07/13/2023 08:13 AM   TRIG 62 07/13/2023 08:13 AM   HDL 74 07/13/2023 08:13 AM   CHOLHDL 2.2 07/13/2023 08:13 AM   LDLCALC 79 07/13/2023 08:13 AM    Wt Readings from Last 3 Encounters:  09/17/23 199 lb (90.3 kg)  08/11/23 200 lb 12.8 oz (91.1 kg)  07/21/23 202 lb (91.6 kg)     Objective:    Vital Signs:  Ht 5\' 7"  (1.702 m)   Wt 199 lb (90.3 kg)   LMP  (LMP Unknown)   BMI 31.17 kg/m    VITAL SIGNS:  reviewed GEN:  no acute distress EYES:  sclerae anicteric, EOMI - Extraocular Movements Intact RESPIRATORY:  normal respiratory effort, symmetric expansion NEURO:  alert and oriented x 3, no obvious focal deficit PSYCH:  normal affect  ASSESSMENT & PLAN:    MDD (major depressive disorder), recurrent, in partial remission (HCC) Has been well-controlled overall, but states that she has been stressed due to her husband's health, feels more down or depressed on cloudy days On Prozac , was better since moving to a new home - has acid reflux due to capsule form Due to recent worsening of depression and anxiety, changed to Celexa 20 mg once daily - advised to stop Prozac  and start Celexa after 3 days  GAD (generalized anxiety disorder) Uncontrolled due to her husband's health conditions Started Celexa 20 mg QD, instead of Prozac  Advised to perform simple relaxation techniques   I discussed the assessment and treatment plan with the patient. The patient was provided an opportunity to ask questions, and all were answered. The patient agreed with the plan and demonstrated an understanding of the instructions.   The patient was advised to call back or seek an in-person evaluation if the symptoms worsen or if the condition fails to  improve as anticipated.  The above assessment and management plan was discussed with the patient. The patient verbalized understanding of and has agreed to the management plan.   Medication Adjustments/Labs and Tests Ordered: Current medicines are reviewed at length with the patient today.  Concerns regarding medicines are outlined above.   Tests Ordered: No orders of the defined types were placed in this encounter.   Medication Changes: No orders of the defined types were placed in this encounter.    Note: This dictation was prepared with Dragon dictation along with smaller phrase technology. Similar sounding words can be transcribed inadequately or may not be corrected upon review. Any transcriptional errors that result from this process are unintentional.      Disposition:  Follow up  Signed, Meldon Sport, MD  09/17/2023 11:42 AM     [facility]

## 2023-09-24 ENCOUNTER — Ambulatory Visit: Payer: Federal, State, Local not specified - PPO | Admitting: "Endocrinology

## 2023-09-30 ENCOUNTER — Other Ambulatory Visit (HOSPITAL_COMMUNITY): Payer: Self-pay | Admitting: Obstetrics & Gynecology

## 2023-09-30 DIAGNOSIS — Z1231 Encounter for screening mammogram for malignant neoplasm of breast: Secondary | ICD-10-CM

## 2023-10-01 ENCOUNTER — Ambulatory Visit (HOSPITAL_COMMUNITY)
Admission: RE | Admit: 2023-10-01 | Discharge: 2023-10-01 | Disposition: A | Discharge status: Home / Self Care | Source: Ambulatory Visit | Attending: Obstetrics & Gynecology | Admitting: Obstetrics & Gynecology

## 2023-10-01 ENCOUNTER — Encounter (HOSPITAL_COMMUNITY): Payer: Self-pay

## 2023-10-01 DIAGNOSIS — Z1231 Encounter for screening mammogram for malignant neoplasm of breast: Secondary | ICD-10-CM | POA: Diagnosis present

## 2023-10-05 ENCOUNTER — Encounter: Payer: Self-pay | Admitting: Internal Medicine

## 2023-10-05 ENCOUNTER — Other Ambulatory Visit: Payer: Self-pay | Admitting: Internal Medicine

## 2023-10-05 DIAGNOSIS — F3341 Major depressive disorder, recurrent, in partial remission: Secondary | ICD-10-CM

## 2023-10-05 MED ORDER — CITALOPRAM HYDROBROMIDE 20 MG PO TABS
20.0000 mg | ORAL_TABLET | Freq: Every day | ORAL | 3 refills | Status: DC
Start: 2023-10-05 — End: 2023-12-01

## 2023-11-24 LAB — LIPID PANEL
Chol/HDL Ratio: 2.6 ratio (ref 0.0–4.4)
Cholesterol, Total: 195 mg/dL (ref 100–199)
HDL: 75 mg/dL (ref >39)
LDL Chol Calc (NIH): 104 mg/dL — ABNORMAL HIGH (ref 0–99)
Triglycerides: 91 mg/dL (ref 0–149)
VLDL Cholesterol Cal: 16 mg/dL (ref 5–40)

## 2023-11-24 LAB — T4, FREE: Free T4: 1.19 ng/dL (ref 0.82–1.77)

## 2023-11-24 LAB — TSH: TSH: 1.86 u[IU]/mL (ref 0.450–4.500)

## 2023-11-30 ENCOUNTER — Ambulatory Visit (INDEPENDENT_AMBULATORY_CARE_PROVIDER_SITE_OTHER): Admitting: "Endocrinology

## 2023-11-30 ENCOUNTER — Encounter: Payer: Self-pay | Admitting: "Endocrinology

## 2023-11-30 ENCOUNTER — Encounter: Payer: Self-pay | Admitting: Internal Medicine

## 2023-11-30 VITALS — BP 132/68 | HR 60 | Ht 67.0 in | Wt 200.4 lb

## 2023-11-30 DIAGNOSIS — E782 Mixed hyperlipidemia: Secondary | ICD-10-CM | POA: Diagnosis not present

## 2023-11-30 DIAGNOSIS — E890 Postprocedural hypothyroidism: Secondary | ICD-10-CM | POA: Diagnosis not present

## 2023-11-30 DIAGNOSIS — R7303 Prediabetes: Secondary | ICD-10-CM | POA: Diagnosis not present

## 2023-11-30 LAB — POCT GLYCOSYLATED HEMOGLOBIN (HGB A1C)

## 2023-11-30 NOTE — Patient Instructions (Signed)

## 2023-11-30 NOTE — Progress Notes (Signed)
 11/30/2023, 2:50 PM   Endocrinology follow-up note  Subjective:    Patient ID: Molly Knight, female    DOB: 1955-04-26, PCP Tobie Suzzane POUR, MD   Past Medical History:  Diagnosis Date   Allergy    Seasonal   Depression    GERD (gastroesophageal reflux disease)    Heart murmur 1965   Hiatal hernia    Hypertension 2023   Reactive airway disease 08/11/2023   Thyroid  disease    Thyroid  nodule 09/27/2015   Last Assessment & Plan:  Formatting of this note might be different from the original. Stable.  Her left lobe is normal in size on physical exam with no palpable nodule.   Past Surgical History:  Procedure Laterality Date   ABDOMINAL HYSTERECTOMY  2003   bladder tach     CESAREAN SECTION     COLONOSCOPY  04/2019   [place]  - Dr. Norval - repeat Jan 2025 per patient.   ESOPHAGOGASTRODUODENOSCOPY (EGD) WITH PROPOFOL  N/A 10/09/2022   Procedure: ESOPHAGOGASTRODUODENOSCOPY (EGD) WITH PROPOFOL ;  Surgeon: Eartha Angelia Sieving, MD;  [facility];  Service: Gastroenterology;  Laterality: N/A;  12:30pm;asa 2   SAVORY DILATION  10/09/2022   Procedure: SAVORY DILATION;  Surgeon: Eartha Angelia, Sieving, MD;  [facility];  Service: Gastroenterology;;   SHOULDER SURGERY Bilateral 02/2019   THYROIDECTOMY, PARTIAL  10/2016   TUBAL LIGATION  1991   Social History   Socioeconomic History   Marital status: Married    Spouse name: Not on file   Number of children: Not on file   Years of education: Not on file   Highest education level: Associate degree: occupational, Scientist, product/process development, or vocational program  Occupational History   Not on file  Tobacco Use   Smoking status: Never    Passive exposure: Never   Smokeless tobacco: Never  Vaping Use   Vaping status: Never Used  Substance and Sexual Activity   Alcohol use: Not Currently   Drug use: Never   Sexual activity: Not Currently    Birth  control/protection: Surgical    Comment: hyst  Other Topics Concern   Not on file  Social History Narrative   Not on file   Social Drivers of Health   Financial Resource Strain: Low Risk  (07/16/2023)   Overall Financial Resource Strain (CARDIA)    Difficulty of Paying Living Expenses: Not hard at all  Food Insecurity: No Food Insecurity (07/16/2023)   Hunger Vital Sign    Worried About Running Out of Food in the Last Year: Never true    Ran Out of Food in the Last Year: Never true  Transportation Needs: No Transportation Needs (07/16/2023)   PRAPARE - Administrator, Civil Service (Medical): No    Lack of Transportation (Non-Medical): No  Physical Activity: Insufficiently Active (07/16/2023)   Exercise Vital Sign    Days of Exercise per Week: 3 days    Minutes of Exercise per Session: 20 min  Stress: No Stress Concern Present (07/16/2023)   [employer] of Occupational Health - Occupational Stress Questionnaire    Feeling of Stress : Not at all  Social Connections: Moderately Integrated (07/16/2023)   Social Connection and Isolation Panel  Frequency of Communication with Friends and Family: Once a week    Frequency of Social Gatherings with Friends and Family: Once a week    Attends Religious Services: 1 to 4 times per year    Active Member of [employer] or Organizations: No    Attends Engineer, structural: 1 to 4 times per year    Marital Status: Married   Family History  Problem Relation Age of Onset   Hypertension Mother    Aortic aneurysm Mother    Heart disease Mother    Renal Disease Father    Alcohol abuse Father    Asthma Father    Heart disease Father    Colon cancer Sister    Cancer Sister    Early death Sister    Heart attack Sister    Heart disease Sister    Other Sister        tahycardia   Throat cancer Brother    Cancer Brother    Other Brother        heart valve surgery   Heart disease Sister    Outpatient Encounter Medications  as of 11/30/2023  Medication Sig   albuterol  (VENTOLIN  HFA) 108 (90 Base) MCG/ACT inhaler Inhale 1-2 puffs into the lungs every 6 (six) hours as needed for wheezing or shortness of breath.   atorvastatin  (LIPITOR) 20 MG tablet Take 1 tablet (20 mg total) by mouth daily. (Patient taking differently: Take 20 mg by mouth every other day.)   citalopram  (CELEXA ) 20 MG tablet Take 1 tablet (20 mg total) by mouth daily.   estradiol  (ESTRACE ) 0.1 MG/GM vaginal cream USE 0.5 GM IN THE VAGINA FOR 2 WEEKS THEN 2-3 X WEEKLY   estradiol  (VIVELLE -DOT) 0.05 MG/24HR patch Place 1 patch (0.05 mg total) onto the skin 2 (two) times a week.   levothyroxine  (SYNTHROID ) 75 MCG tablet TAKE 1 TABLET(75 MCG) BY MOUTH DAILY BEFORE BREAKFAST   losartan  (COZAAR ) 50 MG tablet TAKE 1 TABLET(50 MG) BY MOUTH DAILY   Multiple Vitamins-Minerals (MULTI VITAMIN/MINERALS) TABS Take 1 tablet by mouth daily.   pantoprazole  (PROTONIX ) 40 MG tablet Take 1 tablet (40 mg total) by mouth daily.   No facility-administered encounter medications on file as of 11/30/2023.   ALLERGIES: No Known Allergies  VACCINATION STATUS: Immunization History  Administered Date(s) Administered   Fluad Quad(high Dose 65+) 12/31/2022   Influenza,inj,Quad PF,6+ Mos 01/23/2019, 01/10/2020, 12/18/2020, 01/09/2022   Moderna Covid-19 Vaccine Bivalent Booster 69yrs & up 03/01/2021   Moderna Sars-Covid-2 Vaccination 05/17/2019, 05/20/2019, 06/17/2019, 04/04/2020   PNEUMOCOCCAL CONJUGATE-20 05/13/2022   Tdap 03/24/2014   Unspecified SARS-COV-2 Vaccination 01/29/2023, 08/24/2023   Zoster Recombinant(Shingrix) 08/07/2017, 10/16/2017    HPI Molly Knight is 68 y.o. female who presents today with a medical history as above. she is being seen in follow-up after she was seen in consultation for postsurgical hypothyroidism requested by Tobie Suzzane POUR, MD.    She is known to have multinodular goiter prior to 2018 when she underwent right thyroid  Lobectomy in  [place] .  Her surgical report shows benign hyperplastic follicular nodule with partial cystic degeneration and fibrosis.   She was started on levothyroxine  thyroid  hormone replacement over subsequent years.  She remains on levothyroxine  75 mcg p.o. daily before breakfast.   She reports much better consistency taking this medication.  Her previsit thyroid /neck ultrasound documents 0.8 cm thyroid  residual right thyroid  bed, and 3 cm left thyroid  Lobe is no nodules and no cervical lymphadenopathy.  Her most recent thyroid  function tests  are with appropriate replacement.    She denies dysphagia, shortness breath, nor voice change. She denies any family history of thyroid  malignancy.  Her other medical problems include hyperlipidemia, prediabetes.  She is on Crestor  10 mg p.o. nightly.  She is on ongoing calcium /vitamin D  supplement.  Her point-of-care A1c today is 5.9%. She denies palpitations, tremors.  She reports mild heat intolerance. She is not following any particular dietary program for exercise program.   Review of Systems  Constitutional: + Progressively gaining weight,  no fatigue, no subjective hyperthermia, no subjective hypothermia Eyes: no blurry vision, no xerophthalmia ENT: no sore throat, no nodules palpated in throat, no dysphagia/odynophagia, no hoarseness   Objective:       11/30/2023   11:12 AM 09/17/2023   11:12 AM 08/11/2023    1:02 PM  Vitals with BMI  Height 5' 7 5' 7 5' 7  Weight 200 lbs 6 oz 199 lbs 200 lbs 13 oz  BMI 31.38 31.16 31.44  Systolic 132  130  Diastolic 68  80  Pulse 60  75    BP 132/68   Pulse 60   Ht 5' 7 (1.702 m)   Wt 200 lb 6.4 oz (90.9 kg)   LMP  (LMP Unknown)   BMI 31.39 kg/m   Wt Readings from Last 3 Encounters:  11/30/23 200 lb 6.4 oz (90.9 kg)  09/17/23 199 lb (90.3 kg)  08/11/23 200 lb 12.8 oz (91.1 kg)    Physical Exam  Constitutional:  Body mass index is 31.39 kg/m.,  not in acute distress, normal state of mind Eyes:  PERRLA, EOMI, no exophthalmos ENT: moist mucous membranes, + scar from her remote past surgery, no gross thyromegaly, no gross cervical lymphadenopathy   CMP ( most recent) CMP     Component Value Date/Time   NA 139 07/13/2023 0813   K 4.3 07/13/2023 0813   CL 103 07/13/2023 0813   CO2 23 07/13/2023 0813   GLUCOSE 96 07/13/2023 0813   GLUCOSE 145 (H) 11/09/2022 1119   BUN 11 07/13/2023 0813   CREATININE 0.75 07/13/2023 0813   CALCIUM  9.1 07/13/2023 0813   PROT 6.0 07/13/2023 0813   ALBUMIN 4.1 07/13/2023 0813   AST 18 07/13/2023 0813   ALT 14 07/13/2023 0813   ALKPHOS 81 07/13/2023 0813   BILITOT 0.4 07/13/2023 0813   EGFR 88 07/13/2023 0813   GFRNONAA >60 11/09/2022 1119     Diabetic Labs (most recent): Lab Results  Component Value Date   HGBA1C 5.8 (H) 07/13/2023   HGBA1C 5.9 03/26/2023   HGBA1C 6.2 (H) 11/14/2022     Lipid Panel ( most recent) Lipid Panel     Component Value Date/Time   CHOL 195 11/23/2023 0817   TRIG 91 11/23/2023 0817   HDL 75 11/23/2023 0817   CHOLHDL 2.6 11/23/2023 0817   LDLCALC 104 (H) 11/23/2023 0817   LABVLDL 16 11/23/2023 0817      Lab Results  Component Value Date   TSH 1.860 11/23/2023   TSH 1.710 03/18/2023   TSH 1.840 11/14/2022   TSH 1.920 01/24/2022   TSH 2.110 06/10/2021   FREET4 1.19 11/23/2023   FREET4 1.15 03/18/2023   FREET4 1.33 11/14/2022   FREET4 1.26 01/24/2022   FREET4 1.08 06/10/2021     Previously thyroid /neck ultrasound on December 15, 2022 0.8 x 0.8 cm echogenic tissue in the right thyroidectomy bed is suspicious for residual thyroid  tissue. No nodule is identified.   IMPRESSION: 1. 0.8 x 0.8  cm echogenic tissue in the right thyroidectomy bed is suspicious for residual thyroid  tissue. No thyroid  nodules are identified. 2. No enlarged neck lymph nodes.   Assessment & Plan:   1. Postoperative hypothyroidism 2. Mixed hyperlipidemia 3. Prediabetes-point-of-care A1c today 5.9%   she has a right  thyroid  lobectomy in 2018 with subsequent postsurgical hypothyroidism. Her previsit thyroid  ultrasound is unremarkable except that she has a 3 cm left lobe of the thyroid .  Her previsit thyroid  function tests are consistent with appropriate replacement.  She is advised to continue levothyroxine  75 mcg p.o. daily before breakfast.    - We discussed about the correct intake of her thyroid  hormone, on empty stomach at fasting, with water, separated by at least 30 minutes from breakfast and other medications,  and separated by more than 4 hours from calcium , iron, multivitamins, acid reflux medications (PPIs). -Patient is made aware of the fact that thyroid  hormone replacement is needed for life, dose to be adjusted by periodic monitoring of thyroid  function tests.    In light of her metabolic dysfunction indicated by hyperlipidemia and prediabetes, she is advised to continue Crestor  10 mg p.o. nightly.  Her point-of-care A1c is 5.9%.  She was approached with an option of metformin to delay progress she wishes to avoid pharmaceutical intervention at this time.   - she acknowledges that there is a room for improvement in her food and drink choices. - Suggestion is made for her to avoid simple carbohydrates  from her diet including Cakes, Sweet Desserts, Ice Cream, Soda (diet and regular), Sweet Tea, Candies, Chips, Cookies, Store Bought Juices, Alcohol , Artificial Sweeteners,  Coffee Creamer, and Sugar-free Products, Lemonade. This will help patient to have more stable blood glucose profile and potentially avoid unintended weight gain.   - she is advised to maintain close follow up with Tobie Suzzane POUR, MD for primary care needs.   I spent  25  minutes in the care of the patient today including review of labs from Thyroid  Function, CMP, and other relevant labs ; imaging/biopsy records (current and previous including abstractions from other facilities); face-to-face time discussing  her lab results and  symptoms, medications doses, her options of short and long term treatment based on the latest standards of care / guidelines;   and documenting the encounter.  Molly Knight  participated in the discussions, expressed understanding, and voiced agreement with the above plans.  All questions were answered to her satisfaction. she is encouraged to contact clinic should she have any questions or concerns prior to her return visit.   Follow up plan: Return in about 6 months (around 06/01/2024) for Fasting Labs  in AM B4 8, A1c -NV.   Ranny Earl, MD [facility] [address] [place], [place] [address] Phone: [phone]  Fax: [phone]     11/30/2023, 2:50 PM  This note was partially dictated with voice recognition software. Similar sounding words can be transcribed inadequately or may not  be corrected upon review.

## 2023-12-01 ENCOUNTER — Other Ambulatory Visit: Payer: Self-pay | Admitting: Internal Medicine

## 2023-12-01 ENCOUNTER — Other Ambulatory Visit: Payer: Self-pay

## 2023-12-01 DIAGNOSIS — E890 Postprocedural hypothyroidism: Secondary | ICD-10-CM

## 2023-12-01 DIAGNOSIS — F3341 Major depressive disorder, recurrent, in partial remission: Secondary | ICD-10-CM

## 2023-12-01 MED ORDER — LEVOTHYROXINE SODIUM 75 MCG PO TABS
75.0000 ug | ORAL_TABLET | Freq: Every day | ORAL | 1 refills | Status: AC
Start: 2023-12-01 — End: ?

## 2023-12-11 ENCOUNTER — Encounter: Payer: Self-pay | Admitting: Radiology

## 2023-12-18 ENCOUNTER — Other Ambulatory Visit: Payer: Self-pay | Admitting: Internal Medicine

## 2023-12-18 DIAGNOSIS — I10 Essential (primary) hypertension: Secondary | ICD-10-CM

## 2023-12-23 ENCOUNTER — Encounter: Payer: Self-pay | Admitting: Orthopaedic Surgery

## 2023-12-23 ENCOUNTER — Ambulatory Visit (INDEPENDENT_AMBULATORY_CARE_PROVIDER_SITE_OTHER): Admitting: Orthopaedic Surgery

## 2023-12-23 VITALS — BP 136/78 | HR 81 | Ht 67.0 in | Wt 199.0 lb

## 2023-12-23 DIAGNOSIS — G8929 Other chronic pain: Secondary | ICD-10-CM | POA: Diagnosis not present

## 2023-12-23 DIAGNOSIS — M545 Low back pain, unspecified: Secondary | ICD-10-CM

## 2023-12-23 MED ORDER — PREDNISONE 5 MG (21) PO TBPK
ORAL_TABLET | ORAL | 0 refills | Status: DC
Start: 2023-12-23 — End: 2024-01-05

## 2023-12-23 NOTE — Progress Notes (Signed)
 My back hurts.  She has lower midline back pain that does not radiate.  It gets worse as the day goes on or if she is up and walking a lot.  She has no trauma, no weakness, no redness.  She cannot take NSAIDs.    She has full ROM of the lumbar spine and can touch her toes without difficulty, NV intact, muscle tone and strength normal, SLR negative,  Gait is normal.  Encounter Diagnosis  Name Primary?   Chronic midline low back pain without sciatica Yes   I will begin PT at [facility].  I will begin prednisone  dose pack.  Return in one week.  She may need X-rays on return, consideration of MRI depending on how she does.  Call if any problem.  Precautions discussed.  Electronically Signed Lemond Stable, MD 9/3/20252:20 PM

## 2023-12-30 ENCOUNTER — Ambulatory Visit: Admitting: Orthopaedic Surgery

## 2023-12-30 ENCOUNTER — Encounter: Payer: Self-pay | Admitting: Orthopaedic Surgery

## 2023-12-30 VITALS — BP 136/78 | Ht 67.0 in | Wt 199.0 lb

## 2023-12-30 DIAGNOSIS — G8929 Other chronic pain: Secondary | ICD-10-CM

## 2023-12-30 DIAGNOSIS — M545 Low back pain, unspecified: Secondary | ICD-10-CM

## 2023-12-30 NOTE — Progress Notes (Signed)
 I am better.  The prednisone  really helped her back.  She has PT scheduled next week.  She is feeling much better.  She has no numbness.  Her lower back has good ROM, muscle tone and strength normal, gait normal, NV intact.  Encounter Diagnosis  Name Primary?   Chronic midline low back pain without sciatica Yes   Begin Aleve  two tablets twice a day after eating.  Return in three weeks.  Call if any problem.  Precautions discussed.  Electronically Signed Lemond Stable, MD 9/10/20252:26 PM

## 2024-01-05 ENCOUNTER — Ambulatory Visit: Payer: Federal, State, Local not specified - PPO

## 2024-01-05 VITALS — BP 120/64 | Ht 67.0 in | Wt 196.0 lb

## 2024-01-05 DIAGNOSIS — Z0000 Encounter for general adult medical examination without abnormal findings: Secondary | ICD-10-CM

## 2024-01-05 NOTE — Progress Notes (Signed)
 Subjective:   Molly Knight is a 68 y.o. who presents for a Medicare Wellness preventive visit. As a reminder, Annual Wellness Visits don't include a physical exam, and some assessments may be limited, especially if this visit is performed virtually. We may recommend an in-person follow-up visit with your provider if needed. Visit Complete: Virtual I connected with  Molly Knight on 01/05/24 by a video and audio enabled telemedicine application and verified that I am speaking with the correct person using two identifiers.  Patient Location: Home  Provider Location: Home Office  I discussed the limitations of evaluation and management by telemedicine. The patient expressed understanding and agreed to proceed.  Vital Signs: Because this visit was a virtual/telehealth visit, some criteria may be missing or patient reported. Any vitals not documented were not able to be obtained and vitals that have been documented are patient reported.  Persons Participating in Visit: Patient. AWV Questionnaire: Yes: Patient Medicare AWV questionnaire was completed by the patient on 12/31/2023; I have confirmed that all information answered by patient is correct and no changes since this date. Cardiac Risk Factors include: advanced age (>7men, >68 women);dyslipidemia;hypertension;obesity (BMI >30kg/m2)    Objective:    Today's Vitals   12/31/23 1059 01/05/24 1350  BP:  120/64  Weight:  196 lb (88.9 kg)  Height:  5' 7 (1.702 m)  PainSc: 7     Body mass index is 30.7 kg/m.    01/05/2024    1:49 PM 12/31/2022    9:11 AM 11/09/2022   11:00 AM 10/09/2022   11:08 AM  Advanced Directives  Does Patient Have a Medical Advance Directive? No No No No  Would patient like information on creating a medical advance directive? No - Patient declined Yes (MAU/Ambulatory/Procedural Areas - Information given)  No - Patient declined   Current Medications (verified) Outpatient Encounter Medications as of  01/05/2024  Medication Sig   atorvastatin  (LIPITOR) 20 MG tablet Take 1 tablet (20 mg total) by mouth daily. (Patient taking differently: Take 20 mg by mouth every other day.)   estradiol  (ESTRACE ) 0.1 MG/GM vaginal cream USE 0.5 GM IN THE VAGINA FOR 2 WEEKS THEN 2-3 X WEEKLY   estradiol  (VIVELLE -DOT) 0.05 MG/24HR patch Place 1 patch (0.05 mg total) onto the skin 2 (two) times a week.   FLUoxetine  (PROZAC ) 20 MG capsule TAKE 1 CAPSULE(20 MG) BY MOUTH DAILY   levothyroxine  (SYNTHROID ) 75 MCG tablet Take 1 tablet (75 mcg total) by mouth daily before breakfast.   losartan  (COZAAR ) 50 MG tablet TAKE 1 TABLET(50 MG) BY MOUTH DAILY   Multiple Vitamins-Minerals (MULTI VITAMIN/MINERALS) TABS Take 1 tablet by mouth daily.   pantoprazole  (PROTONIX ) 40 MG tablet Take 1 tablet (40 mg total) by mouth daily.   [DISCONTINUED] albuterol  (VENTOLIN  HFA) 108 (90 Base) MCG/ACT inhaler Inhale 1-2 puffs into the lungs every 6 (six) hours as needed for wheezing or shortness of breath.   [DISCONTINUED] budesonide -formoterol  (SYMBICORT ) 80-4.5 MCG/ACT inhaler Inhale 2 puffs into the lungs 2 (two) times daily.   [DISCONTINUED] predniSONE  (STERAPRED UNI-PAK 21 TAB) 5 MG (21) TBPK tablet Take 6 pills first day; 5 pills second day; 4 pills third day; 3 pills fourth day; 2 pills next day and 1 pill last day.   No facility-administered encounter medications on file as of 01/05/2024.   Allergies (verified) Patient has no known allergies.  History: Past Medical History:  Diagnosis Date   Allergy    Seasonal   Depression    GERD (gastroesophageal reflux  disease)    Heart murmur 1965   Hiatal hernia    Hypertension 2023   Reactive airway disease 08/11/2023   Thyroid  disease    Thyroid  nodule 09/27/2015   Last Assessment & Plan:  Formatting of this note might be different from the original. Stable.  Her left lobe is normal in size on physical exam with no palpable nodule.   Past Surgical History:  Procedure Laterality  Date   ABDOMINAL HYSTERECTOMY  2003   bladder tach     CESAREAN SECTION     COLONOSCOPY  04/2019   [place]  - Dr. Norval - repeat Jan 2025 per patient.   ESOPHAGOGASTRODUODENOSCOPY (EGD) WITH PROPOFOL  N/A 10/09/2022   Procedure: ESOPHAGOGASTRODUODENOSCOPY (EGD) WITH PROPOFOL ;  Surgeon: Eartha Angelia Sieving, MD;  [facility];  Service: Gastroenterology;  Laterality: N/A;  12:30pm;asa 2   SAVORY DILATION  10/09/2022   Procedure: SAVORY DILATION;  Surgeon: Eartha Angelia, Sieving, MD;  Location: [facility];  Service: Gastroenterology;;   SHOULDER SURGERY Bilateral 02/2019   THYROIDECTOMY, PARTIAL  10/2016   TUBAL LIGATION  1991   Family History  Problem Relation Age of Onset   Hypertension Mother    Aortic aneurysm Mother    Heart disease Mother    Renal Disease Father    Alcohol abuse Father    Asthma Father    Heart disease Father    Colon cancer Sister    Cancer Sister    Early death Sister    Heart attack Sister    Heart disease Sister    Other Sister        tahycardia   Throat cancer Brother    Cancer Brother    Other Brother        heart valve surgery   Heart disease Sister    Social History   Socioeconomic History   Marital status: Married    Spouse name: Not on file   Number of children: Not on file   Years of education: Not on file   Highest education level: Associate degree: occupational, Scientist, product/process development, or vocational program  Occupational History   Not on file  Tobacco Use   Smoking status: Never    Passive exposure: Never   Smokeless tobacco: Never  Vaping Use   Vaping status: Never Used  Substance and Sexual Activity   Alcohol use: Not Currently   Drug use: Never   Sexual activity: Yes    Birth control/protection: Post-menopausal, Surgical    Comment: hyst  Other Topics Concern   Not on file  Social History Narrative   Not on file   Social Drivers of Health   Financial Resource Strain: Low Risk  (01/05/2024)   Overall  Financial Resource Strain (CARDIA)    Difficulty of Paying Living Expenses: Not hard at all  Food Insecurity: No Food Insecurity (01/05/2024)   Hunger Vital Sign    Worried About Running Out of Food in the Last Year: Never true    Ran Out of Food in the Last Year: Never true  Transportation Needs: No Transportation Needs (01/05/2024)   PRAPARE - Administrator, Civil Service (Medical): No    Lack of Transportation (Non-Medical): No  Physical Activity: Insufficiently Active (01/05/2024)   Exercise Vital Sign    Days of Exercise per Week: 3 days    Minutes of Exercise per Session: 20 min  Stress: No Stress Concern Present (01/05/2024)   [employer] of Occupational Health - Occupational Stress Questionnaire  Feeling of Stress: Not at all  Social Connections: Moderately Integrated (01/05/2024)   Social Connection and Isolation Panel    Frequency of Communication with Friends and Family: Once a week    Frequency of Social Gatherings with Friends and Family: Once a week    Attends Religious Services: 1 to 4 times per year    Active Member of [employer] or Organizations: No    Attends Engineer, structural: 1 to 4 times per year    Marital Status: Married   Tobacco Counseling Counseling given: Yes  Clinical Intake: Pre-visit preparation completed: Yes Pain : 0-10 Pain Score: 7  Pain Type: Chronic pain Pain Location: Back Pain Orientation: Lower Pain Descriptors / Indicators: Sharp, Tightness Pain Onset: More than a month ago Pain Frequency: Intermittent   BMI - recorded: 30.7 Nutritional Risks: None Diabetes: No Lab Results  Component Value Date   HGBA1C 5.8 (H) 07/13/2023   HGBA1C 5.9 03/26/2023   HGBA1C 6.2 (H) 11/14/2022    How often do you need to have someone help you when you read instructions, pamphlets, or other written materials from your doctor or pharmacy?: 1 - Never Interpreter Needed?: No Information entered by :: Diella Gillingham W CMA (AAMA) Activities  of Daily Living     12/31/2023   10:59 AM  In your present state of health, do you have any difficulty performing the following activities:  Hearing? 0  Vision? 0  Difficulty concentrating or making decisions? 0  Walking or climbing stairs? 0  Dressing or bathing? 0  Doing errands, shopping? 0  Preparing Food and eating ? N  Using the Toilet? N  In the past six months, have you accidently leaked urine? N  Do you have problems with loss of bowel control? N  Managing your Medications? N  Managing your Finances? N  Housekeeping or managing your Housekeeping? N    Patient Care Team: Tobie Suzzane POUR, MD as PCP - General (Internal Medicine) Stacia Diannah SQUIBB, MD as Consulting Physician (Cardiology) Brenna Lin, MD as Consulting Physician (Orthopedic Surgery) Darroll Anes, DO (Optometry) Lenis Ethelle ORN, MD as Consulting Physician (Endocrinology)  I have updated your Care Teams any recent Medical Services you may have received from other providers in the past year.     Assessment:   This is a routine wellness examination for [place].  Hearing/Vision screen Hearing Screening - Comments:: Patient denies any hearing difficulties.   Vision Screening - Comments:: Wears rx glasses - up to date with routine eye exams with  Anes Darroll w/ [facility] location   Goals Addressed             This Visit's Progress    patient stated   On track    Lose weight, move more, and get off some of these medications       Depression Screen     01/05/2024    1:57 PM 09/17/2023   11:13 AM 08/11/2023    1:06 PM 03/23/2023    8:17 AM 12/31/2022    9:18 AM 11/19/2022    3:26 PM 07/14/2022   10:56 AM  PHQ 2/9 Scores  PHQ - 2 Score 0 2 4 0 0 0 3  PHQ- 9 Score 0 13 11 8   11     Fall Risk     12/31/2023   10:59 AM 09/17/2023   11:13 AM 08/11/2023    1:06 PM 03/23/2023    8:16 AM 12/24/2022   12:03 PM  Fall  Risk   Falls in the past year? 1 0 0 0 0  Number falls in  past yr: 0 0 0 0 0  Injury with Fall? 0 0 0 0 0  Risk for fall due to : No Fall Risks No Fall Risks No Fall Risks No Fall Risks No Fall Risks  Follow up Falls evaluation completed;Education provided;Falls prevention discussed Falls evaluation completed Falls evaluation completed Falls evaluation completed Falls prevention discussed    MEDICARE RISK AT HOME:  Medicare Risk at Home Any stairs in or around the home?: (Patient-Rptd) Yes If so, are there any without handrails?: (Patient-Rptd) No Home free of loose throw rugs in walkways, pet beds, electrical cords, etc?: (Patient-Rptd) Yes Adequate lighting in your home to reduce risk of falls?: (Patient-Rptd) Yes Life alert?: (Patient-Rptd) No Use of a cane, walker or w/c?: (Patient-Rptd) No Grab bars in the bathroom?: (Patient-Rptd) No Shower chair or bench in shower?: (Patient-Rptd) No Elevated toilet seat or a handicapped toilet?: (Patient-Rptd) No  TIMED UP AND GO:  Was the test performed?  No  Cognitive Function: 6CIT completed        01/05/2024    1:56 PM 12/31/2022    9:16 AM  6CIT Screen  What Year? 0 points 0 points  What month? 0 points 0 points  What time? 0 points 0 points  Count back from 20 0 points 0 points  Months in reverse 0 points 0 points  Repeat phrase 0 points 0 points  Total Score 0 points 0 points    Immunizations Immunization History  Administered Date(s) Administered   Fluad Quad(high Dose 65+) 12/31/2022   Influenza,inj,Quad PF,6+ Mos 01/23/2019, 01/10/2020, 12/18/2020, 01/09/2022   Moderna Covid-19 Vaccine Bivalent Booster 52yrs & up 03/01/2021   Moderna Sars-Covid-2 Vaccination 05/17/2019, 05/20/2019, 06/17/2019, 04/04/2020   PNEUMOCOCCAL CONJUGATE-20 05/13/2022   Tdap 03/24/2014   Unspecified SARS-COV-2 Vaccination 01/29/2023, 08/24/2023   Zoster Recombinant(Shingrix) 08/07/2017, 10/16/2017    Screening Tests Health Maintenance  Topic Date Due   Influenza Vaccine  11/20/2023    DTaP/Tdap/Td (2 - Td or Tdap) 03/24/2024   Colonoscopy  05/15/2024   DEXA SCAN  08/03/2024   Mammogram  09/30/2024   Medicare Annual Wellness (AWV)  01/04/2025   Pneumococcal Vaccine: 50+ Years  Completed   COVID-19 Vaccine  Completed   Hepatitis C Screening  Completed   Zoster Vaccines- Shingrix  Completed   HPV VACCINES  Aged Out   Meningococcal B Vaccine  Aged Out    Health Maintenance  Health Maintenance Due  Topic Date Due   Influenza Vaccine  11/20/2023   Health Maintenance Items Addressed: Routine health screenings are up to date. Patient is aware of recommended vaccines.   Additional Screening:  Vision Screening: Recommended annual ophthalmology exams for early detection of glaucoma and other disorders of the eye. Would you like a referral to an eye doctor? No    Dental Screening: Recommended annual dental exams for proper oral hygiene  Community Resource Referral / Chronic Care Management: CRR required this visit?  No   CCM required this visit?  No  Plan:   I have personally reviewed and noted the following in the patient's chart:   Medical and social history Use of alcohol, tobacco or illicit drugs  Current medications and supplements including opioid prescriptions. Patient is not currently taking opioid prescriptions. Functional ability and status Nutritional status Physical activity Advanced directives List of other physicians Hospitalizations, surgeries, and ER visits in previous 12 months Vitals Screenings to include  cognitive, depression, and falls Referrals and appointments  In addition, I have reviewed and discussed with patient certain preventive protocols, quality metrics, and best practice recommendations. A written personalized care plan for preventive services as well as general preventive health recommendations were provided to patient.   Haim Hansson, CMA   01/05/2024   After Visit Summary: (MyChart) Due to this being a telephonic visit,  the after visit summary with patients personalized plan was offered to patient via MyChart   Notes: Nothing significant to report at this time.

## 2024-01-05 NOTE — Patient Instructions (Signed)
 Molly Knight,  Thank you for taking the time for your Medicare Wellness Visit. I appreciate your continued commitment to your health goals. Please review the care plan we discussed, and feel free to reach out if I can assist you further.    Ongoing Care Seeing your primary care provider every 3 to 6 months helps us  monitor your health and provide consistent, personalized care.     Referrals  If a referral was made during today's visit and you haven't received any updates within two weeks, please contact the referred provider directly to check on the status.  N/a  Wishing you good health and many blessings for the year to come  -Nickole Adamek    Medicare recommends these wellness visits once per year to help you and your care team stay ahead of potential health issues. These visits are designed to focus on prevention, allowing your provider to concentrate on managing your acute and chronic conditions during your regular appointments.     Please note that Annual Wellness Visits do not include a physical exam. Some assessments may be limited, especially if the visit was conducted virtually. If needed, we may recommend a separate in-person follow-up with your provider.  Recommended Screenings:  Health Maintenance  Topic Date Due   Flu Shot  11/20/2023   DTaP/Tdap/Td vaccine (2 - Td or Tdap) 03/24/2024   Colon Cancer Screening  05/15/2024   DEXA scan (bone density measurement)  08/03/2024   Breast Cancer Screening  09/30/2024   Medicare Annual Wellness Visit  01/04/2025   Pneumococcal Vaccine for age over 65  Completed   COVID-19 Vaccine  Completed   Hepatitis C Screening  Completed   Zoster (Shingles) Vaccine  Completed   HPV Vaccine  Aged Out   Meningitis B Vaccine  Aged Out       01/05/2024    1:49 PM  Advanced Directives  Does Patient Have a Medical Advance Directive? No  Would patient like information on creating a medical advance directive? No - Patient declined    Advance Care  Planning is important because it: Ensures you receive medical care that aligns with your values, goals, and preferences. Provides guidance to your family and loved ones, reducing the emotional burden of decision-making during critical moments.    Vision: Annual vision screenings are recommended for early detection of glaucoma, cataracts, and diabetic retinopathy. These exams can also reveal signs of chronic conditions such as diabetes and high blood pressure.    Dental: Annual dental screenings help detect early signs of oral cancer, gum disease, and other conditions linked to overall health, including heart disease and diabetes.  Please see the attached documents for additional preventive care recommendations.

## 2024-01-19 ENCOUNTER — Other Ambulatory Visit: Payer: Self-pay | Admitting: Internal Medicine

## 2024-01-20 ENCOUNTER — Ambulatory Visit: Admitting: Orthopaedic Surgery

## 2024-01-20 ENCOUNTER — Encounter: Payer: Self-pay | Admitting: Orthopaedic Surgery

## 2024-01-20 VITALS — BP 143/74 | HR 66 | Ht 67.0 in | Wt 196.0 lb

## 2024-01-20 DIAGNOSIS — M545 Low back pain, unspecified: Secondary | ICD-10-CM

## 2024-01-20 DIAGNOSIS — G8929 Other chronic pain: Secondary | ICD-10-CM | POA: Diagnosis not present

## 2024-01-20 NOTE — Progress Notes (Signed)
 My back is a little better.  She has been to PT and her back is slowly improving after the exercises.  She has no new acute episodes.  She is doing the excises.  She has no paresthesias.  Spine/Pelvis examination:  Inspection:  Overall, sacoiliac joint benign and hips nontender; without crepitus or defects.   Thoracic spine inspection: Alignment normal without kyphosis present   Lumbar spine inspection:  Alignment  with normal lumbar lordosis, without scoliosis apparent.   Thoracic spine palpation:  without tenderness of spinal processes   Lumbar spine palpation: without tenderness of lumbar area; without tightness of lumbar muscles    Range of Motion:   Lumbar flexion, forward flexion is normal without pain or tenderness    Lumbar extension is full without pain or tenderness   Left lateral bend is normal without pain or tenderness   Right lateral bend is normal without pain or tenderness   Straight leg raising is normal  Strength & tone: normal   Stability overall normal stability  Encounter Diagnosis  Name Primary?   Chronic midline low back pain without sciatica Yes   I have informed the patient I will be retiring from medical practice and from this office on January 21, 2024.  The patient has been offered continuing care with Dr. Margrette or Dr. Onesimo of this office.  The patient may choose another provider and the records will be forwarded after proper signature and notification.  Patient understands and agrees.  Return prn.  Call if any problem.  Precautions discussed.  Electronically Signed Lemond Stable, MD 10/1/20252:35 PM

## 2024-02-02 LAB — CMP14+EGFR
ALT: 15 IU/L (ref 0–32)
AST: 18 IU/L (ref 0–40)
Albumin: 4 g/dL (ref 3.9–4.9)
Alkaline Phosphatase: 74 IU/L (ref 49–135)
BUN/Creatinine Ratio: 18 (ref 12–28)
BUN: 15 mg/dL (ref 8–27)
Bilirubin Total: 0.4 mg/dL (ref 0.0–1.2)
CO2: 25 mmol/L (ref 20–29)
Calcium: 9.2 mg/dL (ref 8.7–10.3)
Chloride: 100 mmol/L (ref 96–106)
Creatinine, Ser: 0.82 mg/dL (ref 0.57–1.00)
Globulin, Total: 2.3 g/dL (ref 1.5–4.5)
Glucose: 99 mg/dL (ref 70–99)
Potassium: 4.5 mmol/L (ref 3.5–5.2)
Sodium: 135 mmol/L (ref 134–144)
Total Protein: 6.3 g/dL (ref 6.0–8.5)
eGFR: 79 mL/min/1.73 (ref >59)

## 2024-02-02 LAB — LIPID PANEL
Chol/HDL Ratio: 3.3 ratio (ref 0.0–4.4)
Cholesterol, Total: 225 mg/dL — ABNORMAL HIGH (ref 100–199)
HDL: 68 mg/dL (ref >39)
LDL Chol Calc (NIH): 139 mg/dL — ABNORMAL HIGH (ref 0–99)
Triglycerides: 101 mg/dL (ref 0–149)
VLDL Cholesterol Cal: 18 mg/dL (ref 5–40)

## 2024-02-02 LAB — HEMOGLOBIN A1C
Est. average glucose Bld gHb Est-mCnc: 120 mg/dL
Hgb A1c MFr Bld: 5.8 % — ABNORMAL HIGH (ref 4.8–5.6)

## 2024-02-03 ENCOUNTER — Encounter (INDEPENDENT_AMBULATORY_CARE_PROVIDER_SITE_OTHER): Payer: Self-pay | Admitting: Gastroenterology

## 2024-02-10 ENCOUNTER — Encounter: Payer: Self-pay | Admitting: Internal Medicine

## 2024-02-10 ENCOUNTER — Ambulatory Visit: Admitting: Internal Medicine

## 2024-02-10 VITALS — BP 133/76 | HR 60 | Ht 67.0 in | Wt 201.2 lb

## 2024-02-10 DIAGNOSIS — I10 Essential (primary) hypertension: Secondary | ICD-10-CM | POA: Diagnosis not present

## 2024-02-10 DIAGNOSIS — E890 Postprocedural hypothyroidism: Secondary | ICD-10-CM | POA: Diagnosis not present

## 2024-02-10 DIAGNOSIS — K219 Gastro-esophageal reflux disease without esophagitis: Secondary | ICD-10-CM

## 2024-02-10 DIAGNOSIS — Z23 Encounter for immunization: Secondary | ICD-10-CM | POA: Diagnosis not present

## 2024-02-10 DIAGNOSIS — F3341 Major depressive disorder, recurrent, in partial remission: Secondary | ICD-10-CM | POA: Diagnosis not present

## 2024-02-10 DIAGNOSIS — E782 Mixed hyperlipidemia: Secondary | ICD-10-CM

## 2024-02-10 MED ORDER — PRAVASTATIN SODIUM 20 MG PO TABS: 20.0000 mg | ORAL_TABLET | Freq: Every day | ORAL | 1 refills | Status: DC

## 2024-02-10 MED ORDER — FLUOXETINE HCL 20 MG PO CAPS: 20.0000 mg | ORAL_CAPSULE | Freq: Every day | ORAL | 1 refills | Status: DC

## 2024-02-10 NOTE — Progress Notes (Signed)
 Established Patient Office Visit  Subjective:  Patient ID: Molly Knight, female    DOB: 01/31/1956  Age: 68 y.o. MRN: [id number]  CC:  Chief Complaint  Patient presents with   Hypertension    6 month f/u    Fatigue    Feels more fatigued than normal    HPI Molly Knight is a 68 y.o. female with past medical history of postoperative hypothyroidism, hot flashes and depression who presents for f/u of her chronic medical conditions.  BP is wnl today.  She takes losartan  50 mg QD regularly.  Patient denies dizziness, chest pain, dyspnea or palpitations. She was referred to cardiology from ER and had cardiac stress test, which was unremarkable.  She has not had any episode of chest pain or dyspnea since then.  She has stopped taking atorvastatin  20 QD due to severe myalgias and sleep interruptions.  Hypothyroidism: She takes levothyroxine  75 mcg QD.  She denies chronic fatigue, recent change in weight or appetite, tremors or palpitations.  MDD: She takes Prozac  currently.  She tried taking Celexa  recently, but her anhedonia and anxiety were worse with it.  She has mild acid reflux with Prozac , but is overall manageable.  Denies SI or HI currently.  She has been taking Prozac  and uses estradiol  patch for hot flashes. She has mild hot flashes, but is manageable for now.   Past Medical History:  Diagnosis Date   Allergy    Seasonal   Depression    GERD (gastroesophageal reflux disease)    Heart murmur 1965   Hiatal hernia    Hypertension 2023   Reactive airway disease 08/11/2023   Thyroid  disease    Thyroid  nodule 09/27/2015   Last Assessment & Plan:  Formatting of this note might be different from the original. Stable.  Her left lobe is normal in size on physical exam with no palpable nodule.    Past Surgical History:  Procedure Laterality Date   ABDOMINAL HYSTERECTOMY  2003   bladder tach     CESAREAN SECTION     COLONOSCOPY  04/2019   [place]  - Dr.  Norval - repeat Jan 2025 per patient.   ESOPHAGOGASTRODUODENOSCOPY (EGD) WITH PROPOFOL  N/A 10/09/2022   Procedure: ESOPHAGOGASTRODUODENOSCOPY (EGD) WITH PROPOFOL ;  Surgeon: Eartha Angelia Sieving, MD;  Location: [facility];  Service: Gastroenterology;  Laterality: N/A;  12:30pm;asa 2   SAVORY DILATION  10/09/2022   Procedure: SAVORY DILATION;  Surgeon: Eartha Angelia, Sieving, MD;  Location: [facility];  Service: Gastroenterology;;   SHOULDER SURGERY Bilateral 02/2019   THYROIDECTOMY, PARTIAL  10/2016   TUBAL LIGATION  1991    Family History  Problem Relation Age of Onset   Hypertension Mother    Aortic aneurysm Mother    Heart disease Mother    Renal Disease Father    Alcohol abuse Father    Asthma Father    Heart disease Father    Colon cancer Sister    Cancer Sister    Early death Sister    Heart attack Sister    Heart disease Sister    Other Sister        tahycardia   Throat cancer Brother    Cancer Brother    Other Brother        heart valve surgery   Heart disease Sister     Social History   Socioeconomic History   Marital status: Married    Spouse name: Not on file   Number of children: Not  on file   Years of education: Not on file   Highest education level: Associate degree: occupational, Scientist, product/process development, or vocational program  Occupational History   Not on file  Tobacco Use   Smoking status: Never    Passive exposure: Never   Smokeless tobacco: Never  Vaping Use   Vaping status: Never Used  Substance and Sexual Activity   Alcohol use: Not Currently   Drug use: Never   Sexual activity: Yes    Birth control/protection: Post-menopausal, Surgical    Comment: hyst  Other Topics Concern   Not on file  Social History Narrative   Not on file   Social Drivers of Health   Financial Resource Strain: Low Risk  (02/05/2024)   Overall Financial Resource Strain (CARDIA)    Difficulty of Paying Living Expenses: Not hard at all  Food Insecurity: No Food  Insecurity (02/05/2024)   Hunger Vital Sign    Worried About Running Out of Food in the Last Year: Never true    Ran Out of Food in the Last Year: Never true  Transportation Needs: No Transportation Needs (02/05/2024)   PRAPARE - Administrator, Civil Service (Medical): No    Lack of Transportation (Non-Medical): No  Physical Activity: Insufficiently Active (02/05/2024)   Exercise Vital Sign    Days of Exercise per Week: 1 day    Minutes of Exercise per Session: 30 min  Stress: No Stress Concern Present (02/05/2024)   [employer] of Occupational Health - Occupational Stress Questionnaire    Feeling of Stress: Not at all  Social Connections: Unknown (02/05/2024)   Social Connection and Isolation Panel    Frequency of Communication with Friends and Family: Once a week    Frequency of Social Gatherings with Friends and Family: Once a week    Attends Religious Services: Not on Insurance claims handler of Clubs or Organizations: No    Attends Banker Meetings: Not on file    Marital Status: Married  Intimate Partner Violence: Not At Risk (01/05/2024)   Humiliation, Afraid, Rape, and Kick questionnaire    Fear of Current or Ex-Partner: No    Emotionally Abused: No    Physically Abused: No    Sexually Abused: No    Outpatient Medications Prior to Visit  Medication Sig Dispense Refill   estradiol  (ESTRACE ) 0.1 MG/GM vaginal cream USE 0.5 GM IN THE VAGINA FOR 2 WEEKS THEN 2-3 X WEEKLY 42.5 g 3   estradiol  (VIVELLE -DOT) 0.05 MG/24HR patch Place 1 patch (0.05 mg total) onto the skin 2 (two) times a week. 24 patch 3   levothyroxine  (SYNTHROID ) 75 MCG tablet Take 1 tablet (75 mcg total) by mouth daily before breakfast. 90 tablet 1   losartan  (COZAAR ) 50 MG tablet TAKE 1 TABLET(50 MG) BY MOUTH DAILY 90 tablet 1   Multiple Vitamins-Minerals (MULTI VITAMIN/MINERALS) TABS Take 1 tablet by mouth daily.     FLUoxetine  (PROZAC ) 20 MG capsule TAKE 1 CAPSULE(20 MG) BY MOUTH  DAILY 30 capsule 0   atorvastatin  (LIPITOR) 20 MG tablet Take 1 tablet (20 mg total) by mouth daily. (Patient taking differently: Take 20 mg by mouth every other day.) 30 tablet 5   pantoprazole  (PROTONIX ) 40 MG tablet Take 1 tablet (40 mg total) by mouth daily. 90 tablet 1   No facility-administered medications prior to visit.    No Known Allergies  ROS Review of Systems  Constitutional:  Negative for chills and fever.  HENT:  Negative  for congestion, ear discharge and sore throat.   Eyes:  Negative for pain and discharge.  Respiratory:  Positive for shortness of breath (Intermittent). Negative for cough.   Cardiovascular:  Negative for chest pain and palpitations.  Gastrointestinal:  Negative for abdominal pain, diarrhea, nausea and vomiting.  Endocrine: Negative for polydipsia and polyuria.  Genitourinary:  Negative for dysuria and hematuria.  Musculoskeletal:  Positive for arthralgias and back pain. Negative for neck pain and neck stiffness.  Skin:  Negative for rash.  Neurological:  Negative for dizziness and weakness.  Psychiatric/Behavioral:  Negative for agitation and behavioral problems.       Objective:    Physical Exam Constitutional:      General: She is not in acute distress.    Appearance: She is not diaphoretic.  HENT:     Head: Normocephalic and atraumatic.     Nose: No congestion.     Mouth/Throat:     Mouth: Mucous membranes are moist.     Pharynx: No posterior oropharyngeal erythema.  Eyes:     Extraocular Movements: Extraocular movements intact.  Cardiovascular:     Rate and Rhythm: Normal rate and regular rhythm.     Heart sounds: Normal heart sounds. No murmur heard. Pulmonary:     Breath sounds: Normal breath sounds. No wheezing or rales.  Musculoskeletal:     Right lower leg: No edema.     Left lower leg: No edema.  Skin:    General: Skin is warm.     Findings: No rash.  Neurological:     General: No focal deficit present.     Mental  Status: She is alert and oriented to person, place, and time.  Psychiatric:        Mood and Affect: Mood normal.        Behavior: Behavior normal.     BP 133/76   Pulse 60   Ht 5' 7 (1.702 m)   Wt 201 lb 3.2 oz (91.3 kg)   LMP  (LMP Unknown)   SpO2 99%   BMI 31.51 kg/m  Wt Readings from Last 3 Encounters:  02/10/24 201 lb 3.2 oz (91.3 kg)  01/20/24 196 lb (88.9 kg)  01/05/24 196 lb (88.9 kg)    Lab Results  Component Value Date   TSH 1.860 11/23/2023   Lab Results  Component Value Date   WBC 6.7 11/14/2022   HGB 13.1 11/14/2022   HCT 41.3 11/14/2022   MCV 93 11/14/2022   PLT 328 11/14/2022   Lab Results  Component Value Date   NA 135 02/01/2024   K 4.5 02/01/2024   CO2 25 02/01/2024   GLUCOSE 99 02/01/2024   BUN 15 02/01/2024   CREATININE 0.82 02/01/2024   BILITOT 0.4 02/01/2024   ALKPHOS 74 02/01/2024   AST 18 02/01/2024   ALT 15 02/01/2024   PROT 6.3 02/01/2024   ALBUMIN 4.0 02/01/2024   CALCIUM  9.2 02/01/2024   ANIONGAP 7 11/09/2022   EGFR 79 02/01/2024   Lab Results  Component Value Date   CHOL 225 (H) 02/01/2024   Lab Results  Component Value Date   HDL 68 02/01/2024   Lab Results  Component Value Date   LDLCALC 139 (H) 02/01/2024   Lab Results  Component Value Date   TRIG 101 02/01/2024   Lab Results  Component Value Date   CHOLHDL 3.3 02/01/2024   Lab Results  Component Value Date   HGBA1C 5.8 (H) 02/01/2024      Assessment &  Plan:   Problem List Items Addressed This Visit       Cardiovascular and Mediastinum   Essential hypertension - Primary (Chronic)   BP Readings from Last 1 Encounters:  02/10/24 133/76   Well-controlled with losartan  50 mg QD Advised DASH diet and moderate exercise/walking, at least 150 mins/week Advised to check BP at home and contact if BP > 140/90 persistently      Relevant Medications   pravastatin (PRAVACHOL) 20 MG tablet     Digestive   Gastroesophageal reflux disease   Well-controlled  currently  Has stopped taking pantoprazole  40 mg once daily now Advised to take Pepcid  if needed        Endocrine   Postoperative hypothyroidism   Due to h/o multinodular goiter Lab Results  Component Value Date   TSH 1.860 11/23/2023   Takes Levothyroxine  75 mcg QD Followed by endocrinology        Other   Mixed hyperlipidemia (Chronic)   Lipid profile reviewed, LDL worse now as she stopped taking atorvastatin  She has back pain and fatigue with it - has tried Crestor , had similar symptoms Switched to pravastatin 20 mg QD      Relevant Medications   pravastatin (PRAVACHOL) 20 MG tablet   MDD (major depressive disorder), recurrent, in partial remission   Has been well-controlled overall, but states that she has been stressed due to her husband's health, feels more down or depressed on cloudy days On Prozac , was better since moving to a new home      Relevant Medications   FLUoxetine  (PROZAC ) 20 MG capsule   Other Visit Diagnoses       Encounter for immunization       Relevant Orders   Flu vaccine HIGH DOSE PF(Fluzone Trivalent) (Completed)          Meds ordered this encounter  Medications   pravastatin (PRAVACHOL) 20 MG tablet    Sig: Take 1 tablet (20 mg total) by mouth daily.    Dispense:  90 tablet    Refill:  1   FLUoxetine  (PROZAC ) 20 MG capsule    Sig: Take 1 capsule (20 mg total) by mouth daily.    Dispense:  90 capsule    Refill:  1    Follow-up: Return in about 6 months (around 08/10/2024) for HTN and HLD.    Suzzane MARLA Blanch, MD

## 2024-02-10 NOTE — Assessment & Plan Note (Addendum)
 Has been well-controlled overall, but states that she has been stressed due to her husband's health, feels more down or depressed on cloudy days On Prozac , was better since moving to a new home

## 2024-02-10 NOTE — Assessment & Plan Note (Signed)
 BP Readings from Last 1 Encounters:  02/10/24 133/76   Well-controlled with losartan  50 mg QD Advised DASH diet and moderate exercise/walking, at least 150 mins/week Advised to check BP at home and contact if BP > 140/90 persistently

## 2024-02-10 NOTE — Patient Instructions (Addendum)
 Please start taking Pravastatin 20 mg once daily. Please take CoQ10 - 100 mg once daily.  Please continue to take medications as prescribed.  Please continue to follow low salt diet and perform moderate exercise/walking as tolerated.

## 2024-02-10 NOTE — Assessment & Plan Note (Signed)
 Lipid profile reviewed, LDL worse now as she stopped taking atorvastatin  She has back pain and fatigue with it - has tried Crestor , had similar symptoms Switched to pravastatin 20 mg QD

## 2024-02-10 NOTE — Assessment & Plan Note (Addendum)
 Due to h/o multinodular goiter Lab Results  Component Value Date   TSH 1.860 11/23/2023   Takes Levothyroxine  75 mcg QD Followed by endocrinology

## 2024-02-10 NOTE — Assessment & Plan Note (Signed)
 Well-controlled currently  Has stopped taking pantoprazole  40 mg once daily now Advised to take Pepcid  if needed

## 2024-02-22 ENCOUNTER — Encounter: Payer: Self-pay | Admitting: Radiology

## 2024-02-26 ENCOUNTER — Other Ambulatory Visit: Payer: Self-pay | Admitting: Internal Medicine

## 2024-02-26 DIAGNOSIS — F3341 Major depressive disorder, recurrent, in partial remission: Secondary | ICD-10-CM

## 2024-04-01 ENCOUNTER — Encounter: Payer: Self-pay | Admitting: "Endocrinology

## 2024-04-01 ENCOUNTER — Encounter: Payer: Self-pay | Admitting: Internal Medicine

## 2024-04-04 ENCOUNTER — Encounter: Payer: Self-pay | Admitting: Internal Medicine

## 2024-04-04 ENCOUNTER — Other Ambulatory Visit: Payer: Self-pay

## 2024-04-04 DIAGNOSIS — E890 Postprocedural hypothyroidism: Secondary | ICD-10-CM

## 2024-04-04 MED ORDER — LEVOTHYROXINE SODIUM 75 MCG PO TABS: 75.0000 ug | ORAL_TABLET | Freq: Every day | ORAL | 1 refills | Status: AC

## 2024-04-04 NOTE — Telephone Encounter (Signed)
 Chart updated

## 2024-04-05 ENCOUNTER — Other Ambulatory Visit: Payer: Self-pay

## 2024-04-05 DIAGNOSIS — F3341 Major depressive disorder, recurrent, in partial remission: Secondary | ICD-10-CM

## 2024-04-05 MED ORDER — FLUOXETINE HCL 20 MG PO CAPS: 20.0000 mg | ORAL_CAPSULE | Freq: Every day | ORAL | 0 refills | Status: AC

## 2024-04-07 ENCOUNTER — Encounter (INDEPENDENT_AMBULATORY_CARE_PROVIDER_SITE_OTHER): Payer: Self-pay | Admitting: *Deleted

## 2024-05-10 ENCOUNTER — Telehealth (INDEPENDENT_AMBULATORY_CARE_PROVIDER_SITE_OTHER): Payer: Self-pay

## 2024-05-10 NOTE — Telephone Encounter (Signed)
 Who is your primary care physician: Suzzane Blanch, MD  Reasons for the colonoscopy: history of colon polyps, family history colon cancer, screening  Have you had a colonoscopy before?  Yes, 05/16/2019  Do you have family history of colon cancer? Yes, sister  Previous colonoscopy with polyps removed? Yes, 05/16/2019  Do you have a history colorectal cancer?   no  Are you diabetic? If yes, Type 1 or Type 2?    no  Do you have a prosthetic or mechanical heart valve? no  Do you have a pacemaker/defibrillator?   no  Have you had endocarditis/atrial fibrillation? no  Have you had joint replacement within the last 12 months?  no  Do you tend to be constipated or have to use laxatives? yes  Do you have any history of drugs or alcohol?  no  Do you use supplemental oxygen?  no  Have you had a stroke or heart attack within the last 6 months? no  Do you take weight loss medication?  yes  For female patients: have you had a hysterectomy?  yes                                     are you post menopausal?       yes                                            do you still have your menstrual cycle? no      Do you take any blood-thinning medications such as: (aspirin , warfarin, Plavix, Aggrenox)  no  If yes we need the name, milligram, dosage and who is prescribing doctor   Current Outpatient Medications  Medication Sig Dispense Refill   estradiol  (VIVELLE -DOT) 0.05 MG/24HR patch Place 1 patch (0.05 mg total) onto the skin 2 (two) times a week. 24 patch 3   FLUoxetine  (PROZAC ) 20 MG capsule Take 1 capsule (20 mg total) by mouth daily. 90 capsule 0   levothyroxine  (SYNTHROID ) 75 MCG tablet Take 1 tablet (75 mcg total) by mouth daily before breakfast. 90 tablet 1   losartan  (COZAAR ) 50 MG tablet TAKE 1 TABLET(50 MG) BY MOUTH DAILY 90 tablet 1   Multiple Vitamins-Minerals (MULTI VITAMIN/MINERALS) TABS Take 1 tablet by mouth daily.     No current facility-administered medications for this visit.     Allergies[1]  Pharmacy: [facility]  Primary Insurance Name: Medicare  Best number where you can be reached: [phone]     [1] No Known Allergies

## 2024-05-11 NOTE — Telephone Encounter (Signed)
Ok to schedule.  Room :Any   Thanks,  Vista Lawman, MD Gastroenterology and Hepat[facility]

## 2024-05-12 MED ORDER — PEG 3350-KCL-NA BICARB-NACL 420 G PO SOLR: 4000.0000 mL | Freq: Once | ORAL | 0 refills | Status: AC

## 2024-05-12 NOTE — Addendum Note (Signed)
 Addended by: JEANELL GRAEME RAMAN on: 05/12/2024 03:48 PM   Modules accepted: Orders

## 2024-05-12 NOTE — Telephone Encounter (Addendum)
 Spoke with pt. She has been scheduled for 2/17. Aware will send instructions via mychart and mail to her and rx for prep to her pharmacy.

## 2024-05-13 NOTE — Telephone Encounter (Signed)
 Questionnaire from recall, no referral needed

## 2024-06-03 ENCOUNTER — Ambulatory Visit: Admitting: "Endocrinology

## 2024-06-07 ENCOUNTER — Encounter (HOSPITAL_COMMUNITY): Payer: Self-pay

## 2024-06-07 ENCOUNTER — Ambulatory Visit (HOSPITAL_COMMUNITY): Admit: 2024-06-07 | Admitting: Gastroenterology

## 2024-06-07 SURGERY — COLONOSCOPY
Anesthesia: Choice

## 2024-08-10 ENCOUNTER — Ambulatory Visit: Admitting: Internal Medicine

## 2025-01-09 ENCOUNTER — Ambulatory Visit

## 2025-01-13 ENCOUNTER — Ambulatory Visit
